# Patient Record
Sex: Female | Born: 1985 | Race: White | Hispanic: No | Marital: Married | Smoking: Never smoker
Health system: Southern US, Community
[De-identification: ages and names within clinical notes are randomized; demographics above are authoritative.]

## PROBLEM LIST (undated history)

## (undated) DIAGNOSIS — E785 Hyperlipidemia, unspecified: Secondary | ICD-10-CM

## (undated) DIAGNOSIS — I10 Essential (primary) hypertension: Secondary | ICD-10-CM

## (undated) DIAGNOSIS — F32A Depression, unspecified: Secondary | ICD-10-CM

## (undated) DIAGNOSIS — M549 Dorsalgia, unspecified: Secondary | ICD-10-CM

## (undated) DIAGNOSIS — Z8711 Personal history of peptic ulcer disease: Secondary | ICD-10-CM

## (undated) DIAGNOSIS — F909 Attention-deficit hyperactivity disorder, unspecified type: Secondary | ICD-10-CM

## (undated) DIAGNOSIS — R7303 Prediabetes: Secondary | ICD-10-CM

## (undated) DIAGNOSIS — F419 Anxiety disorder, unspecified: Secondary | ICD-10-CM

## (undated) DIAGNOSIS — R519 Headache, unspecified: Secondary | ICD-10-CM

## (undated) DIAGNOSIS — M2550 Pain in unspecified joint: Secondary | ICD-10-CM

## (undated) DIAGNOSIS — D689 Coagulation defect, unspecified: Secondary | ICD-10-CM

## (undated) DIAGNOSIS — D649 Anemia, unspecified: Secondary | ICD-10-CM

## (undated) DIAGNOSIS — K219 Gastro-esophageal reflux disease without esophagitis: Secondary | ICD-10-CM

## (undated) DIAGNOSIS — K5900 Constipation, unspecified: Secondary | ICD-10-CM

## (undated) HISTORY — DX: Anxiety disorder, unspecified: F41.9

## (undated) HISTORY — DX: Gastro-esophageal reflux disease without esophagitis: K21.9

## (undated) HISTORY — DX: Prediabetes: R73.03

## (undated) HISTORY — DX: Anemia, unspecified: D64.9

## (undated) HISTORY — DX: Pain in unspecified joint: M25.50

## (undated) HISTORY — DX: Personal history of peptic ulcer disease: Z87.11

## (undated) HISTORY — DX: Coagulation defect, unspecified: D68.9

## (undated) HISTORY — PX: OTHER SURGICAL HISTORY: SHX169

## (undated) HISTORY — DX: Constipation, unspecified: K59.00

## (undated) HISTORY — DX: Dorsalgia, unspecified: M54.9

## (undated) HISTORY — DX: Depression, unspecified: F32.A

## (undated) HISTORY — DX: Hyperlipidemia, unspecified: E78.5

---

## 2020-07-16 ENCOUNTER — Encounter (HOSPITAL_COMMUNITY): Payer: Self-pay | Admitting: *Deleted

## 2020-07-16 ENCOUNTER — Other Ambulatory Visit: Payer: Self-pay

## 2020-07-16 ENCOUNTER — Emergency Department (HOSPITAL_COMMUNITY)
Admission: EM | Admit: 2020-07-16 | Discharge: 2020-07-17 | Disposition: A | Discharge status: Home / Self Care | Payer: BC Managed Care – PPO | Attending: Emergency Medicine | Admitting: Emergency Medicine

## 2020-07-16 DIAGNOSIS — I10 Essential (primary) hypertension: Secondary | ICD-10-CM | POA: Diagnosis not present

## 2020-07-16 DIAGNOSIS — R55 Syncope and collapse: Secondary | ICD-10-CM | POA: Diagnosis not present

## 2020-07-16 DIAGNOSIS — F17200 Nicotine dependence, unspecified, uncomplicated: Secondary | ICD-10-CM | POA: Insufficient documentation

## 2020-07-16 DIAGNOSIS — R4182 Altered mental status, unspecified: Secondary | ICD-10-CM | POA: Diagnosis present

## 2020-07-16 HISTORY — DX: Essential (primary) hypertension: I10

## 2020-07-16 NOTE — ED Triage Notes (Signed)
The pts husband has arrived his story is different from the paramedic that gave me report  The husbnad is reporting that there was never any pot smoking  He reports that the y were havng drinks outside when the pt just stopped talking  And is not talking to him   She just looks at him and blinks her eys at his request  She has never had these symptoms previously   She still does not talk or move her arms and legs  lmp now

## 2020-07-16 NOTE — ED Triage Notes (Signed)
Pt arrived by gems from a parking lot  The pt and her husband had dinner and drinks  Then went to their car  And smoked pot  Ems was called because the pt started acting strange   The paramedic  Reports  That the pt is non-verbal and she has indicated that the pt is having  Lt sided chest pain cbg 127 iv per ems  The pt would not open her lips fot an oral temp  Ems  Reports that   The pt cannot move her legs and arms  No questions answered

## 2020-07-16 NOTE — ED Triage Notes (Signed)
Husband supposed to be coming

## 2020-07-16 NOTE — ED Triage Notes (Signed)
Has had the covid vaccine

## 2020-07-17 ENCOUNTER — Emergency Department (HOSPITAL_COMMUNITY): Payer: BC Managed Care – PPO

## 2020-07-17 ENCOUNTER — Emergency Department (HOSPITAL_BASED_OUTPATIENT_CLINIC_OR_DEPARTMENT_OTHER): Payer: BC Managed Care – PPO

## 2020-07-17 DIAGNOSIS — R202 Paresthesia of skin: Secondary | ICD-10-CM

## 2020-07-17 DIAGNOSIS — F17200 Nicotine dependence, unspecified, uncomplicated: Secondary | ICD-10-CM | POA: Diagnosis not present

## 2020-07-17 DIAGNOSIS — R4182 Altered mental status, unspecified: Secondary | ICD-10-CM | POA: Diagnosis present

## 2020-07-17 DIAGNOSIS — R55 Syncope and collapse: Secondary | ICD-10-CM | POA: Diagnosis not present

## 2020-07-17 DIAGNOSIS — I10 Essential (primary) hypertension: Secondary | ICD-10-CM | POA: Diagnosis not present

## 2020-07-17 LAB — CBC
HCT: 44.4 % (ref 36.0–46.0)
Hemoglobin: 14.1 g/dL (ref 12.0–15.0)
MCH: 28.4 pg (ref 26.0–34.0)
MCHC: 31.8 g/dL (ref 30.0–36.0)
MCV: 89.3 fL (ref 80.0–100.0)
Platelets: 367 10*3/uL (ref 150–400)
RBC: 4.97 MIL/uL (ref 3.87–5.11)
RDW: 15.6 % — ABNORMAL HIGH (ref 11.5–15.5)
WBC: 10.7 10*3/uL — ABNORMAL HIGH (ref 4.0–10.5)
nRBC: 0 % (ref 0.0–0.2)

## 2020-07-17 LAB — TROPONIN I (HIGH SENSITIVITY)
Troponin I (High Sensitivity): <2 ng/L (ref <18)
Troponin I (High Sensitivity): 5 ng/L (ref <18)

## 2020-07-17 LAB — BASIC METABOLIC PANEL
Anion gap: 15 (ref 5–15)
BUN: 8 mg/dL (ref 6–20)
CO2: 20 mmol/L — ABNORMAL LOW (ref 22–32)
Calcium: 9.7 mg/dL (ref 8.9–10.3)
Chloride: 103 mmol/L (ref 98–111)
Creatinine, Ser: 1.26 mg/dL — ABNORMAL HIGH (ref 0.44–1.00)
GFR, Estimated: 57 mL/min — ABNORMAL LOW (ref >60)
Glucose, Bld: 86 mg/dL (ref 70–99)
Potassium: 3.3 mmol/L — ABNORMAL LOW (ref 3.5–5.1)
Sodium: 138 mmol/L (ref 135–145)

## 2020-07-17 LAB — URINALYSIS, ROUTINE W REFLEX MICROSCOPIC: RBC / HPF: >50 RBC/hpf — ABNORMAL HIGH (ref 0–5)

## 2020-07-17 LAB — RAPID URINE DRUG SCREEN, HOSP PERFORMED
Amphetamines: NOT DETECTED
Barbiturates: NOT DETECTED
Benzodiazepines: NOT DETECTED
Cocaine: NOT DETECTED
Opiates: NOT DETECTED
Tetrahydrocannabinol: POSITIVE — AB

## 2020-07-17 LAB — MAGNESIUM: Magnesium: 1.9 mg/dL (ref 1.7–2.4)

## 2020-07-17 LAB — I-STAT BETA HCG BLOOD, ED (MC, WL, AP ONLY): I-stat hCG, quantitative: <5 m[IU]/mL (ref <5)

## 2020-07-17 LAB — ACETAMINOPHEN LEVEL: Acetaminophen (Tylenol), Serum: <10 ug/mL — ABNORMAL LOW (ref 10–30)

## 2020-07-17 LAB — HEPATIC FUNCTION PANEL
ALT: 23 U/L (ref 0–44)
AST: 24 U/L (ref 15–41)
Albumin: 3.8 g/dL (ref 3.5–5.0)
Alkaline Phosphatase: 84 U/L (ref 38–126)
Bilirubin, Direct: 0.3 mg/dL — ABNORMAL HIGH (ref 0.0–0.2)
Indirect Bilirubin: 0.3 mg/dL (ref 0.3–0.9)
Total Bilirubin: 0.6 mg/dL (ref 0.3–1.2)
Total Protein: 7.1 g/dL (ref 6.5–8.1)

## 2020-07-17 LAB — D-DIMER, QUANTITATIVE: D-Dimer, Quant: 0.59 ug/mL-FEU — ABNORMAL HIGH (ref 0.00–0.50)

## 2020-07-17 LAB — ETHANOL: Alcohol, Ethyl (B): <10 mg/dL (ref <10)

## 2020-07-17 LAB — SALICYLATE LEVEL: Salicylate Lvl: <7 mg/dL — ABNORMAL LOW (ref 7.0–30.0)

## 2020-07-17 MED ORDER — SODIUM CHLORIDE 0.9 % IV BOLUS
1000.0000 mL | Freq: Once | INTRAVENOUS | Status: AC
  Administered 2020-07-17: 1000 mL via INTRAVENOUS

## 2020-07-17 MED ORDER — IOHEXOL 350 MG/ML SOLN
75.0000 mL | Freq: Once | INTRAVENOUS | Status: AC | PRN
  Administered 2020-07-17: 75 mL via INTRAVENOUS

## 2020-07-17 NOTE — Progress Notes (Signed)
VASCULAR LAB    Left lower extremity venous duplex has been performed.  See CV proc for preliminary results.  Gave verbal report to [employer], PA-C  Shelanda Duvall, RVT 07/17/2020, 12:33 PM

## 2020-07-17 NOTE — ED Provider Notes (Signed)
[facility] EMERGENCY DEPARTMENT Provider Note   CSN: [id number] Arrival date & time: 07/16/20  2244     History Chief Complaint  Patient presents with  . Altered Mental Status    Helen Wells is a 35 y.o. female who presents after syncopal episode last night.  Primary concern is patient is nonverbal at this time, points to her head and typed on her phone that she knows what she wants to say, but cannot make her mouth form the words. She is able to communicate clearly through the phone. Collateral history obtained by her husband at the bedside.  He reports that they went out last night to celebrate New Year's Eve, she had 2 drinks, and when they were walking to the car to go home she collapsed suddenly.  Patient was caught by a friend who was standing by, was lowered to the pavement.  Per husband she was unconscious for about a minute prior to EMS arrival.  Since that time she has been unable to speak.  She types on her phone that she has "fuzzy vision", feels that her vision is blurry.  EMS report that patient was smoking marijuana, however this was denied by patient's husband.  Patient does endorse left-sided chest pain when she woke up following her syncopal episode.  Initially patient was unable to move all 4 extremities, was "acting strange".  Patient will follow commands, however is unable to speak.  Patient denies abdominal pain but does endorse some nausea.  Denies emesis, diarrhea.  Patient is currently on her menstrual cycle.  She has an IUD in place.  I personally reviewed the patient's medical records.  She is history of migraines, L5 surgery for herniated disc, history of spinal stenosis.  Patient has history of anti-cardiolipin antibody, following with rheumatology.  She endorses history of syncope secondary to dehydration in the past.  Patient denies recent prolonged travel, immobilization, she does have history of surgery in November 2021,  approximately 1 month ago.  Patient with hormonal IUD.  No history of blood clot, no history of malignancy.  HPI     Past Medical History:  Diagnosis Date  . Hypertension     There are no problems to display for this patient.      OB History   No obstetric history on file.     No family history on file.  Social History   Tobacco Use  . Smoking status: Current Every Day Smoker  . Smokeless tobacco: Never Used  Substance Use Topics  . Alcohol use: Yes    Home Medications Prior to Admission medications   Not on File    Allergies    Patient has no known allergies.  Review of Systems   Review of Systems  Constitutional: Negative.   HENT: Negative.   Eyes: Positive for visual disturbance. Negative for photophobia and pain.  Respiratory: Positive for chest tightness. Negative for cough, shortness of breath and wheezing.   Cardiovascular: Positive for chest pain. Negative for palpitations and leg swelling.  Gastrointestinal: Negative for abdominal pain, constipation, diarrhea, nausea and vomiting.  Genitourinary: Positive for hematuria and vaginal bleeding. Negative for dysuria.       Currently on her period  Neurological: Positive for syncope, weakness and headaches. Negative for light-headedness.       Unable to speak at this time    Physical Exam Updated Vital Signs BP 133/85   Pulse 79   Temp 99 F (37.2 C) (Oral)   Resp  15   Ht 5\' 3"  (1.6 m)   Wt 99.8 kg   LMP 07/16/2020   SpO2 98%   BMI 38.97 kg/m   Physical Exam Vitals and nursing note reviewed.  HENT:     Head: Normocephalic and atraumatic.     Nose: Nose normal.     Mouth/Throat:     Mouth: Mucous membranes are moist.     Palate: No mass.     Pharynx: Oropharynx is clear. Uvula midline. No oropharyngeal exudate or posterior oropharyngeal erythema.     Tonsils: No tonsillar exudate.  Eyes:     General: Vision grossly intact.        Right eye: No discharge.        Left eye: No discharge.      Extraocular Movements: Extraocular movements intact.     Conjunctiva/sclera: Conjunctivae normal.     Pupils: Pupils are equal, round, and reactive to light.     Visual Fields: Right eye visual fields normal and left eye visual fields normal.  Neck:     Trachea: Trachea normal.  Cardiovascular:     Rate and Rhythm: Normal rate and regular rhythm.     Pulses: Normal pulses.          Radial pulses are 2+ on the right side and 2+ on the left side.       Dorsalis pedis pulses are 2+ on the right side and 2+ on the left side.     Heart sounds: Normal heart sounds. No murmur heard.   Pulmonary:     Effort: Pulmonary effort is normal. No prolonged expiration or respiratory distress.     Breath sounds: Examination of the right-lower field reveals rales. Rales present. No wheezing or rhonchi.  Chest:     Chest wall: No lacerations, deformity, swelling, tenderness, crepitus or edema.  Abdominal:     General: Bowel sounds are normal. There is no distension.     Palpations: Abdomen is soft.     Tenderness: There is no abdominal tenderness. There is no guarding or rebound.  Musculoskeletal:        General: No deformity.     Cervical back: Neck supple. No rigidity or crepitus. No pain with movement, spinous process tenderness or muscular tenderness.     Right lower leg: No edema.     Left lower leg: No edema.     Comments: 4/5 grip strength bilaterally.  4/5 plantar - and dorsiflexion bilaterally Patient moving all 4 extremities independently and without difficulty.  Lymphadenopathy:     Cervical: No cervical adenopathy.  Skin:    General: Skin is warm and dry.     Capillary Refill: Capillary refill takes less than 2 seconds.  Neurological:     General: No focal deficit present.     Mental Status: She is alert and oriented to person, place, and time.     Cranial Nerves: Cranial nerves are intact.     Sensory: Sensation is intact.     Motor: Motor function is intact.     Coordination:  Coordination is intact.     Gait: Gait is intact.     Deep Tendon Reflexes:     Reflex Scores:      Bicep reflexes are 1+ on the right side and 1+ on the left side.      Patellar reflexes are 1+ on the right side and 1+ on the left side.    Comments: Patient remains nonverbal at this time, however is  able to communicate via typing phrases out on her phone.   Psychiatric:        Mood and Affect: Mood normal. Affect is flat.     ED Results / Procedures / Treatments   Labs (all labs ordered are listed, but only abnormal results are displayed) Labs Reviewed  BASIC METABOLIC PANEL - Abnormal; Notable for the following components:      Result Value   Potassium 3.3 (*)    CO2 20 (*)    Creatinine, Ser 1.26 (*)    GFR, Estimated 57 (*)    All other components within normal limits  CBC - Abnormal; Notable for the following components:   WBC 10.7 (*)    RDW 15.6 (*)    All other components within normal limits  URINALYSIS, ROUTINE W REFLEX MICROSCOPIC - Abnormal; Notable for the following components:   Color, Urine RED (*)    APPearance TURBID (*)    Glucose, UA   (*)    Value: TEST NOT REPORTED DUE TO COLOR INTERFERENCE OF URINE PIGMENT   Hgb urine dipstick   (*)    Value: TEST NOT REPORTED DUE TO COLOR INTERFERENCE OF URINE PIGMENT   Bilirubin Urine   (*)    Value: TEST NOT REPORTED DUE TO COLOR INTERFERENCE OF URINE PIGMENT   Ketones, ur   (*)    Value: TEST NOT REPORTED DUE TO COLOR INTERFERENCE OF URINE PIGMENT   Protein, ur   (*)    Value: TEST NOT REPORTED DUE TO COLOR INTERFERENCE OF URINE PIGMENT   Nitrite   (*)    Value: TEST NOT REPORTED DUE TO COLOR INTERFERENCE OF URINE PIGMENT   Leukocytes,Ua   (*)    Value: TEST NOT REPORTED DUE TO COLOR INTERFERENCE OF URINE PIGMENT   RBC / HPF >50 (*)    Bacteria, UA FEW (*)    All other components within normal limits  RAPID URINE DRUG SCREEN, HOSP PERFORMED - Abnormal; Notable for the following components:    Tetrahydrocannabinol POSITIVE (*)    All other components within normal limits  ACETAMINOPHEN LEVEL - Abnormal; Notable for the following components:   Acetaminophen (Tylenol), Serum <10 (*)    All other components within normal limits  SALICYLATE LEVEL - Abnormal; Notable for the following components:   Salicylate Lvl <7.0 (*)    All other components within normal limits  HEPATIC FUNCTION PANEL - Abnormal; Notable for the following components:   Bilirubin, Direct 0.3 (*)    All other components within normal limits  D-DIMER, QUANTITATIVE (NOT AT [facility]) - Abnormal; Notable for the following components:   D-Dimer, Quant 0.59 (*)    All other components within normal limits  MAGNESIUM  ETHANOL  I-STAT BETA HCG BLOOD, ED ([facility], [facility], [facility] ONLY)  TROPONIN I (HIGH SENSITIVITY)  TROPONIN I (HIGH SENSITIVITY)    EKG EKG Interpretation  Date/Time:  Friday July 16 2020 22:45:35 EST Ventricular Rate:  112 PR Interval:  188 QRS Duration: 104 QT Interval:  328 QTC Calculation: 447 R Axis:   63 Text Interpretation: Sinus tachycardia with occasional Premature ventricular complexes T wave abnormality, consider inferior ischemia Abnormal ECG Confirmed by Tilden Fossa [phone]) on 07/17/2020 8:17:44 AM   Radiology CT Head Wo Contrast  Result Date: 07/17/2020 CLINICAL DATA:  35 year old female with altered mental status, apparent bilateral weakness after alcohol and marijuana use. EXAM: CT HEAD WITHOUT CONTRAST TECHNIQUE: Contiguous axial images were obtained from the base of the skull through the vertex  without intravenous contrast. COMPARISON:  None. FINDINGS: Brain: Partially empty sella. Normal cerebral volume. No midline shift, ventriculomegaly, mass effect, evidence of mass lesion, intracranial hemorrhage or evidence of cortically based acute infarction. Gray-white matter differentiation is within normal limits throughout the brain. Vascular: No hyperdense vessel or unexpected calcification.  Skull: Negative. Sinuses/Orbits: Visualized paranasal sinuses and mastoids are clear. Other: Visualized orbits and scalp soft tissues are within normal limits. IMPRESSION: Negative noncontrast CT appearance of the brain. Electronically Signed   By: Odessa Fleming M.D.   On: 07/17/2020 09:42   CT Angio Chest PE W/Cm &/Or Wo Cm  Result Date: 07/17/2020 CLINICAL DATA:  35 year old female with altered mental status, loss of speech, bilateral weakness after alcohol use. EXAM: CT ANGIOGRAPHY CHEST WITH CONTRAST TECHNIQUE: Multidetector CT imaging of the chest was performed using the standard protocol during bolus administration of intravenous contrast. Multiplanar CT image reconstructions and MIPs were obtained to evaluate the vascular anatomy. CONTRAST:  84mL OMNIPAQUE IOHEXOL 350 MG/ML SOLN COMPARISON:  Cervical spine CT, portable chest x-ray today. FINDINGS: Cardiovascular: Good contrast bolus timing in the pulmonary arterial tree. No focal filling defect identified in the pulmonary arteries to suggest acute pulmonary embolism. Cardiac size within normal limits. No pericardial effusion. Negative visible aorta. No calcified coronary artery atherosclerosis is evident. Proximal great vessels, proximal vertebral arteries appear to be patent. Mediastinum/Nodes: Negative. Lungs/Pleura: Major airways are patent. Low normal lung volumes with minor lung base atelectasis. Mild elevation of the right hemidiaphragm appears to be normal variation. No pneumothorax or pleural effusion. Upper Abdomen: Absent gallbladder. Negative visible liver, spleen, pancreas, left adrenal gland, kidneys and bowel in the upper abdomen. Small and relatively low-density adrenal nodule is about 10 mm and too small to characterize but benign adrenal adenoma is favored. Musculoskeletal: Negative. Review of the MIP images confirms the above findings. IMPRESSION: 1. Negative for acute pulmonary embolus. Unremarkable CTA appearance of the chest. 2. Small benign  right adrenal adenoma suspected. Electronically Signed   By: Odessa Fleming M.D.   On: 07/17/2020 10:45   CT Cervical Spine Wo Contrast  Result Date: 07/17/2020 CLINICAL DATA:  35 year old female with altered mental status, apparent bilateral weakness after alcohol and marijuana use. EXAM: CT CERVICAL SPINE WITHOUT CONTRAST TECHNIQUE: Multidetector CT imaging of the cervical spine was performed without intravenous contrast. Multiplanar CT image reconstructions were also generated. COMPARISON:  Head CT today. FINDINGS: Alignment: Straightening of cervical lordosis. Cervicothoracic junction alignment is within normal limits. Bilateral posterior element alignment is within normal limits. Skull base and vertebrae: Visualized skull base is intact. No atlanto-occipital dissociation. Bone mineralization is within normal limits. No osseous abnormality identified. Soft tissues and spinal canal: No prevertebral fluid or swelling. No visible canal hematoma. Negative visible noncontrast neck soft tissues. Disc levels:  No degenerative changes. Upper chest: Negative; mild respiratory motion in the upper lungs. Other: Negative visible posterior fossa. IMPRESSION: Negative noncontrast CT appearance of the cervical spine. Electronically Signed   By: Odessa Fleming M.D.   On: 07/17/2020 09:44   DG Chest Portable 1 View  Result Date: 07/17/2020 CLINICAL DATA:  Rales EXAM: PORTABLE CHEST 1 VIEW COMPARISON:  None. FINDINGS: Normal heart size. Normal mediastinal contour. No pneumothorax. No pleural effusion. Lungs appear clear, with no acute consolidative airspace disease and no pulmonary edema. IMPRESSION: No active disease. Electronically Signed   By: Delbert Phenix M.D.   On: 07/17/2020 09:14   VAS [place] LOWER EXTREMITY VENOUS (DVT) ([facility] and [facility] 7a-7p)  Result Date: 07/17/2020  Lower Venous DVT Study Indications: Left leg numbness.  Comparison Study: No prior study Performing Technologist: Sherren Kerns RVS  Examination Guidelines: A complete  evaluation includes B-mode imaging, spectral Doppler, color Doppler, and power Doppler as needed of all accessible portions of each vessel. Bilateral testing is considered an integral part of a complete examination. Limited examinations for reoccurring indications may be performed as noted. The reflux portion of the exam is performed with the patient in reverse Trendelenburg.  +-----+---------------+---------+-----------+----------+--------------+ RIGHTCompressibilityPhasicitySpontaneityPropertiesThrombus Aging +-----+---------------+---------+-----------+----------+--------------+ CFV  Full           Yes      Yes                                 +-----+---------------+---------+-----------+----------+--------------+   +---------+---------------+---------+-----------+----------+--------------+ LEFT     CompressibilityPhasicitySpontaneityPropertiesThrombus Aging +---------+---------------+---------+-----------+----------+--------------+ CFV      Full           Yes      Yes                                 +---------+---------------+---------+-----------+----------+--------------+ SFJ      Full                                                        +---------+---------------+---------+-----------+----------+--------------+ FV Prox  Full                                                        +---------+---------------+---------+-----------+----------+--------------+ FV Mid   Full                                                        +---------+---------------+---------+-----------+----------+--------------+ FV DistalFull                                                        +---------+---------------+---------+-----------+----------+--------------+ PFV      Full                                                        +---------+---------------+---------+-----------+----------+--------------+ POP      Full           Yes      Yes                                  +---------+---------------+---------+-----------+----------+--------------+ PTV      Full                                                        +---------+---------------+---------+-----------+----------+--------------+  PERO     Full                                                        +---------+---------------+---------+-----------+----------+--------------+     Summary: RIGHT: - No evidence of common femoral vein obstruction.  LEFT: - There is no evidence of deep vein thrombosis in the lower extremity.  *See table(s) above for measurements and observations.    Preliminary     Procedures Procedures (including critical care time)  Medications Ordered in ED Medications  sodium chloride 0.9 % bolus 1,000 mL (0 mLs Intravenous Stopped 07/17/20 1200)  iohexol (OMNIPAQUE) 350 MG/ML injection 75 mL (75 mLs Intravenous Contrast Given 07/17/20 1018)    ED Course  I have reviewed the triage vital signs and the nursing notes.  Pertinent labs & imaging results that were available during my care of the patient were reviewed by me and considered in my medical decision making (see chart for details).    MDM Rules/Calculators/A&P                         35 year old female who presents following syncopal episode last night, now nonverbal, unable to speak since her syncopal episode.  History of of anticardiolipin antibody syndrome, spinal stenosis, recent spinal surgery for herniated disc in 05/2020.  The differential for syncope is extensive and includes, but is not limited to: arrythmia (Vtach, SVT, SSS, sinus arrest, AV block, bradycardia), aortic stenosis, AMI, hypertrophic obstructive cardiomyopathy (HOCM), PE, atrial myxoma, pulmonary hypertension, orthostatic hypotension, hypovolemia, drug effect, GB syndrome, micturition, cough, carotid sinus sensitivity, seizure, TIA/CVA, hypoglycemia, vertigo.   Patient tachycardic on intake, remains tachycardic at this time, hypertensive a  138/109.    Physical exam is very reassuring.  Neurologic exam is nonfocal, with the exception of the patient is unable to speak.  She is able to communicate clearly by typing information out on her phone.  She is able to follow commands.  Cardiopulmonary exam is significant only for mild rales in the right lung base.  Patient is not tachycardic at the time of my exam.  Basic labs were obtained in triage, CBC with mild leukocytosis of 10.7, BMP with mild hypokalemia 3.3.  Mild AKI with creatinine of 1.26, no prior value in the system.  Patient is not pregnant.  Initial troponin negative, <2, Delta trop negative, 5.  Given history of syncopal episode, in context of anticardiolipin antibody syndrome, will proceed with D-dimer in addition to substance use labs, magnesium, UA.  We will proceed with chest x-ray, CT head CT cervical spine.  Pending D-dimer may proceed with CT a of the chest as well.  Anxiolytic offered, patient declined at this time.  EKG with sinus tachycardia with PVCs, T wave abnormality, consider inferior ischemia , no STEMI.  CT head, cervical spine negative for acute abnormality.  D-dimer elevated to 0.59, will proceed with CT PE study as well as venous Doppler of the lower extremities.  UDS positive for tetrahydrocannabinoids.  CT PE study negative for acute PE.  Venous Doppler negative for DVT negative bilaterally.  At time of my reevaluation of the patient she is able to speak in full sentences once again, is no longer feeling anxious.  She has ambulated independently to and from the restroom without difficulty.  She  is feeling ready to go home.  Case discussed at length with attending physician, who agrees with plan for discharge home with close PCP follow-up.  Transient mutism likely secondary to stress response, possible conversion disorder.  Will provide contact information for mental health providers as well.  Given reassuring physical exam, laboratory studies, CT  imaging, EKG, and vital signs, no further work-up is warranted in the emergency department at this time.  Recommend very close PCP follow-up.  Helen Wells voiced understanding of her medical evaluation and treatment plan.  Each of her questions were answered to her expressed satisfaction.  Strict return precautions were given.  Patient is well-appearing, stable, and appropriate discharge at this time.  This chart was dictated using voice recognition software, Dragon. Despite the best efforts of this provider to proofread and correct errors, errors may still occur which can change documentation meaning.  Final Clinical Impression(s) / ED Diagnoses Final diagnoses:  Syncope and collapse    Rx / DC Orders ED Discharge Orders    None       Sherrilee GillesSponseller, Jonathen Rathman R, PA-C 07/17/20 1635    Tilden Fossaees, Elizabeth, MD 07/17/20 [phone]

## 2020-07-17 NOTE — Discharge Instructions (Addendum)
You were evaluated in the emergency department today for syncope.  Your physical exam, vital signs, blood work, CT scans, and EKG were all very reassuring.  There are no signs of heart attack or blood clot in your lungs.  No signs of large infections either.  I suspect that your inability to speak for several hours this morning was what is called a "stress response".  This is a psychological response to a physical stressor.  Your neurologic exam, the examination of your brain, is normal, as are your CT scans of your head and your neck.  Recommend you follow-up with your primary care doctor within the next week for reevaluation and monitoring.  Additionally below is contact information for several different counseling providers.  Their specialty will be the most helpful in preventing any recurrence of such a stress response.  Return the emergency department if you develop any chest pain, difficulty breathing, nausea or vomiting that does not stop, or if you pass out again, or develop any other new severe symptoms.     Outpatient Psychiatry and Counseling  [facility]: Mobile Crisis Management 24 hours:  [phone]  [facility] of the [employer] sliding scale fee and walk in schedule: M-F 8am-12pm/1pm-3pm [address]  [place], [place] [address] [phone]  [facility] [address] [place], [place] [address] [phone]  [facility] (Former[facility] [employer])- new patient walk-in appointments available Monday - Friday 8am -3pm.          [address] [place], [place] [address] [phone] or crisis line- [phone]  [facility] Outpatient Services/ Intensive Outpatient Therapy Program [address] [place], [place] [address] [phone]  [facility]                  Crisis Services      [phone] [address]     [place], [place] [address]                 [place]  [facility]   [facility] [phone]. [address] [place], [place] [address]   [employer] of Care          [address]  [place], [place] [address]       [phone]  [facility] [address] [place], [place] [address] [phone]  [facility]    [address]    [place], [place] [address]     [phone]       Andee Poles, MD     [address]     [place] [address]     [phone]       [facility] [address] [place] [address]  [facility]     [address] [place], [place]      [phone]       [facility] Eulogio Ditch, MD [address] [place], [place] [address] [phone]  [facility]     [address]     [place], [place] [address]     [phone]       [facility] [address] [place], [place] [address] [phone] Resources for Temporary Residential Assistance/Crisis Centers  [facility]) M-F 8am-3pm   [address] [place], [place] [address]  [phone] Services include: laundry, barbering, support groups, case management, phone  & computer access, showers, AA/NA mtgs, mental health/substance abuse nurse, job skills class, disability information, VA assistance, spiritual classes, etc.   HOMELESS SHELTERS  [facility], [place]     [facility]   [address] [place]     [phone]              [employer] (women and children)       [address] [place], [place] [address] [phone] [email or web address] for application and process Application Required  [facility] [employer] [facility]   [address]    [place] [address]     [phone]                    [facility] [address] [place], [place]  [address] [phone] application appt.) Application Required  [facility] (women only)    [address]     [place], [place] [address]     [phone]      Intake starts 6pm daily Need valid ID, SSC, & Police report [employer] [address] [place], [place] [phone] Application Required  [employer] (men only)     [address]      [place], [place]     [phone]       [facility] [place] (Pregnant women only) [address] [place], [place] [phone]  [facility]      [address]      [place], [place] [address]     [phone]             [facility] [address] [place], [place] [phone] 90 day commitment/SA/Application process  [facility](men only)     [address]     [place], [place]     [phone]       Check-in at [facility] [address] [place], [place] [address] [phone] Men/Women/Women and Children must be there by 7 pm  [facility] [place], [place] [phone]

## 2020-07-17 NOTE — ED Notes (Signed)
Pt transported to US

## 2020-07-17 NOTE — ED Notes (Addendum)
Pt is able to stand, bear weight with 2x assist, and pivot to bed. Pt is able to make gestures for locations of pain, pain level, height and weight.   Pt gestures that she has vision issues; "fuzzy" per husband( communicated through typing messages on their phone). PEARLA. Peripheral vision intact.   Husband reports recent L5 surgery for herniated disc. Husband also reports hx of migraines.   Pt types "IgM blood clotting disorder" for other pertinent medical hx

## 2020-07-17 NOTE — ED Notes (Signed)
Pt d/c home per MD order. Discharge summary reviewed with pt, pt verbalizes understanding. No s/s of acute distress noted at discharge. Ambulatory off unit. Discharged home with visitor.  

## 2021-08-31 ENCOUNTER — Ambulatory Visit
Admission: EM | Admit: 2021-08-31 | Discharge: 2021-08-31 | Disposition: A | Discharge status: Home / Self Care | Payer: Self-pay

## 2021-08-31 ENCOUNTER — Other Ambulatory Visit: Payer: Self-pay

## 2021-08-31 DIAGNOSIS — M5442 Lumbago with sciatica, left side: Secondary | ICD-10-CM

## 2021-08-31 HISTORY — DX: Essential (primary) hypertension: I10

## 2021-08-31 MED ORDER — CYCLOBENZAPRINE HCL 5 MG PO TABS: 5.0000 mg | ORAL_TABLET | Freq: Two times a day (BID) | ORAL | 0 refills | Status: DC | PRN

## 2021-08-31 MED ORDER — KETOROLAC TROMETHAMINE 30 MG/ML IJ SOLN
30.0000 mg | Freq: Once | INTRAMUSCULAR | Status: AC
  Administered 2021-08-31: 30 mg via INTRAMUSCULAR

## 2021-08-31 MED ORDER — PREDNISONE 10 MG (21) PO TBPK: ORAL_TABLET | Freq: Every day | ORAL | 0 refills | Status: DC

## 2021-08-31 NOTE — ED Triage Notes (Signed)
Pt c/o back surgery that resulted in scar tissue sending her to the pain clinic where she receives steroid injections. States she is unable to be seen at pain clinic recently and is currently having "shaking pain" right now.   States last and first pain clinic was in December.

## 2021-08-31 NOTE — Discharge Instructions (Signed)
You have been prescribed prednisone steroid and a muscle relaxer to help alleviate your pain.  Please follow-up with pain clinic for further evaluation and management.  You may also alternate ice and heat application to affected area.  Please be advised that muscle relaxer can cause drowsiness.

## 2021-08-31 NOTE — ED Provider Notes (Signed)
[facility]    CSN: [id number] Arrival date & time: 08/31/21  1700      History   Chief Complaint Chief Complaint  Patient presents with   Back Pain    HPI Helen Wells is a 36 y.o. female.   Patient presents with left lower back pain that radiates down left posterior leg that has been present for a couple of days.  Patient reports that she has a history of chronic back pain and is followed by pain clinic.  She had a laminectomy and discectomy 1 to 2 years ago.  She reports that she "ripped her scar tissue" in October 2022 by bending over.  She was referred to pain clinic after this occurred and has been getting periodic epidural steroid injections.  She last received epidural steroid injection in December 2022 but has had a flareup over the past few days.  She is not able to see pain clinic until April 2023.  She has been taking over-the-counter pain relievers and previously prescribed Norco with no improvement in pain.  Pain is exacerbated with movement.  Denies urinary frequency, urinary burning, urinary or bowel incontinence, saddle anesthesia.  Denies any apparent injury.   Back Pain  Past Medical History:  Diagnosis Date   Hypertension     There are no problems to display for this patient.   History reviewed. No pertinent surgical history.  OB History   No obstetric history on file.      Home Medications    Prior to Admission medications   Medication Sig Start Date End Date Taking? Authorizing Provider  cyclobenzaprine (FLEXERIL) 5 MG tablet Take 1 tablet (5 mg total) by mouth 2 (two) times daily as needed for muscle spasms. 08/31/21  Yes Keeton Kassebaum, Rolly Salter E, FNP  predniSONE (STERAPRED UNI-PAK 21 TAB) 10 MG (21) TBPK tablet Take by mouth daily. Take 6 tabs by mouth daily  for 2 days, then 5 tabs for 2 days, then 4 tabs for 2 days, then 3 tabs for 2 days, 2 tabs for 2 days, then 1 tab by mouth daily for 2 days 08/31/21  Yes Jamesmichael Shadd, Cody E, FNP   amLODipine (NORVASC) 5 MG tablet Take 7.5 mg by mouth daily. 06/13/21   [provider]  lisinopril (ZESTRIL) 30 MG tablet Take 30 mg by mouth daily. 06/15/21   [provider]    Family History History reviewed. No pertinent family history.  Social History Social History   Tobacco Use   Smoking status: Never   Smokeless tobacco: Never     Allergies   Erythromycin, Mobic [meloxicam], and Tetracyclines & related   Review of Systems Review of Systems Per HPI  Physical Exam Triage Vital Signs ED Triage Vitals  Enc Vitals Group     BP 08/31/21 1728 125/85     Pulse Rate 08/31/21 1728 72     Resp 08/31/21 1728 20     Temp 08/31/21 1728 97.9 F (36.6 C)     Temp Source 08/31/21 1728 Oral     SpO2 08/31/21 1728 99 %     Weight --      Height --      Head Circumference --      Peak Flow --      Pain Score 08/31/21 1730 10     Pain Loc --      Pain Edu? --      Excl. in GC? --    No data found.  Updated Vital Signs  BP 125/85 (BP Location: Left Arm)    Pulse 72    Temp 97.9 F (36.6 C) (Oral)    Resp 20    SpO2 99%   Visual Acuity Right Eye Distance:   Left Eye Distance:   Bilateral Distance:    Right Eye Near:   Left Eye Near:    Bilateral Near:     Physical Exam Constitutional:      General: She is not in acute distress.    Appearance: Normal appearance. She is not toxic-appearing or diaphoretic.  HENT:     Head: Normocephalic and atraumatic.  Eyes:     Extraocular Movements: Extraocular movements intact.     Conjunctiva/sclera: Conjunctivae normal.  Pulmonary:     Effort: Pulmonary effort is normal.  Musculoskeletal:     Cervical back: Normal.     Thoracic back: Normal.     Lumbar back: Tenderness present. No swelling, edema or bony tenderness. Positive left straight leg raise test. Negative right straight leg raise test.     Comments: No tenderness to palpation.  No direct spinal tenderness, crepitus, step-off.  Pain occurs with  range of motion per patient.  Neurological:     General: No focal deficit present.     Mental Status: She is alert and oriented to person, place, and time. Mental status is at baseline.     Deep Tendon Reflexes: Reflexes are normal and symmetric.  Psychiatric:        Mood and Affect: Mood normal.        Behavior: Behavior normal.        Thought Content: Thought content normal.        Judgment: Judgment normal.     UC Treatments / Results  Labs (all labs ordered are listed, but only abnormal results are displayed) Labs Reviewed - No data to display  EKG   Radiology No results found.  Procedures Procedures (including critical care time)  Medications Ordered in UC Medications  ketorolac (TORADOL) 30 MG/ML injection 30 mg (30 mg Intramuscular Given 08/31/21 1746)    Initial Impression / Assessment and Plan / UC Course  I have reviewed the triage vital signs and the nursing notes.  Pertinent labs & imaging results that were available during my care of the patient were reviewed by me and considered in my medical decision making (see chart for details).     Will prescribe prednisone steroid and Flexeril.  Patient was given ketorolac injection in urgent care.  She reports that she has seen improvement in the past with these medications.  Patient to alternate ice and heat application as well.  Patient to follow-up with pain clinic for further evaluation and management.  Do not think that back imaging is necessary at this time given no obvious injury, this being a chronic pain issue, and no direct spinal tenderness.  Discussed return precautions.  Patient verbalized understanding and was agreeable with plan. Final Clinical Impressions(s) / UC Diagnoses   Final diagnoses:  Acute left-sided low back pain with left-sided sciatica     Discharge Instructions      You have been prescribed prednisone steroid and a muscle relaxer to help alleviate your pain.  Please follow-up with pain  clinic for further evaluation and management.  You may also alternate ice and heat application to affected area.  Please be advised that muscle relaxer can cause drowsiness.    ED Prescriptions     Medication Sig Dispense Auth. Provider   predniSONE (STERAPRED UNI-PAK  21 TAB) 10 MG (21) TBPK tablet Take by mouth daily. Take 6 tabs by mouth daily  for 2 days, then 5 tabs for 2 days, then 4 tabs for 2 days, then 3 tabs for 2 days, 2 tabs for 2 days, then 1 tab by mouth daily for 2 days 42 tablet [place], [place], [place]   cyclobenzaprine (FLEXERIL) 5 MG tablet Take 1 tablet (5 mg total) by mouth 2 (two) times daily as needed for muscle spasms. 20 tablet [place], [place] E, [place]      I have reviewed the PDMP during this encounter.   Gustavus Bryant, [place] 08/31/21 1750

## 2021-10-04 ENCOUNTER — Ambulatory Visit: Payer: 59 | Admitting: Family Medicine

## 2021-10-04 ENCOUNTER — Other Ambulatory Visit (HOSPITAL_BASED_OUTPATIENT_CLINIC_OR_DEPARTMENT_OTHER): Payer: Self-pay

## 2021-10-04 ENCOUNTER — Encounter: Payer: Self-pay | Admitting: Family Medicine

## 2021-10-04 VITALS — BP 118/74 | HR 101 | Temp 97.9°F | Resp 16 | Ht 63.0 in | Wt 208.0 lb

## 2021-10-04 DIAGNOSIS — M5116 Intervertebral disc disorders with radiculopathy, lumbar region: Secondary | ICD-10-CM | POA: Diagnosis not present

## 2021-10-04 DIAGNOSIS — D509 Iron deficiency anemia, unspecified: Secondary | ICD-10-CM | POA: Diagnosis not present

## 2021-10-04 DIAGNOSIS — K921 Melena: Secondary | ICD-10-CM

## 2021-10-04 DIAGNOSIS — I10 Essential (primary) hypertension: Secondary | ICD-10-CM | POA: Insufficient documentation

## 2021-10-04 MED ORDER — AMLODIPINE BESYLATE 5 MG PO TABS
5.0000 mg | ORAL_TABLET | Freq: Every day | ORAL | 2 refills | Status: DC
  Filled 2021-10-04 – 2021-12-06 (×4): qty 90, 90d supply, fill #0
  Filled 2022-03-06: qty 90, 90d supply, fill #1
  Filled 2022-06-20: qty 90, 90d supply, fill #2

## 2021-10-04 MED ORDER — LISINOPRIL 30 MG PO TABS
30.0000 mg | ORAL_TABLET | Freq: Every day | ORAL | 2 refills | Status: DC
  Filled 2021-10-04 – 2021-12-06 (×2): qty 90, 90d supply, fill #0
  Filled 2022-03-06: qty 90, 90d supply, fill #1

## 2021-10-04 NOTE — Patient Instructions (Addendum)
Give [place] 2-3 business days to get the results of your labs back.  ? ?Take an over the counter iron supplement (Ferrous sulfate) 1-2 times daily as your stomach will allow ? ?Avoid NSAIDs.  ? ?Call Center for [facility] at [phone] for an appointment. ? ?They are located at [address], [place], [place], [address] (right across the hall from our office). ? ?If you do not hear anything about your referral in the next 1-2 weeks, call our office and ask for an update. ? ?Let us know if you need anything. ?

## 2021-10-04 NOTE — Progress Notes (Signed)
Chief Complaint  ?Patient presents with  ? Establish Care  ? ? ?   New Patient Visit ?SUBJECTIVE: ?HPI: Helen Wells is an 36 y.o.female who is being seen for establishing care. ? ?The patient was previously seen at [facility]. ? ?Hypertension ?Patient presents for hypertension follow up. ?She does monitor home blood pressures. ?Blood pressures ranging on average from 110's/70-80's. ?She is compliant with medications- Norvasc 5 mg/d, lisinopril 30 mg/d. ?Patient has these side effects of medication: none ?She is adhering to a healthy diet overall. ?Exercise: none ?No CP or SOB.  ? 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  She had to stop taking meloxicam in the past.  She was placed on Relafen by her neurosurgery team's office.  She took it for 2 weeks and started to have reflux symptoms and dark tarry stools.  She stopped and her stools are starting to normalize but she has been having dizziness and fatigue.  This seems reminiscent of previous iron deficiency anemia in the past for her.  She was told she was very close to needing some sort of infusion/transfusion but does not remember which. ? ?Patient has a history of a herniated disc.  She is receiving injections from her neurosurgery team which was helping prolong surgery.  After getting new insurance, she needs a team in this area to help.  She does not have any weakness but is having chronic left foot numbness. ? ?Past Medical History:  ?Diagnosis Date  ? Anemia   ? Anemia   ? Anxiety   ? Clotting disorder (HCC)   ? Hyperlipemia   ? Hypertension   ? Hypertension   ? ?Past Surgical History:  ?Procedure Laterality Date  ? Gallbladder Removal 2017    ? lancinectomy and Discectomy 2021    ? Dr Orlie Pollen - Neurosurgeon  ? ? ?Family History  ?Problem Relation Age of Onset  ? Hypertension Mother   ? Arthritis Mother   ? High blood pressure Mother   ? Hypertension Father   ? Birth defects Father   ? High Cholesterol Father   ?  Learning disabilities Father   ? Alzheimer's disease Son   ? ?Allergies  ?Allergen Reactions  ? Bupivacaine-Meloxicam Er   ? Erythromycin   ?  Blind  ? Mobic [Meloxicam]   ?  Stomach Ulcers  ? Tetracyclines & Related   ?  Blind  ? ? ?Current Outpatient Medications:  ?  amLODipine (NORVASC) 5 MG tablet, Take 1 tablet (5 mg total) by mouth daily., Disp: 90 tablet, Rfl: 2 ?  lisinopril (ZESTRIL) 30 MG tablet, Take 1 tablet (30 mg total) by mouth daily., Disp: 90 tablet, Rfl: 2 ? ?OBJECTIVE: ?BP 118/74 (BP Location: Right Arm, Patient Position: Sitting, Cuff Size: Normal)   Pulse (!) 101   Temp 97.9 ?F (36.6 ?C) (Oral)   Resp 16   Ht 5\' 3"  (1.6 m)   Wt 208 lb (94.3 kg)   SpO2 98%   BMI 36.85 kg/m?  ?General:  well developed, well nourished, in no apparent distress ?Skin:  no significant moles, warts, or growths ?Nose:  nares patent, septum midline, mucosa normal ?Throat/Pharynx:  lips and gingiva without lesion; tongue and uvula midline; non-inflamed pharynx; no exudates or postnasal drainage ?Lungs:  clear to auscultation, breath sounds equal bilaterally, no respiratory distress ?Cardio:  regular rate and rhythm, no LE edema or bruits ?Psych: well oriented with normal range of affect and appropriate judgment/insight ? ?  ASSESSMENT/PLAN: ?Essential hypertension ? ?Iron deficiency anemia, unspecified iron deficiency anemia type ? ?Melena - Plan: CBC, IBC + Ferritin ? ?Lumbar disc herniation with radiculopathy - Plan: Ambulatory referral to Physical Medicine Rehab ? ?Chronic, stable.  Continue lisinopril 30 mg daily, amlodipine 5 mg daily.  Counseled on diet and exercise. ?Recurrent, check labs today.  Over-the-counter iron and iron rich foods recommended.  Depending on the levels, may have to get her set up with the hematology team for iron infusions. ?Check labs.  This is starting to resolve but if it returns in the absence of NSAID usage, we will refer her to the gastroenterology team. ?Chronic, stable.  Refer to  [facility] for possible back injections that have been helpful for her. ?Patient should return in around 6 months pending the above ?The patient voiced understanding and agreement to the plan. ? ? ?[name] [place], DO ?10/04/21  ?2:19 PM ? ?

## 2021-10-05 ENCOUNTER — Other Ambulatory Visit (HOSPITAL_BASED_OUTPATIENT_CLINIC_OR_DEPARTMENT_OTHER): Payer: Self-pay

## 2021-10-05 LAB — CBC
HCT: 41.6 % (ref 36.0–46.0)
Hemoglobin: 13.9 g/dL (ref 12.0–15.0)
MCHC: 33.4 g/dL (ref 30.0–36.0)
MCV: 93.5 fl (ref 78.0–100.0)
Platelets: 334 10*3/uL (ref 150.0–400.0)
RBC: 4.45 Mil/uL (ref 3.87–5.11)
RDW: 14.2 % (ref 11.5–15.5)
WBC: 10.6 10*3/uL — ABNORMAL HIGH (ref 4.0–10.5)

## 2021-10-05 LAB — IBC + FERRITIN
Ferritin: 12.6 ng/mL (ref 10.0–291.0)
Iron: 90 ug/dL (ref 42–145)
Saturation Ratios: 18.8 % — ABNORMAL LOW (ref 20.0–50.0)
TIBC: 478.8 ug/dL — ABNORMAL HIGH (ref 250.0–450.0)
Transferrin: 342 mg/dL (ref 212.0–360.0)

## 2021-10-06 ENCOUNTER — Other Ambulatory Visit (HOSPITAL_BASED_OUTPATIENT_CLINIC_OR_DEPARTMENT_OTHER): Payer: Self-pay

## 2021-10-10 IMAGING — CT CT HEAD W/O CM
4 series · 16 of 47 positions shown, 18 images · non-contrast
Comparison: None.

CLINICAL DATA: 34-year-old female with altered mental status,
apparent bilateral weakness after alcohol and marijuana use.

EXAM:
CT HEAD WITHOUT CONTRAST
TECHNIQUE: Contiguous axial images were obtained from the base of the skull
through the vertex without intravenous contrast.

[Series 3: head without · axial · non-contrast · 0.47mm/px · z∈[-80,+30]mm · 6 of 32 slices shown, 8 images]
[im 5/32  brain]
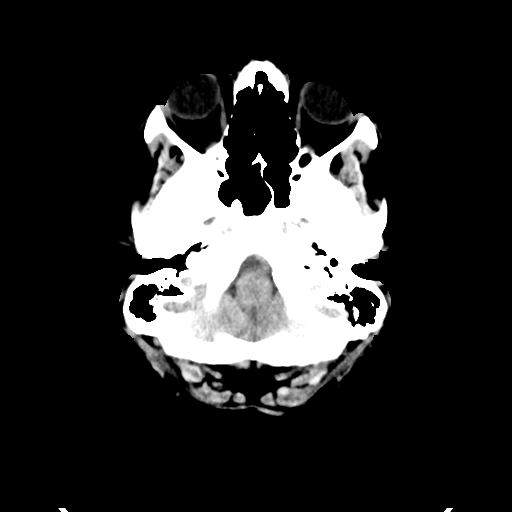
[im 5/32  bone]
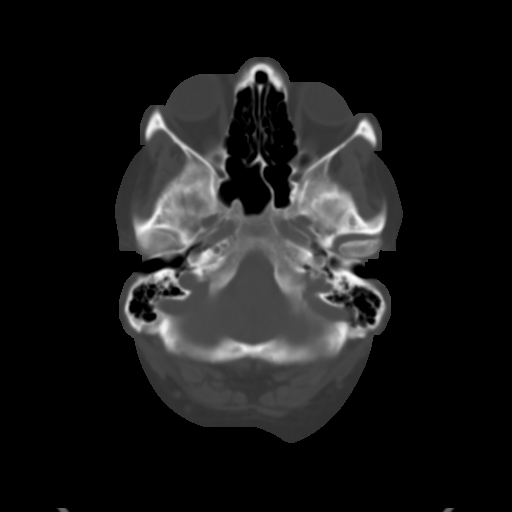
[im 9/32  brain]
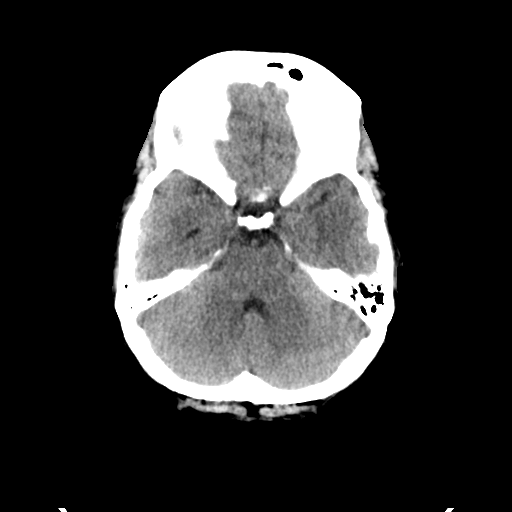
[im 14/32  brain]
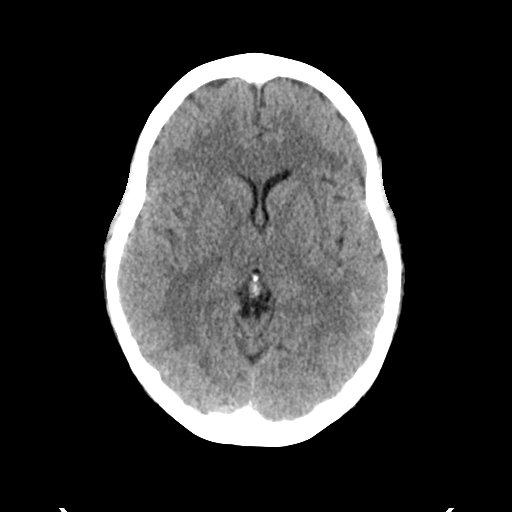
[im 18/32  brain]
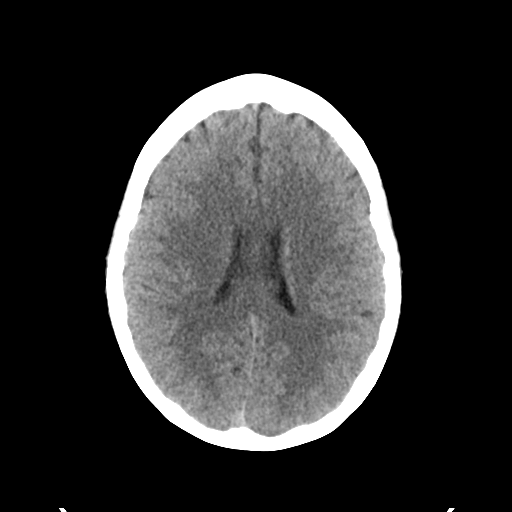
[im 23/32  brain]
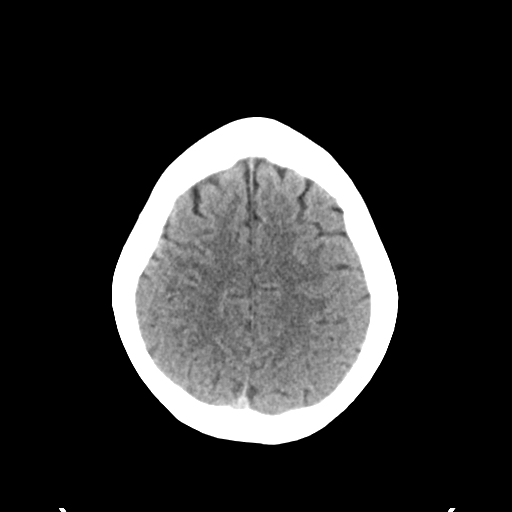
[im 23/32  bone]
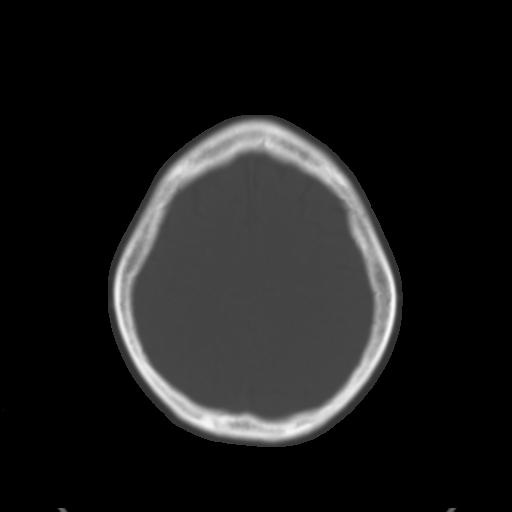
[im 27/32  brain]
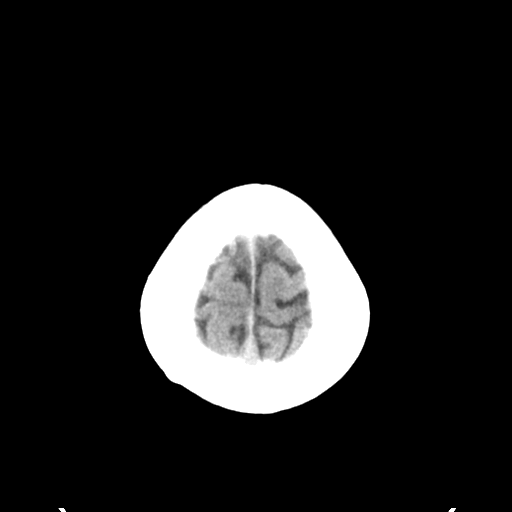

[Series 4: head bone · axial · 0.47mm/px · z∈[-86,-30]mm · 4 of 83 slices shown]
[im 8/83  bone]
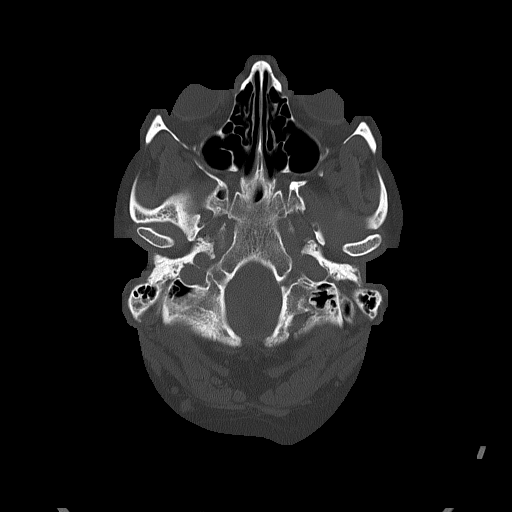
[im 16/83  bone]
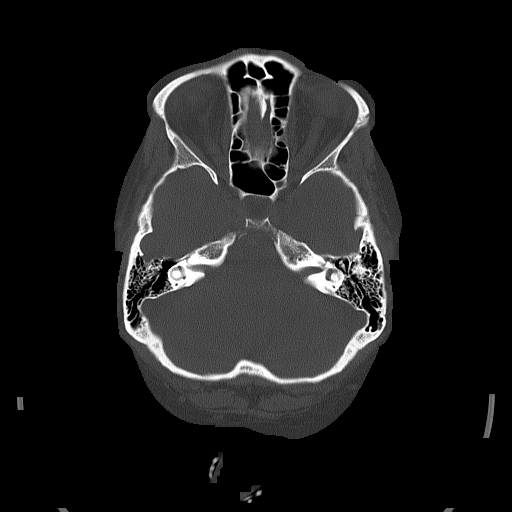
[im 28/83  bone]
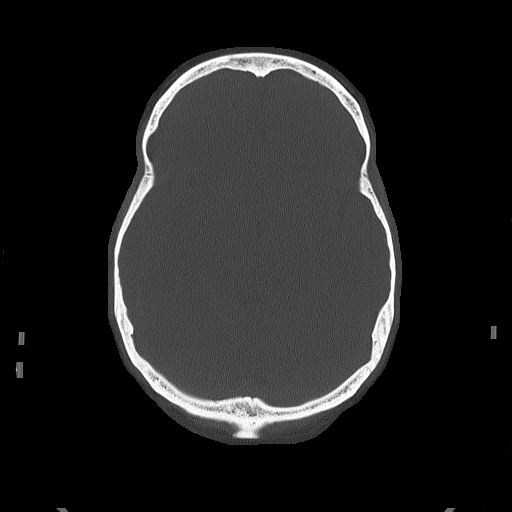
[im 36/83  bone]
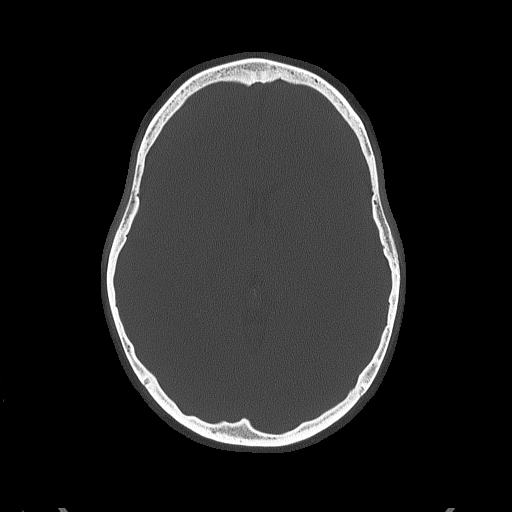

[Series 5: head without cor · coronal · non-contrast · 0.32mm/px · 3 of 73 slices shown]
[im 25/73  brain]
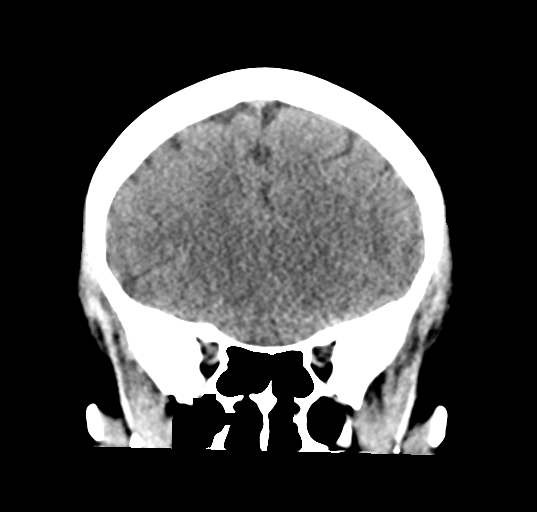
[im 33/73  brain]
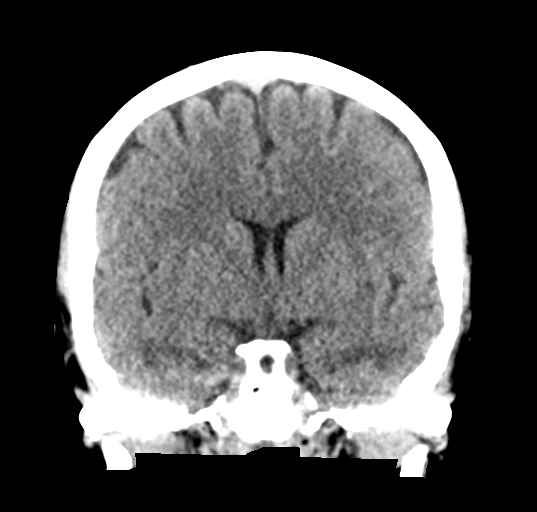
[im 41/73  brain]
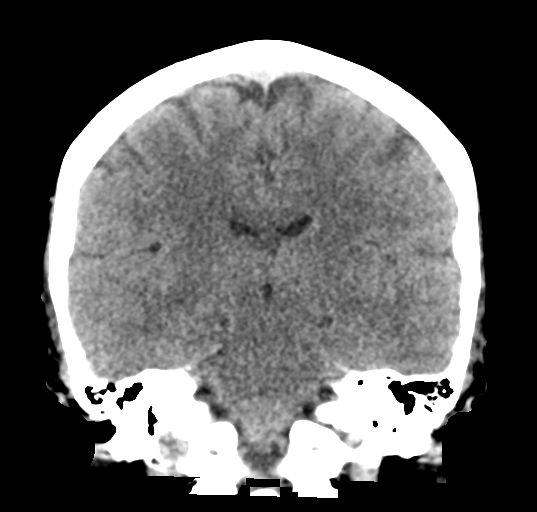

[Series 6: head without sag · sagittal · non-contrast · 0.32mm/px · 3 of 60 slices shown]
[im 20/60  brain]
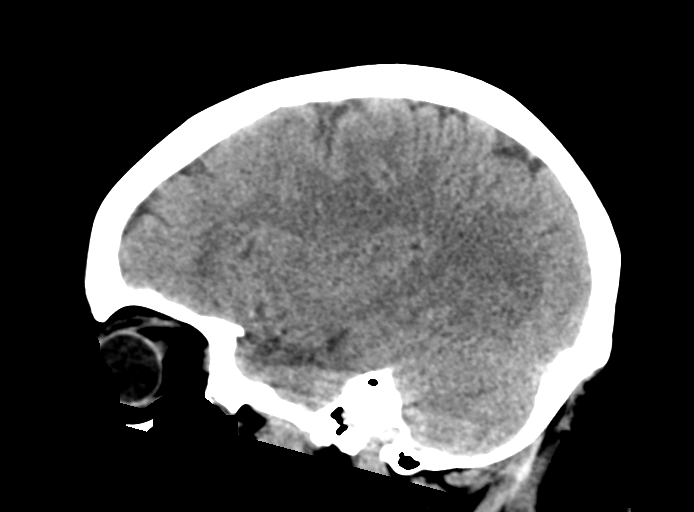
[im 30/60  brain]
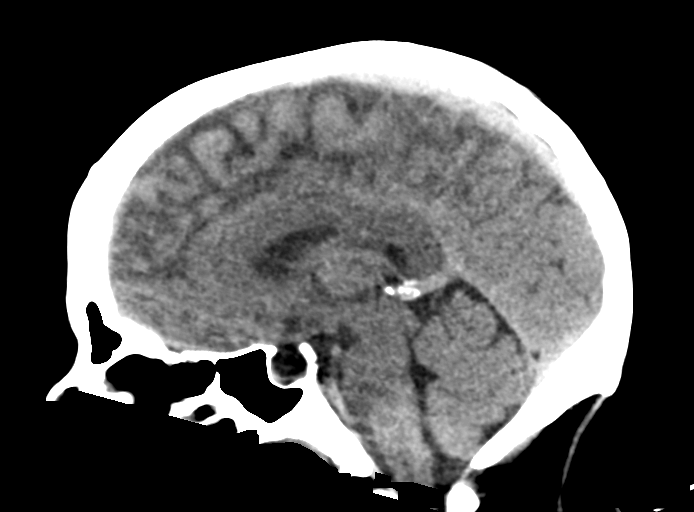
[im 40/60  brain]
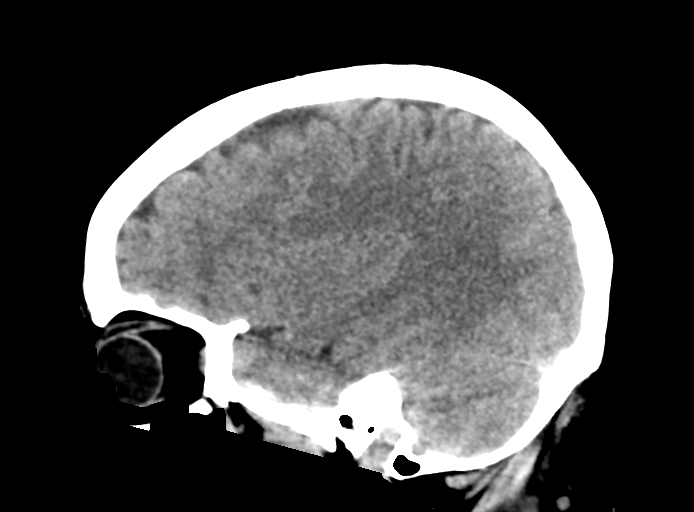

[16 of 47 positions shown; findings below may reference images not displayed]

FINDINGS: Brain: Partially empty sella. Normal cerebral volume. No midline
shift, ventriculomegaly, mass effect, evidence of mass lesion,
intracranial hemorrhage or evidence of cortically based acute
infarction. Gray-white matter differentiation is within normal
limits throughout the brain.

Vascular: No hyperdense vessel or unexpected calcification.

Skull: Negative.

Sinuses/Orbits: Visualized paranasal sinuses and mastoids are clear.

Other: Visualized orbits and scalp soft tissues are within normal
limits.
IMPRESSION: Negative noncontrast CT appearance of the brain.

## 2021-10-17 ENCOUNTER — Encounter: Payer: Self-pay | Admitting: Physical Medicine & Rehabilitation

## 2021-12-07 ENCOUNTER — Other Ambulatory Visit (HOSPITAL_BASED_OUTPATIENT_CLINIC_OR_DEPARTMENT_OTHER): Payer: Self-pay

## 2021-12-13 ENCOUNTER — Encounter: Payer: Self-pay | Admitting: Physical Medicine & Rehabilitation

## 2021-12-13 ENCOUNTER — Encounter: Payer: 59 | Attending: Physical Medicine & Rehabilitation | Admitting: Physical Medicine & Rehabilitation

## 2021-12-13 VITALS — BP 126/91 | HR 75 | Ht 63.0 in | Wt 211.4 lb

## 2021-12-13 DIAGNOSIS — M961 Postlaminectomy syndrome, not elsewhere classified: Secondary | ICD-10-CM | POA: Insufficient documentation

## 2021-12-13 NOTE — Progress Notes (Signed)
Subjective:    Patient ID: Helen Wells, female    DOB: 07-12-1986, 36 y.o.   MRN: [id number] Maribel Elam is a pleasant 36 y.o. female who is status post left L5-S1 laminotomy, medial fasciotomy, and foraminotomy, discectomy on [id number] by Dr. Ancil Linsey. She did very well until squatting 04/24/2021 after riding swings with her husband.  HPI  CC:  Left Lower ext sciatic  36 year old female with a long history of low back pain as well as left lower extremity sciatic pain.  She was treated at [facility] spine but now is transferring to [facility] for her medical care due to change in insurance.  Her back issues have gone on for greater than 2 years.  She had surgery approximately 18 months ago as listed above. 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  She needs to squat and pick up kids on a frequent basis. Tried PT in past which caused some flareup of symptoms.  She has done swimming for exercise in the past but has not done so since last fall with her most recent flareup as well as reherniation.   Has had some relief with medrol dosepack  The patient was considering spinal cord stimulation trial but since her pain has subsided after 3 epidural steroid injections performed earlier this year, she is now holding off on this.  The patient had an epidural steroid injection left L5-S1 transforaminal in November 2022.  She could not get back in on a timely basis and therefore went to [place] and received 2 more epidural injections.  She does not recall the exact level but thinks they were bilateral.  On the second set of injections she did have IV sedation due to discomfort related to procedure. The patient is independent with all self-care and mobility   Pain Inventory Average Pain  3 Pain Right Now 2 My pain is sharp, burning, stabbing, and tingling  In the last 24 hours, has pain interfered with the following? General activity 2 Relation with others 2 Enjoyment of life 2 What TIME of day is your pain at its worst? morning , daytime, and evening Sleep (in general) Fair  Pain is worse with: bending, sitting, and some activites Pain improves with: rest, medication, and injections Relief from Meds: 2  walk without assistance ability to climb steps?  yes do you drive?  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  no  Any changes since last visit?  no    Family History  Problem Relation Age of Onset   Hypertension Mother    Arthritis Mother    High blood pressure Mother    Hypertension Father    Birth defects Father    High Cholesterol Father    Learning disabilities Father    Alzheimer's disease Son    Social History   Socioeconomic History   Marital status: Married    Spouse name: Not on file   Number of children: Not on file   Years of education: Not on file   Highest education level: Not on file  Occupational History   Not on file  Tobacco Use   Smoking status: Never   Smokeless tobacco: Never  Substance and Sexual Activity  Alcohol use: Not Currently    Alcohol/week: 1.0 standard drink    Types: 1 Shots of liquor per week   Drug use: Never   Sexual activity: Not on file  Other Topics Concern   Not on file  Social History Narrative   ** Merged History Encounter **       Social Determinants of Health   Financial Resource Strain: Not on file  Food Insecurity: Not on file  Transportation Needs: Not on file  Physical Activity: Not on file  Stress: Not on file  Social Connections: Not on file   Past Surgical History:  Procedure Laterality Date   Gallbladder Removal 2017     lancinectomy and Discectomy 2021     Dr Orlie Pollen - Neurosurgeon    Past Medical History:  Diagnosis Date   Anemia    Anemia    Anxiety    Clotting disorder (HCC)    Hyperlipemia    Hypertension    Hypertension    BP (!) 126/91   Pulse 75   Ht 5\' 3"  (1.6 m)   Wt 211 lb 6.4 oz (95.9 kg)   SpO2 99%   BMI 37.45 kg/m   Opioid Risk Score:   Fall Risk Score:  `1  Depression screen [facility] 2/9     12/13/2021   10:58 AM 10/04/2021    1:08 PM  Depression screen PHQ 2/9  Decreased Interest 0 2  Down, Depressed, Hopeless 0 0  PHQ - 2 Score 0 2  Altered sleeping 2 3  Tired, decreased energy 2 3  Change in appetite 0 0  Feeling bad or failure about yourself  0 0  Trouble concentrating 0 1  Moving slowly or fidgety/restless 0 0  Suicidal thoughts 0 0  PHQ-9 Score 4 9     Review of Systems  Constitutional: Negative.   HENT: Negative.    Eyes: Negative.   Respiratory: Negative.    Cardiovascular: Negative.   Gastrointestinal: Negative.   Endocrine: Negative.   Genitourinary: Negative.   Musculoskeletal:  Positive for back pain.  Skin: Negative.   Allergic/Immunologic: Negative.   Neurological:        Tingling  Hematological: Negative.   Psychiatric/Behavioral:  The patient is nervous/anxious.   All other systems reviewed and are negative.     Objective:   Physical Exam  Cervical spine has normal range of motion no pain with range of motion Upper extremity range of motion is normal at the shoulders elbows wrist and hands. Lower extremity range of motion normal at the hips knees and ankles with the exception of the left hip during Faber's she experiences sciatic pain down the left lower extremity. Motor strength is 5/5 bilateral deltoid bicep tricep grip hip flexor knee extensor ankle dorsiflexor plantar flexor Negative straight leg raising bilaterally MSK Tenderness palpation bilateral lumbar paraspinal areas around L5-S1. There is no tenderness palpation in the upper lumbar or lower thoracic area. There is some tenderness at the  PSIS area on the left side and she states that pain does radiate downward from there into her leg when this area is pushed. Negative thigh thrust test bilaterally Negative distraction test bilaterally Faber's on the left reproduces some of her sciatic pain but no groin pain, some increased and lower extremity pain.  Good tendon reflexes normal bilateral patellar and right Achilles, absent left Achilles Sensation reduced to pinprick and light touch in the left S1 dermatomal distribution.     Assessment & Plan:  1.  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  She states that while she has required IV sedation on 1 occasion she generally has not.  We discussed that this clinic does not offer IV sedation for epidural procedures. We discussed that if she had a flareup we would probably start with a Medrol Dosepak and then schedule for epidural injection.  Most likely will try S1 transforaminal versus L5-S1 transforaminal. We will request records from the clinic in [place] where she had epidural steroid injections x2. Discussed the importance of physical therapy we will make referral to aquatic therapy given her history of exercising in the water specifically swimming. Discussed with patient agrees with plan

## 2021-12-13 NOTE — Patient Instructions (Signed)

## 2021-12-13 NOTE — Progress Notes (Signed)
Subjective:    Patient ID: Helen Wells, female    DOB: 04/14/1986, 36 y.o.   MRN: [id number]  HPI  Pain Inventory Average Pain 3 Pain Right Now 2 My pain is sharp, burning, stabbing, and tingling  In the last 24 hours, has pain interfered with the following? General activity 2 Relation with others 2 Enjoyment of life 2 What TIME of day is your pain at its worst? morning , daytime, and evening Sleep (in general) Fair  Pain is worse with: bending, sitting, and some activites Pain improves with: rest, medication, and injections Relief from Meds: 2  walk without assistance ability to climb steps?  yes do you drive?  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  no  Any changes since last visit?  no    Family History  Problem Relation Age of Onset   Hypertension Mother    Arthritis Mother    High blood pressure Mother    Hypertension Father    Birth defects Father    High Cholesterol Father    Learning disabilities Father    Alzheimer's disease Son    Social History   Socioeconomic History   Marital status: Married    Spouse name: Not on file   Number of children: Not on file   Years of education: Not on file   Highest education level: Not on file  Occupational History   Not on file  Tobacco Use   Smoking status: Never   Smokeless tobacco: Never  Substance and Sexual Activity   Alcohol use: Not Currently    Alcohol/week: 1.0 standard drink    Types: 1 Shots of liquor per week   Drug use: Never   Sexual activity: Not on file  Other Topics Concern   Not on file  Social History Narrative   ** Merged History Encounter **       Social Determinants of Health   Financial Resource Strain: Not on file  Food Insecurity: Not on file  Transportation Needs: Not on file  Physical Activity: Not on file  Stress: Not on file  Social Connections: Not on file   Past  Surgical History:  Procedure Laterality Date   Gallbladder Removal 2017     lancinectomy and Discectomy 2021     Dr Orlie PollenHnilica - Neurosurgeon   Past Medical History:  Diagnosis Date   Anemia    Anemia    Anxiety    Clotting disorder (HCC)    Hyperlipemia    Hypertension    Hypertension    BP (!) 126/91   Pulse 75   Ht 5\' 3"  (1.6 m)   Wt 211 lb 6.4 oz (95.9 kg)   SpO2 99%   BMI 37.45 kg/m   Opioid Risk Score:   Fall Risk Score:  `1  [facility]     12/13/2021   10:58 AM 10/04/2021    1:08 PM  Depression screen PHQ 2/9  Decreased Interest 0 2  Down, Depressed, Hopeless 0 0  PHQ - 2 Score 0 2  Altered sleeping 2 3  Tired, decreased energy 2 3  Change in appetite 0 0  Feeling bad or failure about yourself  0 0  Trouble concentrating 0 1  Moving slowly or fidgety/restless 0 0  Suicidal thoughts 0 0  PHQ-9 Score 4 9     Review of Systems  Constitutional: Negative.  HENT: Negative.    Eyes: Negative.   Respiratory: Negative.    Cardiovascular: Negative.   Gastrointestinal: Negative.   Endocrine: Negative.   Genitourinary: Negative.   Musculoskeletal:  Positive for back pain.  Skin: Negative.   Allergic/Immunologic: Negative.   Neurological:        Tingling  Hematological: Negative.   Psychiatric/Behavioral:  The patient is nervous/anxious.   All other systems reviewed and are negative.     Objective:   Physical Exam        Assessment & Plan:

## 2022-01-18 ENCOUNTER — Encounter: Payer: Self-pay | Admitting: General Practice

## 2022-01-31 ENCOUNTER — Encounter: Payer: 59 | Attending: Physical Medicine & Rehabilitation | Admitting: Physical Medicine & Rehabilitation

## 2022-01-31 ENCOUNTER — Other Ambulatory Visit: Payer: Self-pay

## 2022-01-31 ENCOUNTER — Ambulatory Visit: Payer: 59 | Attending: Physical Medicine & Rehabilitation | Admitting: Physical Therapy

## 2022-01-31 DIAGNOSIS — M549 Dorsalgia, unspecified: Secondary | ICD-10-CM | POA: Diagnosis not present

## 2022-01-31 DIAGNOSIS — M5417 Radiculopathy, lumbosacral region: Secondary | ICD-10-CM | POA: Diagnosis not present

## 2022-01-31 DIAGNOSIS — M961 Postlaminectomy syndrome, not elsewhere classified: Secondary | ICD-10-CM | POA: Diagnosis not present

## 2022-01-31 DIAGNOSIS — M6281 Muscle weakness (generalized): Secondary | ICD-10-CM | POA: Insufficient documentation

## 2022-01-31 DIAGNOSIS — M5459 Other low back pain: Secondary | ICD-10-CM

## 2022-01-31 NOTE — Therapy (Signed)
OUTPATIENT PHYSICAL THERAPY THORACOLUMBAR EVALUATION   Patient Name: Helen Wells MRN: [id number] DOB:07-27-1985, 36 y.o., female Today's Date: 01/31/2022   PT End of Session - 01/31/22 2103     Visit Number 1    Date for PT Re-Evaluation 04/25/22    Authorization Type [facility]    PT Start Time 1618    PT Stop Time 1658    PT Time Calculation (min) 40 min    Activity Tolerance Patient tolerated treatment well             Past Medical History:  Diagnosis Date   Anemia    Anemia    Anxiety    Clotting disorder (HCC)    Hyperlipemia    Hypertension    Hypertension    Past Surgical History:  Procedure Laterality Date   Gallbladder Removal 2017     lancinectomy and Discectomy 2021     Dr Orlie Pollen - Neurosurgeon   Patient Active Problem List   Diagnosis Date Noted   Essential hypertension 10/04/2021   Iron deficiency anemia 10/04/2021    PCP: Sharlene Dory DO  REFERRING PROVIDER: Dr. Claudette Laws  REFERRING DIAG: M96.1 lumbar post-laminectomy syndrome  Rationale for Evaluation and Treatment Rehabilitation  THERAPY DIAG:  Back pain; weakness ONSET DATE: Jan 2023  SUBJECTIVE:                                                                                                                                                                                           SUBJECTIVE STATEMENT: 3 years of chronic pain L5-S1 surgery lami Nov 2021.; 1 year later tore scar tissue squatting to get to the dryer; Jan '23 HNP same level trying to avoid another surgery; 4 sets of ESI, the last one I had helped a lot; lost 35#;  If had another surgery they would do fusion; recommendation for spinal cord stimulator; previously unable to do PT;  numb left lower lateral lower leg to foot since surgery After back surgery swam but then had to stop  "I'm scheduled for another ESI end of next week b/c I'm scared this will flare me up" PERTINENT HISTORY:  3 years of  chronic pain L5-S1 surgery lami Nov 2021 Had covid 3x  PAIN:  Are you having pain? Yes NPRS scale: 0/10 Pain location: buttock left to feet  Aggravating factors: lifting (doesn't do more than 10#); pull; raking Relieving factors: lying prone; ice    PRECAUTIONS: None  WEIGHT BEARING RESTRICTIONS No  FALLS:  Has patient fallen in last 6 months? 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  PLOF: Independent with basic ADLs  PATIENT GOALS build muscle; avoid surgery; go back to hiking (outdoor stuff); canoe   OBJECTIVE:   DIAGNOSTIC FINDINGS:  MRI HNP L5-S1  PATIENT SURVEYS:  FOTO 48%  SCREENING FOR RED FLAGS: Bowel or bladder incontinence: No  SENSATION: Light touch: Impaired left lower leg to foot   POSTURE: No Significant postural limitations   LUMBAR ROM:  Extensive movement testing not performed secondary to fearfulness of physical activity Flexion to 50 degrees, extension 10 degrees, sidebending left and right 30 degrees Able to lie prone (pain relieving) TRUNK STRENGTH: Grossly 4/5 with decreased lower abdominal, lumbar multifidi, left glute med strength  LOWER EXTREMITY ROM:   WFLs   LOWER EXTREMITY MMT:  No motor loss in left LE  FUNCTIONAL TESTS:  Able to single leg stand on left with min compensations  GAIT:  Comments: WFLs    TODAY'S TREATMENT  Treatment plan   PATIENT EDUCATION:  Education details: treatment plan Person educated: Patient Education method: Explanation Education comprehension: verbalized understanding   HOME EXERCISE PROGRAM: Aquatic program to be initiated  ASSESSMENT:  CLINICAL IMPRESSION: Patient is a 36 y.o. female who was seen today for physical therapy evaluation and treatment for lumbar HNP of L5-S1.  She had  previous lami surgery in Nov 2021 with a re-rupture of the same disc in Jan 2023.  She has had multiple ESI with the last one providing excellent pain relief.  She is very fearful of physical activity but feels she needs to build strength in order to avoid having another surgery and possibly return to outdoor recreational activities.  High symptom irritability therefore aquatic PT recommended.     OBJECTIVE IMPAIRMENTS decreased activity tolerance, decreased strength, impaired perceived functional ability, and pain.   ACTIVITY LIMITATIONS carrying, lifting, bending, and squatting  PARTICIPATION LIMITATIONS: community activity, occupation, and yard work  PERSONAL FACTORS Past/current experiences, Time since onset of injury/illness/exacerbation, and 1 comorbidity: previous back surgery  are also affecting patient's functional outcome  Patient lives and works in [place] so we discussed 1x/week for 6-8 sessions   REHAB POTENTIAL: Good  CLINICAL DECISION MAKING: Stable/uncomplicated  EVALUATION COMPLEXITY: Low   GOALS: Goals reviewed with patient? Yes  SHORT TERM GOALS=LTGs    LONG TERM GOALS: Target date: 04/25/2022  The patient will be independent in a safe self progression of aquatic exercise program to promote further recovery of function   Baseline:  Goal status: INITIAL  2.  The patient will report  pain levels 4/10 with home and work functional activities Goal status:  INITIAL  3.  Trunk strength grossly 4+/5 needed for better tolerance of home and work ADLs Baseline:  Goal status: INITIAL  4.  The patient will have improved FOTO score to    64%   indicating improved function with less pain  Baseline:  Goal status: INITIAL    PLAN: PT FREQUENCY: 1x/week  PT DURATION: 12 weeks  6-8 sessions for independence in aquatic ex program (pt commuting from [place])  PLANNED INTERVENTIONS: Therapeutic exercises, Therapeutic activity, Neuromuscular re-education, Balance  training, Gait training, Patient/Family education, Self Care, Joint mobilization, Aquatic Therapy, Dry Needling, Electrical stimulation, Spinal mobilization, Cryotherapy, Moist heat, Ultrasound, Ionotophoresis 4mg /ml Dexamethasone, and Manual therapy.  PLAN FOR NEXT SESSION: low intensity aquatic ex secondary to fearfulness of exacerbation of HNP symptoms (extension biased ex's)  , PT 01/31/22  9:30 PM Phone: [phone] Fax: [phone]

## 2022-02-06 ENCOUNTER — Telehealth: Payer: Self-pay

## 2022-02-06 ENCOUNTER — Telehealth: Payer: Self-pay | Admitting: Physical Medicine & Rehabilitation

## 2022-02-06 NOTE — Telephone Encounter (Signed)
Patient called to see if she can get a steroid dose pack. She stated sometimes if she takes the dose pack, she doesn't need the injection. Her injection is scheduled for 03/23/22. Please advise

## 2022-02-06 NOTE — Telephone Encounter (Signed)
Patient missed her follow up 7/18 states she would like back injection please advise what injection we may schedule her to have.

## 2022-02-08 ENCOUNTER — Other Ambulatory Visit (HOSPITAL_BASED_OUTPATIENT_CLINIC_OR_DEPARTMENT_OTHER): Payer: Self-pay

## 2022-02-08 ENCOUNTER — Ambulatory Visit: Payer: 59 | Admitting: Medical

## 2022-02-08 ENCOUNTER — Encounter: Payer: Self-pay | Admitting: Medical

## 2022-02-08 VITALS — BP 126/89 | HR 74 | Resp 20 | Ht 63.0 in | Wt 207.0 lb

## 2022-02-08 DIAGNOSIS — R21 Rash and other nonspecific skin eruption: Secondary | ICD-10-CM | POA: Diagnosis not present

## 2022-02-08 MED ORDER — PERMETHRIN 5 % EX CREA
TOPICAL_CREAM | CUTANEOUS | 0 refills | Status: DC
  Filled 2022-02-08: qty 60, 7d supply, fill #0

## 2022-02-08 NOTE — Patient Instructions (Signed)
Recent small papular eruptions in right thigh, elbow and left flank.  Extent of distribution does not match classic scabies but do understand your concerns.  Go ahead and prescribe Elimite cream.  We will will see how you do and ask for update in 7 days.  Update sooner if signs/symptoms change.  If not much improvement by 7 days consider short course of Medrol taper and topical triamcinolone to cover allergic reaction.  Follow-up 7 days or sooner if needed.

## 2022-02-08 NOTE — Progress Notes (Signed)
Subjective:    Patient ID: Helen Wells, female    DOB: Jun 29, 1986, 36 y.o.   MRN: [id number]  HPI  Pt has 5 bumps on her rt thigh, one on her rt elbow and one  left flank.   Pt has concern for scabies. 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  One of children she works with is homeless with small bumps.   Area do itch.     Review of Systems  Constitutional:  Negative for chills, fatigue and fever.  Respiratory:  Negative for cough, chest tightness, shortness of breath and wheezing.   Cardiovascular:  Negative for chest pain and palpitations.  Skin:  Positive for rash.  Hematological:  Negative for adenopathy. Does not bruise/bleed easily.    Past Medical History:  Diagnosis Date   Anemia    Anemia    Anxiety    Clotting disorder (HCC)    Hyperlipemia    Hypertension    Hypertension      Social History   Socioeconomic History   Marital status: Married    Spouse name: Not on file   Number of children: Not on file   Years of education: Not on file   Highest education level: Not on file  Occupational History   Not on file  Tobacco Use   Smoking status: Never   Smokeless tobacco: Never  Substance and Sexual Activity   Alcohol use: Not Currently    Alcohol/week: 1.0 standard drink of alcohol    Types: 1 Shots of liquor per week   Drug use: Never   Sexual activity: Not on file  Other Topics Concern   Not on file  Social History Narrative   ** Merged History Encounter **       Social Determinants of Health   Financial Resource Strain: Not on file  Food Insecurity: Not on file  Transportation Needs: Not on file  Physical Activity: Not on file  Stress: Not on file  Social Connections: Not on file  Intimate Partner Violence: Not on file    Past Surgical History:  Procedure Laterality Date   Gallbladder Removal 2017     lancinectomy and Discectomy 2021     Dr Orlie Pollen - Neurosurgeon    Family History  Problem Relation Age of Onset    Hypertension Mother    Arthritis Mother    High blood pressure Mother    Hypertension Father    Birth defects Father    High Cholesterol Father    Learning disabilities Father    Alzheimer's disease Son     Allergies  Allergen Reactions   Bupivacaine-Meloxicam Er     Other reaction(s): Unknown   Erythromycin     Blind Other reaction(s): Other 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 Other reaction(s): Other Informed by her MD not to take any Tetracyclines. Informed by her MD not to take any Tetracyclines. Blind    Erythromycin Base     Other reaction(s): Other (See Comments) Other    Macrolides And Ketolides     Other reaction(s): Other (See Comments), Other (See Comments) Blindness, Pseudotumor Cerebri  Blindness, Pseudotumor Cerebri      Current Outpatient Medications on File Prior to  Visit  Medication Sig Dispense Refill   amLODipine (NORVASC) 5 MG tablet Take 1 tablet (5 mg total) by mouth daily. 90 tablet 2   lisinopril (ZESTRIL) 30 MG tablet Take 1 tablet (30 mg total) by mouth daily. 90 tablet 2   No current facility-administered medications on file prior to visit.    BP 126/89 (BP Location: Left Arm, Patient Position: Sitting, Cuff Size: Normal)   Pulse 74   Resp 20   Ht 5\' 3"  (1.6 m)   Wt 207 lb (93.9 kg)   SpO2 100%   BMI 36.67 kg/m        Objective:   Physical Exam  General- No acute distress. Pleasant patient. Neck- Full range of motion, no jvd Lungs- Clear, even and unlabored. Heart- regular rate and rhythm. Neurologic- CNII- XII grossly intact.  Skin-right thigh group of small scattered papules numbering 5.  Right elbow 1 papule and left flank 1 papule.  No vesicles seen.       Assessment & Plan:    Patient Instructions  Recent small papular eruptions in right thigh, elbow and left flank.  Extent of distribution does not match classic scabies but do understand your concerns.  Go ahead and prescribe Elimite cream.  We will will see how you do and ask for update in 7 days.  Update sooner if signs/symptoms change.  If not much improvement by 7 days consider short course of Medrol taper and topical triamcinolone to cover allergic reaction.  Follow-up 7 days or sooner if needed.   , PA-C

## 2022-02-09 ENCOUNTER — Other Ambulatory Visit (HOSPITAL_BASED_OUTPATIENT_CLINIC_OR_DEPARTMENT_OTHER): Payer: Self-pay

## 2022-02-09 MED ORDER — METHYLPREDNISOLONE 4 MG PO TBPK
ORAL_TABLET | ORAL | 0 refills | Status: DC
  Filled 2022-02-09: qty 21, 6d supply, fill #0

## 2022-02-09 NOTE — Addendum Note (Signed)
Addended by: Sharlet Salina on: 02/09/2022 01:31 PM   Modules accepted: Orders

## 2022-02-09 NOTE — Telephone Encounter (Signed)
Medrol dose pack sent to pharmacy. Patient notified

## 2022-03-07 ENCOUNTER — Other Ambulatory Visit (HOSPITAL_BASED_OUTPATIENT_CLINIC_OR_DEPARTMENT_OTHER): Payer: Self-pay

## 2022-03-08 ENCOUNTER — Other Ambulatory Visit (HOSPITAL_BASED_OUTPATIENT_CLINIC_OR_DEPARTMENT_OTHER): Payer: Self-pay

## 2022-03-10 ENCOUNTER — Encounter (HOSPITAL_BASED_OUTPATIENT_CLINIC_OR_DEPARTMENT_OTHER): Payer: Self-pay | Admitting: Physical Therapy

## 2022-03-10 ENCOUNTER — Ambulatory Visit (HOSPITAL_BASED_OUTPATIENT_CLINIC_OR_DEPARTMENT_OTHER): Payer: 59 | Attending: Physical Medicine & Rehabilitation | Admitting: Physical Therapy

## 2022-03-10 DIAGNOSIS — M6281 Muscle weakness (generalized): Secondary | ICD-10-CM | POA: Insufficient documentation

## 2022-03-10 DIAGNOSIS — M5417 Radiculopathy, lumbosacral region: Secondary | ICD-10-CM | POA: Insufficient documentation

## 2022-03-10 DIAGNOSIS — M5459 Other low back pain: Secondary | ICD-10-CM | POA: Insufficient documentation

## 2022-03-10 NOTE — Therapy (Signed)
OUTPATIENT PHYSICAL THERAPY THORACOLUMBAR TREATMENT NOTE   Patient Name: Helen Wells MRN: [id number] DOB:04-01-1986, 36 y.o., female Today's Date: 03/10/2022   PT End of Session - 03/10/22 1521     Visit Number 2    Date for PT Re-Evaluation 04/25/22    Authorization Type [facility]    PT Start Time 1520    PT Stop Time 1600    PT Time Calculation (min) 40 min    Activity Tolerance Patient tolerated treatment well    Behavior During Therapy [facility] for tasks assessed/performed             Past Medical History:  Diagnosis Date   Anemia    Anemia    Anxiety    Clotting disorder (HCC)    Hyperlipemia    Hypertension    Hypertension    Past Surgical History:  Procedure Laterality Date   Gallbladder Removal 2017     lancinectomy and Discectomy 2021     Dr Orlie Pollen - Neurosurgeon   Patient Active Problem List   Diagnosis Date Noted   Essential hypertension 10/04/2021   Iron deficiency anemia 10/04/2021    PCP: Sharlene Dory DO  REFERRING PROVIDER: Dr. Claudette Laws  REFERRING DIAG: M96.1 lumbar post-laminectomy syndrome  Rationale for Evaluation and Treatment Rehabilitation  THERAPY DIAG:  Back pain; weakness ONSET DATE: Jan 2023  SUBJECTIVE:                                                                                                                                                                                           SUBJECTIVE STATEMENT: Pt reports she is nervous about session.  "I'm scheduled for another ESI on 9/7" " I'm scared PT will flare me up"  PERTINENT HISTORY:  3 years of chronic pain L5-S1 surgery lami Nov 2021 Had covid 3x  PAIN:  Are you having pain? Yes NPRS scale: 3/10 Pain location: Lt low back  Aggravating factors: lifting (doesn't do more than 10#); pull; raking; prolonged sitting Relieving factors: lying prone; ice    PRECAUTIONS: None  WEIGHT BEARING RESTRICTIONS No  FALLS:  Has patient fallen in  last 6 months? No  LIVING ENVIRONMENT: Lives with: lives with their family Lives in: House/apartment  Has following equipment at home: None  OCCUPATION: own 2 businesses Systems analyst, summer camp; Location manager for mental health  PLOF: Independent with basic ADLs  PATIENT GOALS build muscle; avoid surgery; go back to hiking (outdoor stuff); canoe   OBJECTIVE:   DIAGNOSTIC FINDINGS:  MRI HNP L5-S1  PATIENT SURVEYS:  FOTO 48%  SCREENING FOR RED FLAGS: Bowel or bladder incontinence:  No  SENSATION: Light touch: Impaired left lower leg to foot   POSTURE: No Significant postural limitations   LUMBAR ROM:  Extensive movement testing not performed secondary to fearfulness of physical activity Flexion to 50 degrees, extension 10 degrees, sidebending left and right 30 degrees Able to lie prone (pain relieving) TRUNK STRENGTH: Grossly 4/5 with decreased lower abdominal, lumbar multifidi, left glute med strength  LOWER EXTREMITY ROM:   WFLs   LOWER EXTREMITY MMT:  No motor loss in left LE  FUNCTIONAL TESTS:  Able to single leg stand on left with min compensations  GAIT:  Comments: WFLs    TODAY'S TREATMENT  Pt seen for aquatic therapy today.  Treatment took place in water 3.5-4.75 ft in depth at the [facility] pool. Temp of water was 91.  Pt entered/exited the pool via stairs independently with bilat rail.  * Pt shown spinal pressure with different positions chart prior to entering water * without support:  forward/ backward gait, side stepping with straight knees and increased step height * forward / backward marching with/without noodle * straddling noodle with LE suspended in deeper water:  cycling, hip abdct/add; cc ski * noodle under arms at chest with gentle flutter; legs neutral and suspended for spine decompression * holding wall:  hip abdct/ add; hip ext (gentle range)  * back against wall, knee to chest with blue noodle assist;   noodle stomp (hip flexion to/from neutral and hip abdct with knee flexion to/from neutral)   Pt requires the buoyancy and hydrostatic pressure of water for support, and to offload joints by unweighting joint load by at least 50 % in navel deep water and by at least 75-80% in chest to neck deep water.  Viscosity of the water is needed for resistance of strengthening. Water current perturbations provides challenge to standing balance requiring increased core activation.  PATIENT EDUCATION:  Education details: aquatic exercise intro Person educated: Patient Education method: Explanation Education comprehension: verbalized understanding   HOME EXERCISE PROGRAM: Verbally assigned decompression (suspended from noodle) in deeper water; walking in chest deep water (forward, backward, side stepping); gentle noodle stomp with back against wall.    ASSESSMENT:  CLINICAL IMPRESSION: Holding noodle with gait increased pressure in back; prefers arms at side.  Too much hip abdct increases "pinching feeling" at L low back as well as prone float with noodle with legs are at surface.  May benefit from Lt psoas release and gentle hip flexor stretch.  Reps of exercises kept to less than 10 and within tolerance. Encouraged pt to trial similar exercises at her local pool and report back next visit, as well as ice low back today as preventative measure.   Will progress aquatics gently as tolerated.  Goals are ongoing.      OBJECTIVE IMPAIRMENTS decreased activity tolerance, decreased strength, impaired perceived functional ability, and pain.   ACTIVITY LIMITATIONS carrying, lifting, bending, and squatting  PARTICIPATION LIMITATIONS: community activity, occupation, and yard work  PERSONAL FACTORS Past/current experiences, Time since onset of injury/illness/exacerbation, and 1 comorbidity: previous back surgery  are also affecting patient's functional outcome  Patient lives and works in [place] so we discussed  1x/week for 6-8 sessions   REHAB POTENTIAL: Good  CLINICAL DECISION MAKING: Stable/uncomplicated  EVALUATION COMPLEXITY: Low   GOALS: Goals reviewed with patient? Yes  SHORT TERM GOALS=LTGs    LONG TERM GOALS: Target date: 04/25/2022  The patient will be independent in a safe self progression of aquatic exercise program to promote further recovery  of function   Baseline:  Goal status: INITIAL  2.  The patient will report  pain levels 4/10 with home and work functional activities Goal status:  INITIAL  3.  Trunk strength grossly 4+/5 needed for better tolerance of home and work ADLs Baseline:  Goal status: INITIAL  4.  The patient will have improved FOTO score to    64%   indicating improved function with less pain  Baseline:  Goal status: INITIAL    PLAN: PT FREQUENCY: 1x/week  PT DURATION: 12 weeks  6-8 sessions for independence in aquatic ex program (pt commuting from [place])  PLANNED INTERVENTIONS: Therapeutic exercises, Therapeutic activity, Neuromuscular re-education, Balance training, Gait training, Patient/Family education, Self Care, Joint mobilization, Aquatic Therapy, Dry Needling, Electrical stimulation, Spinal mobilization, Cryotherapy, Moist heat, Ultrasound, Ionotophoresis 4mg /ml Dexamethasone, and Manual therapy.  PLAN FOR NEXT SESSION: assess response to low intensity aquatic ex secondary to fearfulness of exacerbation of HNP symptoms (extension biased ex's)  , PTA 03/10/22 4:21 PM

## 2022-03-17 ENCOUNTER — Ambulatory Visit: Payer: 59 | Attending: Physical Medicine & Rehabilitation | Admitting: Physical Therapy

## 2022-03-17 ENCOUNTER — Encounter: Payer: Self-pay | Admitting: Physical Therapy

## 2022-03-17 DIAGNOSIS — M6281 Muscle weakness (generalized): Secondary | ICD-10-CM | POA: Insufficient documentation

## 2022-03-17 DIAGNOSIS — M5459 Other low back pain: Secondary | ICD-10-CM | POA: Diagnosis present

## 2022-03-17 DIAGNOSIS — M5417 Radiculopathy, lumbosacral region: Secondary | ICD-10-CM | POA: Diagnosis present

## 2022-03-17 NOTE — Therapy (Signed)
OUTPATIENT PHYSICAL THERAPY THORACOLUMBAR TREATMENT NOTE   Patient Name: Helen Wells MRN: [id number] DOB:[birth year], 36 y.o., female Today's Date: 03/17/2022   PT End of Session - 03/17/22 2121     Visit Number 3    Date for PT Re-Evaluation 04/25/22    Authorization Type [facility]    PT Start Time 1345    PT Stop Time 1430    PT Time Calculation (min) 45 min    Activity Tolerance Patient tolerated treatment well    Behavior During Therapy [facility] for tasks assessed/performed              Past Medical History:  Diagnosis Date   Anemia    Anemia    Anxiety    Clotting disorder (HCC)    Hyperlipemia    Hypertension    Hypertension    Past Surgical History:  Procedure Laterality Date   Gallbladder Removal 2017     lancinectomy and Discectomy 2021     Dr Orlie Pollen - Neurosurgeon   Patient Active Problem List   Diagnosis Date Noted   Essential hypertension 10/04/2021   Iron deficiency anemia 10/04/2021    PCP: Sharlene Dory DO  REFERRING PROVIDER: Dr. Claudette Laws  REFERRING DIAG: M96.1 lumbar post-laminectomy syndrome  Rationale for Evaluation and Treatment Rehabilitation  THERAPY DIAG:  Back pain; weakness ONSET DATE: Jan 2023  SUBJECTIVE:                                                                                                                                                                                           SUBJECTIVE STATEMENT: I am doing ok. I was really sore in my back after last session but it was better in a few days.   PERTINENT HISTORY:  3 years of chronic pain L5-S1 surgery lami Nov 2021 Had covid 3x  PAIN:  Are you having pain? Yes NPRS scale: 3/10 Pain location: Lt low back  Aggravating factors: lifting (doesn't do more than 10#); pull; raking; prolonged sitting Relieving factors: lying prone; ice    PRECAUTIONS: None  WEIGHT BEARING RESTRICTIONS No  FALLS:  Has patient fallen in last 6 months?  No  LIVING ENVIRONMENT: Lives with: lives with their family Lives in: House/apartment  Has following equipment at home: None  OCCUPATION: own 2 businesses Systems analyst, summer camp; Location manager for mental health  PLOF: Independent with basic ADLs  PATIENT GOALS build muscle; avoid surgery; go back to hiking (outdoor stuff); canoe   OBJECTIVE:   DIAGNOSTIC FINDINGS:  MRI HNP L5-S1  PATIENT SURVEYS:  FOTO 48%  SCREENING FOR RED FLAGS: Bowel or bladder  incontinence: No  SENSATION: Light touch: Impaired left lower leg to foot   POSTURE: No Significant postural limitations   LUMBAR ROM:  Extensive movement testing not performed secondary to fearfulness of physical activity Flexion to 50 degrees, extension 10 degrees, sidebending left and right 30 degrees Able to lie prone (pain relieving) TRUNK STRENGTH: Grossly 4/5 with decreased lower abdominal, lumbar multifidi, left glute med strength  LOWER EXTREMITY ROM:   WFLs   LOWER EXTREMITY MMT:  No motor loss in left LE  FUNCTIONAL TESTS:  Able to single leg stand on left with min compensations  GAIT:  Comments: WFLs    TODAY'S TREATMENT   03/17/22:Pt seen for aquatic therapy today.  Treatment took place in water 3.5-4.75 ft in depth at the [facility] pool. Temp of water was 91.  Pt entered/exited the pool via stairs independently with bilat rail. Seated water bench with 75% submersion Pt performed seated LE AROM exercises 20x in all planes, concurrent pain assessment. 75% depth water walking 6 lengths each with 30 sec rest standing against the wall. Natural UE movement. Standing single leg stance LT with RTLE movements in 3 directions. VC to contract Lt gluteals more, drops pelvis some. Standing against wall in small squat with half blue noodle lat presses/core compressions 2x5. Horseback bicycle on yellow noodle 1 min 30 sec rest 5x. Seated decompression with yelow nooodle 3 min.   Pt  seen for aquatic therapy today.  Treatment took place in water 3.5-4.75 ft in depth at the [facility] pool. Temp of water was 91.  Pt entered/exited the pool via stairs independently with bilat rail.  * Pt shown spinal pressure with different positions chart prior to entering water * without support:  forward/ backward gait, side stepping with straight knees and increased step height * forward / backward marching with/without noodle * straddling noodle with LE suspended in deeper water:  cycling, hip abdct/add; cc ski * noodle under arms at chest with gentle flutter; legs neutral and suspended for spine decompression * holding wall:  hip abdct/ add; hip ext (gentle range)  * back against wall, knee to chest with blue noodle assist;  noodle stomp (hip flexion to/from neutral and hip abdct with knee flexion to/from neutral)   Pt requires the buoyancy and hydrostatic pressure of water for support, and to offload joints by unweighting joint load by at least 50 % in navel deep water and by at least 75-80% in chest to neck deep water.  Viscosity of the water is needed for resistance of strengthening. Water current perturbations provides challenge to standing balance requiring increased core activation.  PATIENT EDUCATION:  Education details: aquatic exercise intro Person educated: Patient Education method: Explanation Education comprehension: verbalized understanding   HOME EXERCISE PROGRAM: Verbally assigned decompression (suspended from noodle) in deeper water; walking in chest deep water (forward, backward, side stepping); gentle noodle stomp with back against wall.    ASSESSMENT:  CLINICAL IMPRESSION: Pt arrives to aquatic PT with mild low back pain. Pt tolerating aquatic exercises well: some increased back pain temporarily but no radicular symptoms. Pt is afraid to move in certain ways but recognizes it and tries to slow work through it.       OBJECTIVE IMPAIRMENTS decreased  activity tolerance, decreased strength, impaired perceived functional ability, and pain.   ACTIVITY LIMITATIONS carrying, lifting, bending, and squatting  PARTICIPATION LIMITATIONS: community activity, occupation, and yard work  PERSONAL FACTORS Past/current experiences, Time since onset of injury/illness/exacerbation, and 1 comorbidity: previous back  surgery  are also affecting patient's functional outcome  Patient lives and works in [place] so we discussed [place] for 6-8 sessions   REHAB POTENTIAL: Good  CLINICAL DECISION MAKING: Stable/uncomplicated  EVALUATION COMPLEXITY: Low   GOALS: Goals reviewed with patient? Yes  SHORT TERM GOALS=LTGs    LONG TERM GOALS: Target date: 04/25/2022  The patient will be independent in a safe self progression of aquatic exercise program to promote further recovery of function   Baseline:  Goal status: INITIAL  2.  The patient will report  pain levels 4/10 with home and work functional activities Goal status:  INITIAL  3.  Trunk strength grossly 4+/5 needed for better tolerance of home and work ADLs Baseline:  Goal status: INITIAL  4.  The patient will have improved FOTO score to    64%   indicating improved function with less pain  Baseline:  Goal status: INITIAL    PLAN: PT FREQUENCY: 1x/week  PT DURATION: 12 weeks  6-8 sessions for independence in aquatic ex program (pt commuting from [place])  PLANNED INTERVENTIONS: Therapeutic exercises, Therapeutic activity, Neuromuscular re-education, Balance training, Gait training, Patient/Family education, Self Care, Joint mobilization, Aquatic Therapy, Dry Needling, Electrical stimulation, Spinal mobilization, Cryotherapy, Moist heat, Ultrasound, Ionotophoresis 4mg /ml Dexamethasone, and Manual therapy.  PLAN FOR NEXT SESSION: Aquatics   Myrene Galas, PTA 03/17/22 9:33 PM 03/17/22 9:22 PM

## 2022-03-23 ENCOUNTER — Encounter: Payer: 59 | Attending: Physical Medicine & Rehabilitation | Admitting: Physical Medicine & Rehabilitation

## 2022-03-23 ENCOUNTER — Encounter: Payer: Self-pay | Admitting: Physical Medicine & Rehabilitation

## 2022-03-23 VITALS — BP 122/87 | HR 76 | Temp 98.4°F | Ht 63.0 in | Wt 214.6 lb

## 2022-03-23 DIAGNOSIS — M5416 Radiculopathy, lumbar region: Secondary | ICD-10-CM | POA: Insufficient documentation

## 2022-03-23 MED ORDER — DEXAMETHASONE SODIUM PHOSPHATE 10 MG/ML IJ SOLN
10.0000 mg | Freq: Once | INTRAMUSCULAR | Status: AC
  Administered 2022-03-23: 10 mg

## 2022-03-23 MED ORDER — LIDOCAINE HCL (PF) 1 % IJ SOLN
2.0000 mL | Freq: Once | INTRAMUSCULAR | Status: AC
  Administered 2022-03-23: 2 mL

## 2022-03-23 MED ORDER — LIDOCAINE HCL 1 % IJ SOLN
5.0000 mL | Freq: Once | INTRAMUSCULAR | Status: AC
  Administered 2022-03-23: 5 mL

## 2022-03-23 NOTE — Progress Notes (Signed)
Lumbar Left L5-S1 transforaminal epidural steroid injection under fluoroscopic guidance with contrast enhancement  Indication: Lumbosacral radiculitis is not relieved by medication management or other conservative care and interfering with self-care and mobility.  Previous excellent relief with ESI performed in [place].  Pt has husband picking her up  Discussed Bupivicaine "allergy"  was stomach upset with meloxicam /bupivacaine combination   Informed consent was obtained after describing risk and benefits of the procedure with the patient, this includes bleeding, bruising, infection, paralysis and medication side effects.  The patient wishes to proceed and has given written consent.  Patient was placed in prone position.  The lumbar area was marked and prepped with Betadine.  It was entered with a 25-gauge 1-1/2 inch needle and one mL of 1% lidocaine was injected into the skin and subcutaneous tissue.  Then a 22-gauge 5 in spinal needle was inserted into the Left L5-S1  intervertebral foramen under AP, lateral, and oblique view.  Once needle tip was within the foramen on lateral views an dnor exceeding 6 o clock position on the pedicle on AP viewed Omnipaque 180 was inected x 44ml Then a solution containing one mL of 10 mg per mL dexamethasone and 2 mL of 1% lidocaine was injected.  The patient tolerated procedure well.  Post procedure instructions were given.  Please see post procedure form.

## 2022-03-23 NOTE — Progress Notes (Signed)
  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 Other reaction(s): Other Informed by her MD not to take any Tetracyclines. Informed by her MD not to take any Tetracyclines. Blind    Erythromycin Base     Other reaction(s): Other (See Comments) Other    Macrolides And Ketolides     Other reaction(s): Other (See Comments), Other (See Comments) Blindness, Pseudotumor Cerebri  Blindness, Pseudotumor Cerebri      Consent Signed: Yes.    Is patient diabetic? No.  CBG today? .  Pregnant: No. LMP: No LMP recorded. (Menstrual status: Other). (age 52-55)  Anticoagulants: no Anti-inflammatory: no Antibiotics: no  Procedure: Left L5-S1 Transframinal Epidural Steroid Injection  Position: Prone Start Time: 3:35PM End Time: 3:40PM  Fluoro Time: 27  RN/CMA [name]    Time 3:24 3:40PM    BP 122/87 129/93    Pulse 76 78    Respirations 16 16    O2 Sat 98 99    S/S 6 6    Pain Level 5/10 0/10     D/C home with Husband patient A & O X 3, D/C instructions reviewed, and sits independently.

## 2022-03-23 NOTE — Patient Instructions (Signed)

## 2022-03-24 ENCOUNTER — Ambulatory Visit: Payer: 59 | Admitting: Physical Therapy

## 2022-03-24 ENCOUNTER — Encounter: Payer: Self-pay | Admitting: Physical Therapy

## 2022-03-24 DIAGNOSIS — M6281 Muscle weakness (generalized): Secondary | ICD-10-CM

## 2022-03-24 DIAGNOSIS — M5459 Other low back pain: Secondary | ICD-10-CM

## 2022-03-24 DIAGNOSIS — M5417 Radiculopathy, lumbosacral region: Secondary | ICD-10-CM

## 2022-03-24 NOTE — Therapy (Unsigned)
OUTPATIENT PHYSICAL THERAPY THORACOLUMBAR TREATMENT NOTE   Patient Name: Helen Wells MRN: [id number] DOB:[birth year], 36 y.o., female Today's Date: 03/25/2022   PT End of Session - 03/24/22 1553     Visit Number 4    Date for PT Re-Evaluation 04/25/22    Authorization Type [facility]    PT Start Time 1515    PT Stop Time 1553    PT Time Calculation (min) 38 min    Activity Tolerance Patient tolerated treatment well    Behavior During Therapy [facility] for tasks assessed/performed              Past Medical History:  Diagnosis Date   Anemia    Anemia    Anxiety    Clotting disorder (HCC)    Hyperlipemia    Hypertension    Hypertension    Past Surgical History:  Procedure Laterality Date   Gallbladder Removal 2017     lancinectomy and Discectomy 2021     Dr Orlie Pollen - Neurosurgeon   Patient Active Problem List   Diagnosis Date Noted   Lumbar radiculitis 03/23/2022   Essential hypertension 10/04/2021   Iron deficiency anemia 10/04/2021    PCP: Sharlene Dory DO  REFERRING PROVIDER: Dr. Claudette Laws  REFERRING DIAG: M96.1 lumbar post-laminectomy syndrome  Rationale for Evaluation and Treatment Rehabilitation  THERAPY DIAG:  Back pain; weakness ONSET DATE: Jan 2023  SUBJECTIVE:                                                                                                                                                                                           SUBJECTIVE STATEMENT: I did ok after last pool. Less sore than after the first session. I did 2-3 sessions at pool in [place] and I do get sore after but not terrible. Had injection the other day at S1. Ok t to exercise today per MD. I do Have a pain in my glute and my foot is pretty numb still.  PERTINENT HISTORY:  3 years of chronic pain L5-S1 surgery lami Nov 2021 Had covid 3x  PAIN:  Are you having pain? Yes NPRS scale: 5/10 Pain location: Lt glute  Aggravating factors:  lifting (doesn't do more than 10#); pull; raking; prolonged sitting Relieving factors: lying prone; ice    PRECAUTIONS: None  WEIGHT BEARING RESTRICTIONS No  FALLS:  Has patient fallen in last 6 months? No  LIVING ENVIRONMENT: Lives with: lives with their family Lives in: House/apartment  Has following equipment at home: None  OCCUPATION: own 2 businesses Systems analyst, summer camp; Location manager for mental health  PLOF: Independent with basic  ADLs  PATIENT GOALS build muscle; avoid surgery; go back to hiking (outdoor stuff); canoe   OBJECTIVE:   DIAGNOSTIC FINDINGS:  MRI HNP L5-S1  PATIENT SURVEYS:  FOTO 48%  SCREENING FOR RED FLAGS: Bowel or bladder incontinence: No  SENSATION: Light touch: Impaired left lower leg to foot   POSTURE: No Significant postural limitations   LUMBAR ROM:  Extensive movement testing not performed secondary to fearfulness of physical activity Flexion to 50 degrees, extension 10 degrees, sidebending left and right 30 degrees Able to lie prone (pain relieving) TRUNK STRENGTH: Grossly 4/5 with decreased lower abdominal, lumbar multifidi, left glute med strength  LOWER EXTREMITY ROM:   WFLs   LOWER EXTREMITY MMT:  No motor loss in left LE  FUNCTIONAL TESTS:  Able to single leg stand on left with min compensations  GAIT:  Comments: WFLs    TODAY'S TREATMENT   03/24/22:Pt seen for aquatic therapy today.  Treatment took place in water 3.5-4.75 ft in depth at the [facility] pool. Temp of water was 91.  Pt entered/exited the pool via stairs independently with bilat rail.Pt requires buoyancy of water for support and to offload joints with strengthening exercises.   Seated water bench with 75% submersion Pt performed seated LE AROM exercises 20x in all planes with concurrent discussion of pain/status. 75% submersion water walking 6x each direction: pt slow and cautious. Intermittent decompression hang  throughout the following: single leg stance LT: RTLE flex/ext 10x, abd/add 10x. LT single limb stance with UE forward and backward sways 10x. Push single buoy UE weights underwater and hold VC to contract glutes and core holding 10 sec 3x, stand against the wall in post pelvic tilt with blue noodle lat press 2x10. Pt requested to stop at this point as she was getting more sore in her LT glute than she was comfortable with.  03/17/22:Pt seen for aquatic therapy today.  Treatment took place in water 3.5-4.75 ft in depth at the [facility] pool. Temp of water was 91.  Pt entered/exited the pool via stairs independently with bilat rail. Seated water bench with 75% submersion Pt performed seated LE AROM exercises 20x in all planes, concurrent pain assessment. 75% depth water walking 6 lengths each with 30 sec rest standing against the wall. Natural UE movement. Standing single leg stance LT with RTLE movements in 3 directions. VC to contract Lt gluteals more, drops pelvis some. Standing against wall in small squat with half blue noodle lat presses/core compressions 2x5. Horseback bicycle on yellow noodle 1 min 30 sec rest 5x. Seated decompression with yelow nooodle 3 min.   Pt seen for aquatic therapy today.  Treatment took place in water 3.5-4.75 ft in depth at the [facility] pool. Temp of water was 91.  Pt entered/exited the pool via stairs independently with bilat rail.  * Pt shown spinal pressure with different positions chart prior to entering water * without support:  forward/ backward gait, side stepping with straight knees and increased step height * forward / backward marching with/without noodle * straddling noodle with LE suspended in deeper water:  cycling, hip abdct/add; cc ski * noodle under arms at chest with gentle flutter; legs neutral and suspended for spine decompression * holding wall:  hip abdct/ add; hip ext (gentle range)  * back against wall, knee to chest with  blue noodle assist;  noodle stomp (hip flexion to/from neutral and hip abdct with knee flexion to/from neutral)   Pt requires the buoyancy and hydrostatic pressure  of water for support, and to offload joints by unweighting joint load by at least 50 % in navel deep water and by at least 75-80% in chest to neck deep water.  Viscosity of the water is needed for resistance of strengthening. Water current perturbations provides challenge to standing balance requiring increased core activation.  PATIENT EDUCATION:  Education details: aquatic exercise intro Person educated: Patient Education method: Explanation Education comprehension: verbalized understanding   HOME EXERCISE PROGRAM: Verbally assigned decompression (suspended from noodle) in deeper water; walking in chest deep water (forward, backward, side stepping); gentle noodle stomp with back against wall.    ASSESSMENT:  CLINICAL IMPRESSION: Pt arrives post S1 injection with clearance from MD to participate today in aquatic therapy. Pt does report higher soreness in her Lt glute and numbness across her foot/toes. Pt with descent tolerance to movement just beyond 30 minutes then requested to end sessions as she felt any more would make her unacceptably sore tomorrow.        OBJECTIVE IMPAIRMENTS decreased activity tolerance, decreased strength, impaired perceived functional ability, and pain.   ACTIVITY LIMITATIONS carrying, lifting, bending, and squatting  PARTICIPATION LIMITATIONS: community activity, occupation, and yard work  PERSONAL FACTORS Past/current experiences, Time since onset of injury/illness/exacerbation, and 1 comorbidity: previous back surgery  are also affecting patient's functional outcome  Patient lives and works in [place] so we discussed 1x/week for 6-8 sessions   REHAB POTENTIAL: Good  CLINICAL DECISION MAKING: Stable/uncomplicated  EVALUATION COMPLEXITY: Low   GOALS: Goals reviewed with patient?  Yes  SHORT TERM GOALS=LTGs    LONG TERM GOALS: Target date: 04/25/2022  The patient will be independent in a safe self progression of aquatic exercise program to promote further recovery of function   Baseline:  Goal status: INITIAL  2.  The patient will report  pain levels 4/10 with home and work functional activities Goal status:  INITIAL  3.  Trunk strength grossly 4+/5 needed for better tolerance of home and work ADLs Baseline:  Goal status: INITIAL  4.  The patient will have improved FOTO score to    64%   indicating improved function with less pain  Baseline:  Goal status: INITIAL    PLAN: PT FREQUENCY: 1x/week  PT DURATION: 12 weeks  6-8 sessions for independence in aquatic ex program (pt commuting from [place])  PLANNED INTERVENTIONS: Therapeutic exercises, Therapeutic activity, Neuromuscular re-education, Balance training, Gait training, Patient/Family education, Self Care, Joint mobilization, Aquatic Therapy, Dry Needling, Electrical stimulation, Spinal mobilization, Cryotherapy, Moist heat, Ultrasound, Ionotophoresis 4mg /ml Dexamethasone, and Manual therapy.  PLAN FOR NEXT SESSION: Aquatics   , PTA 03/25/22 2:56 PM 03/25/22 2:56 PM

## 2022-03-30 ENCOUNTER — Ambulatory Visit (INDEPENDENT_AMBULATORY_CARE_PROVIDER_SITE_OTHER): Payer: 59 | Admitting: Obstetrics & Gynecology

## 2022-03-30 ENCOUNTER — Encounter: Payer: Self-pay | Admitting: Obstetrics & Gynecology

## 2022-03-30 ENCOUNTER — Other Ambulatory Visit (HOSPITAL_COMMUNITY)
Admission: RE | Admit: 2022-03-30 | Discharge: 2022-03-30 | Disposition: A | Discharge status: Home / Self Care | Payer: 59 | Source: Ambulatory Visit | Attending: Obstetrics & Gynecology | Admitting: Obstetrics & Gynecology

## 2022-03-30 VITALS — BP 118/73 | HR 85 | Ht 63.0 in | Wt 213.0 lb

## 2022-03-30 DIAGNOSIS — Z975 Presence of (intrauterine) contraceptive device: Secondary | ICD-10-CM | POA: Insufficient documentation

## 2022-03-30 DIAGNOSIS — Z01419 Encounter for gynecological examination (general) (routine) without abnormal findings: Secondary | ICD-10-CM

## 2022-03-30 DIAGNOSIS — N921 Excessive and frequent menstruation with irregular cycle: Secondary | ICD-10-CM

## 2022-03-30 NOTE — Patient Instructions (Signed)
Please research progestin IUD vs endometrial ablation for treatment of prolonged menstrual cycle

## 2022-03-30 NOTE — Progress Notes (Signed)
GYNECOLOGY ANNUAL PREVENTATIVE CARE ENCOUNTER NOTE  History:     Helen Wells is a 36 y.o. G2P2  female here for to establish care and for a routine annual gynecologic exam.  Current complaints: prolonged 14 day menstrual cycles for several years.  She has a Paragard in place, placed in 2021.  Always had prolonged periods, even prior to her Paragard. While in [place], she reports trying OCPs and Mirena and felt these made her BP worse. She feels she cannot use use any hormonal treatment. Her previous GYN told her to consider endometrial ablation, she wants to talk about this.   Denies  current abnormal vaginal bleeding, discharge, pelvic pain, problems with intercourse or other gynecologic concerns.    Gynecologic History Patient's last menstrual period was 03/08/2022. Contraception: IUD Last Pap: 2018. Result was normal with negative HPV  Obstetric History OB History  No obstetric history on file.    Past Medical History:  Diagnosis Date   Anemia    Anemia    Anxiety    Clotting disorder (HCC)    Hyperlipemia    Hypertension    Hypertension     Past Surgical History:  Procedure Laterality Date   Gallbladder Removal 2017     lancinectomy and Discectomy 2021     Dr Orlie Pollen - Neurosurgeon    Current Outpatient Medications on File Prior to Visit  Medication Sig Dispense Refill   amLODipine (NORVASC) 5 MG tablet Take 1 tablet (5 mg total) by mouth daily. 90 tablet 2   lisinopril (ZESTRIL) 30 MG tablet Take 1 tablet (30 mg total) by mouth daily. 90 tablet 2   No current facility-administered medications on file prior to visit.    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 Other reaction(s): Other Informed by her MD not to take any Tetracyclines. Informed by her MD not to take any Tetracyclines. Blind    Erythromycin Base     Other reaction(s): Other (See Comments) Other    Macrolides And Ketolides     Other reaction(s): Other (See Comments), Other (See Comments) Blindness, Pseudotumor Cerebri  Blindness, Pseudotumor Cerebri      Social History:  reports that she has never smoked. She has never used smokeless tobacco. She reports that she does not currently use alcohol after a past usage of about 1.0 standard drink of alcohol per week. She reports that she does not use drugs.  Family History  Problem Relation Age of Onset   Hypertension Mother    Arthritis Mother    High blood pressure Mother    Hypertension Father    Birth defects Father    High Cholesterol Father    Learning disabilities Father    Alzheimer's disease Son     The following portions of the patient's history were reviewed and updated as appropriate: allergies, current medications, past family history, past medical history, past social history, past surgical history and problem list.  Review of Systems Pertinent items noted in HPI and remainder of comprehensive ROS otherwise negative.  Physical Exam:  BP 118/73  Pulse 85   Ht 5\' 3"  (1.6 m)   Wt 213 lb (96.6 kg)   LMP 03/08/2022 Comment: long periods- 14 days in length  BMI 37.73 kg/m  CONSTITUTIONAL: Well-developed, well-nourished female in no acute distress.  HENT:  Normocephalic, atraumatic, External right and left ear normal.  EYES: Conjunctivae and EOM are normal. Pupils are equal, round, and reactive to light. No scleral icterus.  NECK: Normal range of motion, supple, no masses.  Normal thyroid.  SKIN: Skin is warm and dry. No rash noted. Not diaphoretic. No erythema. No pallor. MUSCULOSKELETAL: Normal range of motion.  No tenderness.  No cyanosis, clubbing, or edema. NEUROLOGIC: Alert and oriented to person, place, and time. Normal reflexes, muscle tone coordination.  PSYCHIATRIC: Normal mood and affect. Normal behavior. Normal judgment and thought content. CARDIOVASCULAR: Normal heart rate noted, regular rhythm RESPIRATORY: Clear to auscultation bilaterally. Effort and breath sounds normal, no problems with respiration noted. BREASTS: Symmetric in size. No masses, tenderness, skin changes, nipple drainage, or lymphadenopathy bilaterally. Performed in the presence of a chaperone. ABDOMEN: Soft, no distention noted.  No tenderness, rebound or guarding.  PELVIC: Normal appearing external genitalia and urethral meatus; normal appearing vaginal mucosa and cervix. Paragard IUD strings seen, about 2 cm in length.  No abnormal vaginal discharge noted.  Pap smear obtained.  Normal uterine size, no other palpable masses, no uterine or adnexal tenderness.  Performed in the presence of a chaperone.   Assessment and Plan:     1. Prolonged menstrual cycles 2. Paragard IUD (intrauterine device) in place since 2021 Discussed management options. She is allergic to NSAIDs and does not want hormones despite counseling about progestin IUDs not really implicated in raising BP. Having Paragard is also not helping. Will get ultrasound, and then discuss endometrial ablation or other management further.  Hesitant about ablation given her age and possibility of recurrence of symptoms in 4-5 years afterwards.  Of note, patient is done with childbearing, fertility is not a concern.  - 2022 PELVIC COMPLETE WITH TRANSVAGINAL; Future  3. Well woman exam with routine gynecological exam - Cytology - PAP Will follow up results of pap smear and manage accordingly. Routine preventative health maintenance measures emphasized. Please refer to After Visit Summary for other counseling recommendations.      [place], MD, FACOG Obstetrician &  Gynecologist, [facility] for [facility]

## 2022-03-31 ENCOUNTER — Ambulatory Visit: Payer: 59 | Admitting: Physical Therapy

## 2022-04-06 LAB — CYTOLOGY - PAP
Comment: NEGATIVE
Diagnosis: NEGATIVE
Diagnosis: REACTIVE
High risk HPV: NEGATIVE

## 2022-04-07 ENCOUNTER — Ambulatory Visit: Payer: 59 | Admitting: Physical Therapy

## 2022-04-07 ENCOUNTER — Other Ambulatory Visit (HOSPITAL_BASED_OUTPATIENT_CLINIC_OR_DEPARTMENT_OTHER): Payer: Self-pay

## 2022-04-07 ENCOUNTER — Encounter: Payer: Self-pay | Admitting: Family Medicine

## 2022-04-07 ENCOUNTER — Ambulatory Visit: Payer: 59 | Admitting: Family Medicine

## 2022-04-07 VITALS — BP 126/78 | HR 100 | Temp 98.1°F | Resp 18 | Ht 63.0 in | Wt 212.4 lb

## 2022-04-07 DIAGNOSIS — L659 Nonscarring hair loss, unspecified: Secondary | ICD-10-CM

## 2022-04-07 DIAGNOSIS — I10 Essential (primary) hypertension: Secondary | ICD-10-CM

## 2022-04-07 DIAGNOSIS — M7989 Other specified soft tissue disorders: Secondary | ICD-10-CM

## 2022-04-07 MED ORDER — FUROSEMIDE 40 MG PO TABS
40.0000 mg | ORAL_TABLET | Freq: Every day | ORAL | 3 refills | Status: DC | PRN
  Filled 2022-04-07: qty 30, 30d supply, fill #0

## 2022-04-07 NOTE — Progress Notes (Signed)
Chief Complaint  Patient presents with   Hypertension   Follow-up    Subjective Helen Wells is a 36 y.o. female who presents for hypertension follow up. She does not routinely monitor home blood pressures. She is compliant with medications- Norvasc 5 mg/d, lisinopril 30 mg/d. Patient has these side effects of medication: none She is adhering to a healthy diet overall. Current exercise: swimming, in PT for back No CP or SOB.   Hair thinning Over the past year, the patient has had diffuse thinning over the scalp.  She denies any new hair products or new hair styling including pulling her hair tightly.  She does have high levels of stress but does not believe this is causative.  She does have a history of low iron and intermittently abnormal thyroid levels.  She is unsure how many calories she consumes a day but suspects that around 1500.  She is unsure how many grams of protein she consumes.  Hx of LE edema Patient has a history of lower extremity edema in both of her legs.  She does not like to use compression stockings but usually improves with elevation and physical activity.  She was prescribed Lasix in the past to take daily as needed if her legs got really bad.  This would usually help.   Past Medical History:  Diagnosis Date   Anemia    Anemia    Anxiety    Clotting disorder (HCC)    Hyperlipemia    Hypertension    Hypertension     Exam BP 126/78 (BP Location: Right Arm, Patient Position: Sitting, Cuff Size: Normal)   Pulse 100   Temp 98.1 F (36.7 C) (Oral)   Resp 18   Ht 5\' 3"  (1.6 m)   Wt 212 lb 6.4 oz (96.3 kg)   LMP 03/08/2022 Comment: long periods- 14 days in length  SpO2 100%   BMI 37.62 kg/m  General:  well developed, well nourished, in no apparent distress Heart: RRR, no bruits, trace medial ankle LE edema bilaterally Lungs: clear to auscultation, no accessory muscle use Skin: Scalp is without erythema or scaling, diffuse and mild amounts of  thinning on the top of her head/scalp Psych: well oriented with normal range of affect and appropriate judgment/insight  Primary hypertension  Hair thinning - Plan: TSH, T4, free, CBC, Iron, TIBC and Ferritin Panel  Localized swelling of both lower extremities - Plan: furosemide (LASIX) 40 MG tablet  Chronic, stable.  Continue Norvasc 5 mg daily, lisinopril 30 mg daily.  Of note, she had swelling prior to starting the amlodipine.  Counseled on diet and exercise. Chronic, uncontrolled.  Check above labs.  If labs are normal, would consider referral to therapy.  Increase protein intake to 100-120 g of protein daily. Chronic, not controlled.  Will restart Lasix 40 mg daily as needed.  She was not on potassium in the past. F/u in 6 months or as needed. The patient voiced understanding and agreement to the plan.  [place], DO 04/07/22  3:35 PM

## 2022-04-07 NOTE — Addendum Note (Signed)
Addended by: Manuela Schwartz on: 04/07/2022 03:38 PM   Modules accepted: Orders

## 2022-04-07 NOTE — Patient Instructions (Addendum)
Give us 2-3 business days to get the results of your labs back.   Keep the diet clean and stay active.  Let us know if you need anything. 

## 2022-04-08 LAB — CBC
HCT: 40 % (ref 35.0–45.0)
Hemoglobin: 13.3 g/dL (ref 11.7–15.5)
MCH: 28.9 pg (ref 27.0–33.0)
MCHC: 33.3 g/dL (ref 32.0–36.0)
MCV: 86.8 fL (ref 80.0–100.0)
MPV: 10.7 fL (ref 7.5–12.5)
Platelets: 341 10*3/uL (ref 140–400)
RBC: 4.61 10*6/uL (ref 3.80–5.10)
RDW: 14.6 % (ref 11.0–15.0)
WBC: 11 10*3/uL — ABNORMAL HIGH (ref 3.8–10.8)

## 2022-04-08 LAB — IRON,TIBC AND FERRITIN PANEL
%SAT: 16 % (calc) (ref 16–45)
Ferritin: 10 ng/mL — ABNORMAL LOW (ref 16–154)
Iron: 75 ug/dL (ref 40–190)
TIBC: 476 mcg/dL (calc) — ABNORMAL HIGH (ref 250–450)

## 2022-04-08 LAB — T4, FREE: Free T4: 1.2 ng/dL (ref 0.8–1.8)

## 2022-04-08 LAB — TSH: TSH: 2.68 mIU/L

## 2022-04-10 ENCOUNTER — Other Ambulatory Visit (HOSPITAL_BASED_OUTPATIENT_CLINIC_OR_DEPARTMENT_OTHER): Payer: Self-pay

## 2022-04-13 ENCOUNTER — Encounter: Payer: Self-pay | Admitting: Family Medicine

## 2022-04-13 ENCOUNTER — Other Ambulatory Visit (HOSPITAL_BASED_OUTPATIENT_CLINIC_OR_DEPARTMENT_OTHER): Payer: Self-pay

## 2022-04-13 ENCOUNTER — Ambulatory Visit: Payer: 59 | Admitting: Family Medicine

## 2022-04-13 VITALS — BP 114/66 | HR 73 | Temp 98.6°F | Resp 16 | Wt 215.0 lb

## 2022-04-13 DIAGNOSIS — F321 Major depressive disorder, single episode, moderate: Secondary | ICD-10-CM

## 2022-04-13 DIAGNOSIS — F411 Generalized anxiety disorder: Secondary | ICD-10-CM | POA: Diagnosis not present

## 2022-04-13 MED ORDER — SERTRALINE HCL 50 MG PO TABS
50.0000 mg | ORAL_TABLET | Freq: Every day | ORAL | 3 refills | Status: DC
  Filled 2022-04-13: qty 30, 30d supply, fill #0

## 2022-04-13 NOTE — Progress Notes (Signed)
Chief Complaint  Patient presents with   Anxiety    Patient here for increased anxiety symptoms    Subjective Helen Wells is an 36 y.o. female who presents with anxiety/depression Symptoms began 15 yrs ago, worse 5 years ago after hurting back. Anxiety symptoms:  Depressive symptoms depressed mood,. Family history significant for none known.  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  She is not following with a psychologist.  Past Medical History:  Diagnosis Date   Anemia    Anemia    Anxiety    Clotting disorder ([place])    Hyperlipemia    Hypertension    Hypertension      Family History Family History  Problem Relation Age of Onset   Hypertension Mother    Arthritis Mother    High blood pressure Mother    Hypertension Father    Birth defects Father    High Cholesterol Father    Learning disabilities Father    Alzheimer's disease Son     Exam BP 114/66 (BP Location: Right Arm, Patient Position: Sitting, Cuff Size: Large)   Pulse 73   Temp 98.6 F (37 C) (Oral)   Resp 16   Wt 215 lb (97.5 kg)   LMP 03/08/2022 Comment: long periods- 14 days in length  SpO2 100%   BMI 38.09 kg/m  General:  well developed, well nourished, in no apparent distress Lungs:  normal respiratory effort without accessory muscle use Psych: well oriented with normal range of affect and age-appropriate judgement/insight; did become tearful during exam.  Assessment and Plan  GAD (generalized anxiety disorder) - Plan: sertraline (ZOLOFT) 50 MG tablet  Depression, major, single episode, moderate ([place]) - Plan: sertraline (ZOLOFT) 50 MG tablet  Chronic, uncontrolled. Start Zoloft 25 mg/d for 2 weeks and then increase to 50 mg/d. Counseled on adjunctive treatment with exercise/physical  activity. Counseling resources provided in AVS. F/u in 1 mo. Patient voiced understanding and agreement to the plan.  [place], DO 04/13/22 9:15 AM

## 2022-04-13 NOTE — Patient Instructions (Signed)
Aim to do some physical exertion for 150 minutes per week. This is typically divided into 5 days per week, 30 minutes per day. The activity should be enough to get your heart rate up. Anything is better than nothing if you have time constraints. ° °Please consider counseling. Contact [phone] to schedule an appointment or inquire about cost/insurance coverage. ° °[facility] located at [address], [place], [place].  Phone number = [phone].  Dr. Onoriode Edeh - Adult Psychiatry.  °  °[facility] located at [address], [place], [place]. Phone number = [phone]. °  °[facility] located at [address], [place], [place].  Phone number = [phone]. °  °[facility] located at [address], [place], [place].  Phone number = [phone]. ° °Coping skills °Choose 5 that work for you: °Take a deep breath °Count to 20 °Read a book °Do a puzzle °Meditate °Bake °Sing °Knit °Garden °Pray °Go outside °Call a friend °Listen to music °Take a walk °Color °Send a note °Take a bath °Watch a movie °Be alone in a quiet place °Pet an animal °Visit a friend °Journal °Exercise °Stretch  ° °Let us know if you need anything. °

## 2022-04-14 ENCOUNTER — Encounter: Payer: Self-pay | Admitting: Physical Therapy

## 2022-04-14 ENCOUNTER — Ambulatory Visit: Payer: 59 | Admitting: Physical Therapy

## 2022-04-14 DIAGNOSIS — M5417 Radiculopathy, lumbosacral region: Secondary | ICD-10-CM

## 2022-04-14 DIAGNOSIS — M5459 Other low back pain: Secondary | ICD-10-CM

## 2022-04-14 DIAGNOSIS — M6281 Muscle weakness (generalized): Secondary | ICD-10-CM

## 2022-04-14 NOTE — Therapy (Signed)
OUTPATIENT PHYSICAL THERAPY THORACOLUMBAR TREATMENT NOTE   Patient Name: Helen Wells MRN: [id number] DOB:05/20/1986, 36 y.o., female Today's Date: 04/14/2022   PT End of Session - 04/14/22 1958     Visit Number 5    Date for PT Re-Evaluation 04/25/22    Authorization Type [facility]    PT Start Time 1510    PT Stop Time 1550    PT Time Calculation (min) 40 min    Activity Tolerance Patient tolerated treatment well    Behavior During Therapy [facility] for tasks assessed/performed               Past Medical History:  Diagnosis Date   Anemia    Anemia    Anxiety    Clotting disorder (HCC)    Hyperlipemia    Hypertension    Hypertension    Past Surgical History:  Procedure Laterality Date   Gallbladder Removal 2017     lancinectomy and Discectomy 2021     Dr Orlie Pollen - Neurosurgeon   Patient Active Problem List   Diagnosis Date Noted   GAD (generalized anxiety disorder) 04/13/2022   Localized swelling of both lower extremities 04/07/2022   Hair thinning 04/07/2022   Paragard IUD (intrauterine device) in place since 2021 03/30/2022   Prolonged menstrual cycles 03/30/2022   Lumbar radiculitis 03/23/2022   Primary hypertension 10/04/2021   Iron deficiency anemia 10/04/2021    PCP: Sharlene Dory DO  REFERRING PROVIDER: Dr. Claudette Laws  REFERRING DIAG: M96.1 lumbar post-laminectomy syndrome  Rationale for Evaluation and Treatment Rehabilitation  THERAPY DIAG:  Back pain; weakness ONSET DATE: Jan 2023  SUBJECTIVE:                                                                                                                                                                                           SUBJECTIVE STATEMENT: I should have not exercised after my injection, I got really flared up and had to cancel last week. Now I am much better and I haven't had any leg pain when I lay down in bed and I have feeling in my pinkie toe.   PERTINENT  HISTORY:  3 years of chronic pain L5-S1 surgery lami Nov 2021 Had covid 3x  PAIN:  Are you having pain? Yes NPRS scale: 3/10 Pain location: Lt glute  Aggravating factors: lifting (doesn't do more than 10#); pull; raking; prolonged sitting Relieving factors: lying prone; ice    PRECAUTIONS: None  WEIGHT BEARING RESTRICTIONS No  FALLS:  Has patient fallen in last 6 months? 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  PLOF: Independent with basic ADLs  PATIENT GOALS build muscle; avoid surgery; go back to hiking (outdoor stuff); canoe   OBJECTIVE:   DIAGNOSTIC FINDINGS:  MRI HNP L5-S1  PATIENT SURVEYS:  FOTO 48%  SCREENING FOR RED FLAGS: Bowel or bladder incontinence: No  SENSATION: Light touch: Impaired left lower leg to foot   POSTURE: No Significant postural limitations   LUMBAR ROM:  Extensive movement testing not performed secondary to fearfulness of physical activity Flexion to 50 degrees, extension 10 degrees, sidebending left and right 30 degrees Able to lie prone (pain relieving) TRUNK STRENGTH: Grossly 4/5 with decreased lower abdominal, lumbar multifidi, left glute med strength  LOWER EXTREMITY ROM:   WFLs   LOWER EXTREMITY MMT:  No motor loss in left LE  FUNCTIONAL TESTS:  Able to single leg stand on left with min compensations  GAIT:  Comments: WFLs    TODAY'S TREATMENT   04/14/22::Pt seen for aquatic therapy today.  Treatment took place in water 3.5-4.75 ft in depth at the [facility] pool. Temp of water was 91.  Pt entered/exited the pool via stairs independently with bilat rail. Seated water bench with 75% submersion Pt performed seated LE AROM exercises 20x in all planes, concurrent pain assessment. 75% water walking 10 in each  direction natural arm movements. Hold weights under water and hold 5 sec 10x, Vc for core contraction, supine iron cross with yellow weights 20 sec, squats 2x10, trial of plank on bench but stopped due to pain and tightness in back even when PTA attempted to support pelvis. Lunges 10x Bil, Horseback bicycle 2 min with intermittent decompression.     03/24/22:Pt seen for aquatic therapy today.  Treatment took place in water 3.5-4.75 ft in depth at the [facility] pool. Temp of water was 91.  Pt entered/exited the pool via stairs independently with bilat rail.Pt requires buoyancy of water for support and to offload joints with strengthening exercises.   Seated water bench with 75% submersion Pt performed seated LE AROM exercises 20x in all planes with concurrent discussion of pain/status. 75% submersion water walking 6x each direction: pt slow and cautious. Intermittent decompression hang throughout the following: single leg stance LT: RTLE flex/ext 10x, abd/add 10x. LT single limb stance with UE forward and backward sways 10x. Push single buoy UE weights underwater and hold VC to contract glutes and core holding 10 sec 3x, stand against the wall in post pelvic tilt with blue noodle lat press 2x10. Pt requested to stop at this point as she was getting more sore in her LT glute than she was comfortable with.  03/17/22:Pt seen for aquatic therapy today.  Treatment took place in water 3.5-4.75 ft in depth at the [facility] pool. Temp of water was 91.  Pt entered/exited the pool via stairs independently with bilat rail. Seated water bench with 75% submersion Pt performed seated LE AROM exercises 20x in all planes, concurrent pain assessment. 75% depth water walking 6 lengths each with 30 sec rest standing against the wall. Natural UE movement. Standing single leg stance LT with RTLE movements in 3 directions. VC to contract Lt gluteals more, drops pelvis some. Standing against wall in small  squat with half blue noodle lat presses/core compressions 2x5. Horseback bicycle on yellow noodle 1 min 30 sec rest 5x. Seated decompression with yelow nooodle 3 min.   Pt seen  for aquatic therapy today.  Treatment took place in water 3.5-4.75 ft in depth at the [facility] pool. Temp of water was 91.  Pt entered/exited the pool via stairs independently with bilat rail.  * Pt shown spinal pressure with different positions chart prior to entering water * without support:  forward/ backward gait, side stepping with straight knees and increased step height * forward / backward marching with/without noodle * straddling noodle with LE suspended in deeper water:  cycling, hip abdct/add; cc ski * noodle under arms at chest with gentle flutter; legs neutral and suspended for spine decompression * holding wall:  hip abdct/ add; hip ext (gentle range)  * back against wall, knee to chest with blue noodle assist;  noodle stomp (hip flexion to/from neutral and hip abdct with knee flexion to/from neutral)   Pt requires the buoyancy and hydrostatic pressure of water for support, and to offload joints by unweighting joint load by at least 50 % in navel deep water and by at least 75-80% in chest to neck deep water.  Viscosity of the water is needed for resistance of strengthening. Water current perturbations provides challenge to standing balance requiring increased core activation.  PATIENT EDUCATION:  Education details: aquatic exercise intro Person educated: Patient Education method: Explanation Education comprehension: verbalized understanding   HOME EXERCISE PROGRAM: Verbally assigned decompression (suspended from noodle) in deeper water; walking in chest deep water (forward, backward, side stepping); gentle noodle stomp with back against wall.    ASSESSMENT:  CLINICAL IMPRESSION: Pt arrives with some mild pain. Pt reports she can now lay in her bed without leg pain and her 5th toe has  feeling again. Pt had to take 1 1/2 weeks off her pool exercises to help her back and buttock pain calm down post exercising too soon after her injection. Pt has most difficulty in the prone position (plank, swimming) due to inability to stay out of excessive lumbar lordosis due to decreased core strength.     OBJECTIVE IMPAIRMENTS decreased activity tolerance, decreased strength, impaired perceived functional ability, and pain.   ACTIVITY LIMITATIONS carrying, lifting, bending, and squatting  PARTICIPATION LIMITATIONS: community activity, occupation, and yard work  PERSONAL FACTORS Past/current experiences, Time since onset of injury/illness/exacerbation, and 1 comorbidity: previous back surgery  are also affecting patient's functional outcome  Patient lives and works in [place] so we discussed 1x/week for 6-8 sessions   REHAB POTENTIAL: Good  CLINICAL DECISION MAKING: Stable/uncomplicated  EVALUATION COMPLEXITY: Low   GOALS: Goals reviewed with patient? Yes  SHORT TERM GOALS=LTGs    LONG TERM GOALS: Target date: 04/25/2022  The patient will be independent in a safe self progression of aquatic exercise program to promote further recovery of function   Baseline:  Goal status: INITIAL  2.  The patient will report  pain levels 4/10 with home and work functional activities Goal status:  INITIAL  3.  Trunk strength grossly 4+/5 needed for better tolerance of home and work ADLs Baseline:  Goal status: INITIAL  4.  The patient will have improved FOTO score to    64%   indicating improved function with less pain  Baseline:  Goal status: INITIAL    PLAN: PT FREQUENCY: 1x/week  PT DURATION: 12 weeks  6-8 sessions for independence in aquatic ex program (pt commuting from [place])  PLANNED INTERVENTIONS: Therapeutic exercises, Therapeutic activity, Neuromuscular re-education, Balance training, Gait training, Patient/Family education, Self Care, Joint mobilization, Aquatic  Therapy, Dry Needling, Electrical stimulation, Spinal mobilization, Cryotherapy, Moist  heat, Ultrasound, Ionotophoresis 4mg /ml Dexamethasone, and Manual therapy.  PLAN FOR NEXT SESSION: Aquatics   , PTA 04/14/22 8:44 PM 04/14/22 8:44 PM

## 2022-04-19 ENCOUNTER — Ambulatory Visit (HOSPITAL_BASED_OUTPATIENT_CLINIC_OR_DEPARTMENT_OTHER)
Admission: RE | Admit: 2022-04-19 | Discharge: 2022-04-19 | Disposition: A | Discharge status: Home / Self Care | Payer: 59 | Source: Ambulatory Visit | Attending: Obstetrics & Gynecology | Admitting: Obstetrics & Gynecology

## 2022-04-19 DIAGNOSIS — Z975 Presence of (intrauterine) contraceptive device: Secondary | ICD-10-CM | POA: Diagnosis present

## 2022-04-19 DIAGNOSIS — N921 Excessive and frequent menstruation with irregular cycle: Secondary | ICD-10-CM | POA: Insufficient documentation

## 2022-04-20 ENCOUNTER — Encounter: Payer: Self-pay | Admitting: Family Medicine

## 2022-04-20 NOTE — Therapy (Signed)
OUTPATIENT PHYSICAL THERAPY THORACOLUMBAR TREATMENT NOTE   Patient Name: Helen Wells MRN: [id number] DOB:28-Dec-1985, 36 y.o., female Today's Date: 04/21/2022   PT End of Session - 04/21/22 1346     Visit Number 6    Date for PT Re-Evaluation 04/25/22    Authorization Type [facility]    PT Start Time 1345    PT Stop Time 1425    PT Time Calculation (min) 40 min    Activity Tolerance Patient tolerated treatment well    Behavior During Therapy [facility] for tasks assessed/performed                Past Medical History:  Diagnosis Date   Anemia    Anemia    Anxiety    Clotting disorder (HCC)    Hyperlipemia    Hypertension    Hypertension    Past Surgical History:  Procedure Laterality Date   Gallbladder Removal 2017     lancinectomy and Discectomy 2021     Dr Orlie Pollen - Neurosurgeon   Patient Active Problem List   Diagnosis Date Noted   GAD (generalized anxiety disorder) 04/13/2022   Localized swelling of both lower extremities 04/07/2022   Hair thinning 04/07/2022   Paragard IUD (intrauterine device) in place since 2021 03/30/2022   Prolonged menstrual cycles 03/30/2022   Lumbar radiculitis 03/23/2022   Primary hypertension 10/04/2021   Iron deficiency anemia 10/04/2021    PCP: Sharlene Dory DO  REFERRING PROVIDER: Dr. Claudette Laws  REFERRING DIAG: M96.1 lumbar post-laminectomy syndrome  Rationale for Evaluation and Treatment Rehabilitation  THERAPY DIAG:  Back pain; weakness ONSET DATE: Jan 2023  SUBJECTIVE:                                                                                                                                                                                           SUBJECTIVE STATEMENT: I did hurt for a day after last pool. I think trying the plank was too much. Took some medicine and was better.  PERTINENT HISTORY:  3 years of chronic pain L5-S1 surgery lami Nov 2021 Had covid 3x  PAIN:  Are you  having pain? No NPRS scale: 0/10 Pain location:   Aggravating factors: lifting (doesn't do more than 10#); pull; raking; prolonged sitting Relieving factors: lying prone; ice    PRECAUTIONS: None  WEIGHT BEARING RESTRICTIONS No  FALLS:  Has patient fallen in last 6 months? No  LIVING ENVIRONMENT: Lives with: lives with their family Lives in: House/apartment  Has following equipment at home: None  OCCUPATION: own 2 businesses Systems analyst, summer camp; Location manager for mental health  PLOF:  Independent with basic ADLs  PATIENT GOALS build muscle; avoid surgery; go back to hiking (outdoor stuff); canoe   OBJECTIVE:   DIAGNOSTIC FINDINGS:  MRI HNP L5-S1  PATIENT SURVEYS:  FOTO 48%  SCREENING FOR RED FLAGS: Bowel or bladder incontinence: No  SENSATION: Light touch: Impaired left lower leg to foot   POSTURE: No Significant postural limitations   LUMBAR ROM:  Extensive movement testing not performed secondary to fearfulness of physical activity Flexion to 50 degrees, extension 10 degrees, sidebending left and right 30 degrees Able to lie prone (pain relieving) TRUNK STRENGTH: Grossly 4/5 with decreased lower abdominal, lumbar multifidi, left glute med strength  LOWER EXTREMITY ROM:   WFLs   LOWER EXTREMITY MMT:  No motor loss in left LE  FUNCTIONAL TESTS:  Able to single leg stand on left with min compensations  GAIT:  Comments: WFLs    TODAY'S TREATMENT   04/21/22: Pt seen for aquatic therapy today.  Treatment took place in water 3.5-4.75 ft in depth at the [facility] pool. Temp of water was 91.  Pt entered/exited the pool via stairs independently with bilat rail. Seated water bench with 75% submersion Pt performed seated LE AROM exercises 20x in all planes, concurrent pain assessment. 75% water walking 10x in each direction natural arm movements.Single bouy shoulder horizontal add/abd 2x10 with core contracted.  Hold  weights under water and hold 5 sec 10x, squats 2x10, half noodle lat press 3x10, Lunges 10x Bil, Horseback bicycle 2 min x2  with intermittent decompression.     04/14/22::Pt seen for aquatic therapy today.  Treatment took place in water 3.5-4.75 ft in depth at the [facility] pool. Temp of water was 91.  Pt entered/exited the pool via stairs independently with bilat rail. Seated water bench with 75% submersion Pt performed seated LE AROM exercises 20x in all planes, concurrent pain assessment. 75% water walking 10 in each direction natural arm movements. Hold weights under water and hold 5 sec 10x, Vc for core contraction, supine iron cross with yellow weights 20 sec, squats 2x10, trial of plank on bench but stopped due to pain and tightness in back even when PTA attempted to support pelvis. Lunges 10x Bil, Horseback bicycle 2 min with intermittent decompression.     03/24/22:Pt seen for aquatic therapy today.  Treatment took place in water 3.5-4.75 ft in depth at the [facility] pool. Temp of water was 91.  Pt entered/exited the pool via stairs independently with bilat rail.Pt requires buoyancy of water for support and to offload joints with strengthening exercises.   Seated water bench with 75% submersion Pt performed seated LE AROM exercises 20x in all planes with concurrent discussion of pain/status. 75% submersion water walking 6x each direction: pt slow and cautious. Intermittent decompression hang throughout the following: single leg stance LT: RTLE flex/ext 10x, abd/add 10x. LT single limb stance with UE forward and backward sways 10x. Push single buoy UE weights underwater and hold VC to contract glutes and core holding 10 sec 3x, stand against the wall in post pelvic tilt with blue noodle lat press 2x10. Pt requested to stop at this point as she was getting more sore in her LT glute than she was comfortable with.   PATIENT EDUCATION:  Education details: aquatic exercise  intro Person educated: Patient Education method: Explanation Education comprehension: verbalized understanding   HOME EXERCISE PROGRAM: Verbally assigned decompression (suspended from noodle) in deeper water; walking in chest deep water (forward, backward, side stepping); gentle noodle stomp  with back against wall.    ASSESSMENT:  CLINICAL IMPRESSION: Pt arrives pain free today for aquatics. Pt was only able to get to the pool 1x this week. She did have small increase in back pain after last week in PT. Medicine helps relieve. Pt comfortable with current level of HEP.   OBJECTIVE IMPAIRMENTS decreased activity tolerance, decreased strength, impaired perceived functional ability, and pain.   ACTIVITY LIMITATIONS carrying, lifting, bending, and squatting  PARTICIPATION LIMITATIONS: community activity, occupation, and yard work  PERSONAL FACTORS Past/current experiences, Time since onset of injury/illness/exacerbation, and 1 comorbidity: previous back surgery  are also affecting patient's functional outcome  Patient lives and works in [place] so we discussed 1x/week for 6-8 sessions   REHAB POTENTIAL: Good  CLINICAL DECISION MAKING: Stable/uncomplicated  EVALUATION COMPLEXITY: Low   GOALS: Goals reviewed with patient? Yes  SHORT TERM GOALS=LTGs    LONG TERM GOALS: Target date: 04/25/2022  The patient will be independent in a safe self progression of aquatic exercise program to promote further recovery of function   Baseline:  Goal status: INITIAL  2.  The patient will report  pain levels 4/10 with home and work functional activities Goal status:  INITIAL  3.  Trunk strength grossly 4+/5 needed for better tolerance of home and work ADLs Baseline:  Goal status: INITIAL  4.  The patient will have improved FOTO score to    64%   indicating improved function with less pain  Baseline:  Goal status: INITIAL    PLAN: PT FREQUENCY: 1x/week  PT DURATION: 12 weeks  6-8  sessions for independence in aquatic ex program (pt commuting from [place])  PLANNED INTERVENTIONS: Therapeutic exercises, Therapeutic activity, Neuromuscular re-education, Balance training, Gait training, Patient/Family education, Self Care, Joint mobilization, Aquatic Therapy, Dry Needling, Electrical stimulation, Spinal mobilization, Cryotherapy, Moist heat, Ultrasound, Ionotophoresis 4mg /ml Dexamethasone, and Manual therapy.  PLAN FOR NEXT SESSION:ERO next   , PTA 04/21/22 10:44 PM 04/21/22 10:44 PM

## 2022-04-21 ENCOUNTER — Encounter: Payer: Self-pay | Admitting: Physical Therapy

## 2022-04-21 ENCOUNTER — Ambulatory Visit: Payer: 59 | Attending: Physical Medicine & Rehabilitation | Admitting: Physical Therapy

## 2022-04-21 DIAGNOSIS — M5417 Radiculopathy, lumbosacral region: Secondary | ICD-10-CM | POA: Insufficient documentation

## 2022-04-21 DIAGNOSIS — M5459 Other low back pain: Secondary | ICD-10-CM | POA: Insufficient documentation

## 2022-04-21 DIAGNOSIS — M6281 Muscle weakness (generalized): Secondary | ICD-10-CM | POA: Insufficient documentation

## 2022-04-24 ENCOUNTER — Ambulatory Visit: Payer: 59 | Admitting: Obstetrics & Gynecology

## 2022-04-25 ENCOUNTER — Ambulatory Visit: Payer: 59 | Admitting: Physical Therapy

## 2022-04-25 DIAGNOSIS — M5459 Other low back pain: Secondary | ICD-10-CM | POA: Diagnosis not present

## 2022-04-25 DIAGNOSIS — M6281 Muscle weakness (generalized): Secondary | ICD-10-CM

## 2022-04-25 DIAGNOSIS — M5417 Radiculopathy, lumbosacral region: Secondary | ICD-10-CM

## 2022-04-25 NOTE — Therapy (Signed)
OUTPATIENT PHYSICAL THERAPY THORACOLUMBAR TREATMENT NOTE/DISCHARGE SUMMARY   Patient Name: Helen Wells MRN: [id number] DOB:Nov 17, 1985, 36 y.o., female Today's Date: 04/25/2022   PT End of Session - 04/25/22 1228     Visit Number 7    Date for PT Re-Evaluation 06/20/22    Authorization Type [facility]    PT Start Time 1230    PT Stop Time 7672   PT Time Calculation (min) 23 min    Activity Tolerance Patient tolerated treatment well                Past Medical History:  Diagnosis Date   Anemia    Anemia    Anxiety    Clotting disorder ([place])    Hyperlipemia    Hypertension    Hypertension    Past Surgical History:  Procedure Laterality Date   Gallbladder Removal 2017     lancinectomy and Discectomy 0947     Dr Cathren Laine - Neurosurgeon   Patient Active Problem List   Diagnosis Date Noted   GAD (generalized anxiety disorder) 04/13/2022   Localized swelling of both lower extremities 04/07/2022   Hair thinning 04/07/2022   Paragard IUD (intrauterine device) in place since 2021 03/30/2022   Prolonged menstrual cycles 03/30/2022   Lumbar radiculitis 03/23/2022   Primary hypertension 10/04/2021   Iron deficiency anemia 10/04/2021    PCP: Shelda Pal DO  REFERRING PROVIDER: Dr. Alysia Penna  REFERRING DIAG: M96.1 lumbar post-laminectomy syndrome  Rationale for Evaluation and Treatment Rehabilitation  THERAPY DIAG:  Back pain; weakness ONSET DATE: Jan 2023  SUBJECTIVE:                                                                                                                                                                                           SUBJECTIVE STATEMENT: I think the aquatic PT has really helped.   I have enough to work on.  I found muscles in my lower back that were not moving. I've seen a lot of improvement.  I have sensation in my pinky toe now,  now pain is intermittent rather than constant.  50% better overall.     PERTINENT HISTORY:  3 years of chronic pain L5-S1 surgery lami Nov 2021 Had covid 3x  PAIN:  Are you having pain? No NPRS scale: 0/10 Pain location:   Aggravating factors: lifting (doesn't do more than 10#); pull; raking; prolonged sitting Relieving factors: lying prone; ice    PRECAUTIONS: None  WEIGHT BEARING RESTRICTIONS No  FALLS:  Has patient fallen in last 6 months? 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  PLOF: Independent with basic ADLs  PATIENT GOALS build muscle; avoid surgery; go back to hiking (outdoor stuff); canoe   OBJECTIVE:   DIAGNOSTIC FINDINGS:  MRI HNP L5-S1  PATIENT SURVEYS:  FOTO 48%  10/10:  60%  SCREENING FOR RED FLAGS: Bowel or bladder incontinence: No  SENSATION: Light touch: Impaired left lower leg to foot   POSTURE: No Significant postural limitations   LUMBAR ROM:  Extensive movement testing not performed secondary to fearfulness of physical activity Flexion to 50 degrees, extension 10 degrees, sidebending left and right 30 degrees Able to lie prone (pain relieving)  10/10:  flexion 65, extension 15, sidebending 30 bil TRUNK STRENGTH: 10/10: Grossly 4/5 to 4+/5 with decreased lower abdominal, lumbar multifidi, left glute med strength  LOWER EXTREMITY ROM:   WFLs   LOWER EXTREMITY MMT:  No motor loss in left LE  FUNCTIONAL TESTS:  Able to single leg stand on left with min compensations  GAIT:  Comments: WFLs    TODAY'S TREATMENT  10/10: FOTO Strength assessment in standing ROM trunk Discussion of aquatic ex program Discussion of plan to discharge     04/21/22: Pt seen for aquatic therapy today.  Treatment took place in water 3.5-4.75 ft in depth at the [facility] pool. Temp of water was 91.  Pt  entered/exited the pool via stairs independently with bilat rail. Seated water bench with 75% submersion Pt performed seated LE AROM exercises 20x in all planes, concurrent pain assessment. 75% water walking 10x in each direction natural arm movements.Single bouy shoulder horizontal add/abd 2x10 with core contracted.  Hold weights under water and hold 5 sec 10x, squats 2x10, half noodle lat press 3x10, Lunges 10x Bil, Horseback bicycle 2 min x2  with intermittent decompression.      PATIENT EDUCATION:  Education details: aquatic exercise intro Person educated: Patient Education method: Explanation Education comprehension: verbalized understanding   HOME EXERCISE PROGRAM: Verbally assigned decompression (suspended from noodle) in deeper water; walking in chest deep water (forward, backward, side stepping); gentle noodle stomp with back against wall.    ASSESSMENT:  CLINICAL IMPRESSION: The patient reports a significant improvement in pain intensity and frequency since start of care and the return of some sensation in her left toe.  She is independent in an aquatic ex program and feels ready to [facility] [place].  The patient has met the majority of rehab goals, with noted improvements in pain reduction, outcome score, ROM, strength and functional mobility.  Recommend discharge from PT at this time.    Average pain 3/10.   Pt arrives pain free today for aquatics. Pt was only able to get to the pool 1x this week. She did have small increase in back pain after last week in PT. Medicine helps relieve. Pt comfortable with current level of HEP.   OBJECTIVE IMPAIRMENTS decreased activity tolerance, decreased strength, impaired perceived functional ability, and pain.   ACTIVITY LIMITATIONS carrying, lifting, bending, and squatting  PARTICIPATION LIMITATIONS: community activity, occupation, and yard work  PERSONAL FACTORS Past/current experiences, Time since  onset of injury/illness/exacerbation, and 1 comorbidity: previous back surgery  are also affecting patient's functional outcome  Patient lives and works in [place] so we discussed [place] for 6-8 sessions   REHAB POTENTIAL: Good  CLINICAL DECISION MAKING: Stable/uncomplicated  EVALUATION COMPLEXITY: Low   GOALS: Goals reviewed with  patient? Yes  SHORT TERM GOALS=LTGs    LONG TERM GOALS: Target date: 04/25/2022  The patient will be independent in a safe self progression of aquatic exercise program to promote further recovery of function   Baseline:  Goal status: goal met   2.  The patient will report  pain levels 4/10 with home and work functional activities Goal status:  goal met    3.  Trunk strength grossly 4+/5 needed for better tolerance of home and work ADLs Baseline:  Goal status: partially met  4.  The patient will have improved FOTO score to    64%   indicating improved function with less pain  Baseline:  Goal status: partially met    PHYSICAL THERAPY DISCHARGE SUMMARY  Visits from Start of Care: 7  Current functional level related to goals / functional outcomes: See clinical impressions above   Remaining deficits: As above   Education / Equipment: Aquatic ex program   Patient agrees to discharge. Patient goals were partially met. Patient is being discharged due to meeting the stated rehab goals.   Ruben Im, PT 04/25/22 1:06 PM Phone: [phone] Fax: [phone]

## 2022-04-27 ENCOUNTER — Ambulatory Visit: Payer: 59 | Admitting: Family Medicine

## 2022-05-11 ENCOUNTER — Encounter: Payer: 59 | Attending: Physical Medicine & Rehabilitation | Admitting: Physical Medicine & Rehabilitation

## 2022-05-11 DIAGNOSIS — M5416 Radiculopathy, lumbar region: Secondary | ICD-10-CM | POA: Insufficient documentation

## 2022-05-16 ENCOUNTER — Ambulatory Visit: Payer: 59 | Admitting: Family Medicine

## 2022-05-16 ENCOUNTER — Other Ambulatory Visit (HOSPITAL_BASED_OUTPATIENT_CLINIC_OR_DEPARTMENT_OTHER): Payer: Self-pay

## 2022-05-16 ENCOUNTER — Encounter: Payer: Self-pay | Admitting: Family Medicine

## 2022-05-16 VITALS — BP 138/86 | HR 87 | Temp 98.1°F | Ht 63.0 in | Wt 217.1 lb

## 2022-05-16 DIAGNOSIS — F411 Generalized anxiety disorder: Secondary | ICD-10-CM

## 2022-05-16 DIAGNOSIS — I10 Essential (primary) hypertension: Secondary | ICD-10-CM | POA: Diagnosis not present

## 2022-05-16 MED ORDER — LISINOPRIL 40 MG PO TABS
40.0000 mg | ORAL_TABLET | Freq: Every day | ORAL | 3 refills | Status: DC
  Filled 2022-05-16: qty 90, 90d supply, fill #0
  Filled 2022-08-20: qty 90, 90d supply, fill #1
  Filled 2022-11-13: qty 90, 90d supply, fill #2

## 2022-05-16 NOTE — Progress Notes (Signed)
Chief Complaint  Patient presents with   Follow-up    Zoloft did not work and made her sick   Edema    Subjective Helen Wells presents for f/u anxiety/depression.  Pt was being treated with Zoloft- it made her dizzy, increased BP, N/V.  Reports no improvement since treatment. No thoughts of harming self or others. No self-medication with alcohol, prescription drugs or illicit drugs. Pt is not following with a counselor/psychologist. She has an appt this week.  BP's running in 140/100s, compliant w lisinopril 30 mg/d, Norvasc 5 mg/d. Walking, eating healthy. No CP or SOB.   Past Medical History:  Diagnosis Date   Anemia    Anemia    Anxiety    Clotting disorder ([place])    Hyperlipemia    Hypertension    Hypertension    Allergies as of 05/16/2022       Reactions   Bupivacaine-meloxicam Er    Other reaction(s): Unknown   Erythromycin    Blind Other reaction(s): Other Pt states it causes her to go blind Blind   Meloxicam    Stomach Ulcers Other reaction(s): Other (See Comments) ulcer Stomach Ulcers ulcer   Tetracyclines & Related Rash   Blind Other reaction(s): Other (See Comments) Informed by her MD not to take any Tetracyclines. Other reaction(s): Other Informed by her MD not to take any Tetracyclines. Informed by her MD not to take any Tetracyclines. Blind   Erythromycin Base    Other reaction(s): Other (See Comments) Other    Macrolides And Ketolides    Other reaction(s): Other (See Comments), Other (See Comments) Blindness, Pseudotumor Cerebri  Blindness, Pseudotumor Cerebri         Medication List        Accurate as of May 16, 2022  9:31 AM. If you have any questions, ask your nurse or doctor.          STOP taking these medications    sertraline 50 MG tablet Commonly known as: ZOLOFT Stopped by: Shelda Pal, DO       TAKE these medications    amLODipine 5 MG tablet Commonly known as: NORVASC Take 1 tablet  (5 mg total) by mouth daily.   furosemide 40 MG tablet Commonly known as: LASIX Take 1 tablet (40 mg total) by mouth daily as needed for edema.   lisinopril 40 MG tablet Commonly known as: ZESTRIL Take 1 tablet (40 mg total) by mouth daily. What changed:  medication strength how much to take Changed by: Shelda Pal, DO        Exam BP 138/86 (BP Location: Left Arm, Cuff Size: Normal)   Pulse 87   Temp 98.1 F (36.7 C) (Oral)   Ht 5\' 3"  (1.6 m)   Wt 217 lb 2 oz (98.5 kg)   SpO2 99%   BMI 38.46 kg/m  General:  well developed, well nourished, in no apparent distress Heart: RRR, no LE edema Lungs:  CTAB. No respiratory distress Psych: well oriented with normal range of affect and age-appropriate judgement/insight, alert and oriented x4.  Assessment and Plan  GAD (generalized anxiety disorder)  Primary hypertension  Chronic, uncontrolled. Offered prn med or BuSpar. Would like to see how therapy does.  Chronic, uncontrolled. Cont Norvasc 5 mg/d, increase lisinopril to 40 mg/d. Monitor BP at home.  F/u in 1 mo for CPE. The patient voiced understanding and agreement to the plan.  [place], DO 05/16/22 9:31 AM

## 2022-05-16 NOTE — Patient Instructions (Addendum)
Please see your counselor and let me know if you want a medication addition:  We could add an as needed medication like hydroxyzine or a daily medication like BuSpar.  Check your blood pressures 2-3 times per week, alternating the time of day you check it. If it is high, considering waiting 1-2 minutes and rechecking. If it gets higher, your anxiety is likely creeping up and we should avoid rechecking.   Keep the diet clean and stay active.  Let us know if you need anything.

## 2022-05-19 ENCOUNTER — Ambulatory Visit: Payer: 59 | Admitting: Obstetrics and Gynecology

## 2022-06-07 ENCOUNTER — Encounter: Payer: Self-pay | Admitting: Family Medicine

## 2022-06-07 ENCOUNTER — Other Ambulatory Visit (HOSPITAL_BASED_OUTPATIENT_CLINIC_OR_DEPARTMENT_OTHER): Payer: Self-pay

## 2022-06-07 ENCOUNTER — Ambulatory Visit: Payer: 59 | Admitting: Family Medicine

## 2022-06-07 VITALS — BP 126/85 | HR 78 | Temp 98.5°F | Resp 16 | Wt 219.0 lb

## 2022-06-07 DIAGNOSIS — G8929 Other chronic pain: Secondary | ICD-10-CM | POA: Diagnosis not present

## 2022-06-07 DIAGNOSIS — H4710 Unspecified papilledema: Secondary | ICD-10-CM

## 2022-06-07 DIAGNOSIS — R519 Headache, unspecified: Secondary | ICD-10-CM

## 2022-06-07 MED ORDER — ACETAZOLAMIDE 250 MG PO TABS
250.0000 mg | ORAL_TABLET | Freq: Two times a day (BID) | ORAL | 1 refills | Status: DC
  Filled 2022-06-07: qty 60, 30d supply, fill #0

## 2022-06-07 NOTE — Patient Instructions (Signed)
If you do not hear anything about your referral in the next few days, call our office and ask for an update.  MRI should be done soon.   If things worsen, please go to the ER.   Let us know if you need anything.

## 2022-06-07 NOTE — Progress Notes (Signed)
Chief Complaint  Patient presents with   Blurred Vision    Complains of blurred vision for about 8 days    Subjective: Patient is a 36 y.o. female here for f/u blurry vision.  The patient went to her optometrist for now 8 days of blurry vision.  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  She had a lumbar puncture and did not require any medication.  She had a headache for around 3 months.  She used to see neurology but is not currently.  Of note, she was never on any oral medication.  Past Medical History:  Diagnosis Date   Anemia    Anemia    Anxiety    Clotting disorder ([facility])    Hyperlipemia    Hypertension    Hypertension     Objective: BP 126/85 (BP Location: Left Arm, Patient Position: Sitting, Cuff Size: Large)   Pulse 78   Temp 98.5 F (36.9 C) (Oral)   Resp 16   Wt 219 lb (99.3 kg)   SpO2 100%   BMI 38.79 kg/m  General: Awake, appears stated age Heart: RRR Neuro: 5/5 strength throughout, DTRs equal and symmetric throughout, no clonus, no cerebellar signs, gait is normal Eyes, PERRLA, sclera white, external ocular muscles grossly intact, there appears to be an increased disc to cup ratio in both eyes. MSK: No TTP over the TMJ, temporalis, suboccipital triangle, or cervical paraspinal musculature bilaterally Lungs: CTAB, no rales, wheezes or rhonchi. No accessory muscle use Psych: Age appropriate judgment and insight, normal affect and mood  Assessment and Plan: Papilledema - Plan: acetaZOLAMIDE (DIAMOX) 250 MG tablet, Ambulatory referral to Neurology  Chronic nonintractable headache, unspecified headache type - Plan: MR Brain W Wo Contrast, Ambulatory referral to Neurology  Recurrent/chronic issue with IIH.  Will empirically start acetazolamide 250 mg twice daily, urgently refer to neurology, and placed an ASAP MRI of the brain.  If she has worsening symptoms with vision or headaches, she will seek immediate/emergent care. The patient  voiced understanding and agreement to the plan.  [name] [place], DO 06/07/22  12:34 PM

## 2022-06-14 ENCOUNTER — Ambulatory Visit (HOSPITAL_BASED_OUTPATIENT_CLINIC_OR_DEPARTMENT_OTHER)
Admission: RE | Admit: 2022-06-14 | Discharge: 2022-06-14 | Disposition: A | Discharge status: Home / Self Care | Payer: 59 | Source: Ambulatory Visit | Attending: Family Medicine | Admitting: Family Medicine

## 2022-06-14 DIAGNOSIS — R519 Headache, unspecified: Secondary | ICD-10-CM | POA: Insufficient documentation

## 2022-06-14 DIAGNOSIS — G8929 Other chronic pain: Secondary | ICD-10-CM | POA: Diagnosis present

## 2022-06-14 MED ORDER — GADOBUTROL 1 MMOL/ML IV SOLN
10.0000 mL | Freq: Once | INTRAVENOUS | Status: AC | PRN
  Administered 2022-06-14: 10 mL via INTRAVENOUS

## 2022-06-19 ENCOUNTER — Other Ambulatory Visit (HOSPITAL_BASED_OUTPATIENT_CLINIC_OR_DEPARTMENT_OTHER): Payer: Self-pay

## 2022-06-19 ENCOUNTER — Ambulatory Visit: Payer: 59 | Admitting: Neurology

## 2022-06-19 ENCOUNTER — Encounter: Payer: Self-pay | Admitting: Neurology

## 2022-06-19 VITALS — BP 116/85 | HR 75 | Ht 63.0 in | Wt 217.5 lb

## 2022-06-19 DIAGNOSIS — G43709 Chronic migraine without aura, not intractable, without status migrainosus: Secondary | ICD-10-CM | POA: Diagnosis not present

## 2022-06-19 DIAGNOSIS — G932 Benign intracranial hypertension: Secondary | ICD-10-CM | POA: Diagnosis not present

## 2022-06-19 MED ORDER — RIZATRIPTAN BENZOATE 5 MG PO TABS
5.0000 mg | ORAL_TABLET | ORAL | 11 refills | Status: DC | PRN
  Filled 2022-06-19: qty 9, 30d supply, fill #0

## 2022-06-19 NOTE — Progress Notes (Signed)
Chief Complaint  Patient presents with   New Patient (Initial Visit)    Rm 14. Alone. NP Urgent internal referral for papilledema and chronic headache.      ASSESSMENT AND PLAN  Helen Wells is a 36 y.o. 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, Recurrent daily headache, blurry vision,  Was seen by optometrist in November, noticed optic disc edema, blurry nasal edge on today's funduscopy examination  Referred to ophthalmologist for more comprehensive evaluation  Personally reviewed MRI of the brain without contrast June 15, 2022 no acute abnormality, partial empty sella  Lumbar puncture  Was started on Diamox 250 mg twice a day tolerating it well,  Previously could not tolerate Topamax, different preventive medication for migraine, does not want to try any new medicines at this point  Laboratory evaluations  Headache with migraine features  Maxalt as needed  Heavy menstruation, anemia, low ferritin only 10  Gynecology evaluation pending for heavy menstrual cycle     Return To Clinic With NP In 6 Months   DIAGNOSTIC DATA (LABS, IMAGING, TESTING) - I reviewed patient records, labs, notes, testing and imaging myself where available.  MRI of brain w/wo on Jun 15 2022: 1. No acute intracranial abnormality. 2. 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 Correlation with opening pressure suggested.  Laboratory evaluation September 2023, normal TSH, free T4, CBC showed hemoglobin of 13.3, ferritin 10  MEDICAL HISTORY:  Helen Wells, is a 36 year old female, seen in request by her primary care doctor Shelda Pal for evaluation of recurrent headache, blurry vision, initial evaluation was on June 19, 2022  I reviewed and summarized the referring note. PMHX HTN Pseudotumor Cerebri Clotting disorder.  She has a  history of pseudotumor cerebri at age 57 in 2002, presenting with frequent headache, blurry vision, eventually loss of vision, lasting for couple days, was treated by [facility] pediatric neurologist man, was diagnosed with pseudotumor cerebri, underwent lumbar puncture, treated with Diamox for many years, her vision did come back almost normal, her symptoms has been stable at a later multiple neurology follow-up, she eventually stopped the Diamox around 2007.  She later went to her mission trip to [place], lost follow-up for few years.  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  Personally reviewed MRI of the brain, no significant abnormality, other than partial empty sella  Labs showed low ferritin in the past, normal TSH normal hemoglobin  PHYSICAL EXAM:   Vitals:   06/19/22 0905  BP: 116/85  Pulse: 75  Weight: 217 lb 8 oz (98.7 kg)  Height: 5\' 3"  (1.6 m)     Body mass index is 38.53 kg/m.  PHYSICAL EXAMNIATION:  Gen: NAD, conversant, well nourised, well groomed                     Cardiovascular: Regular rate rhythm, no peripheral edema, warm, nontender. Eyes: Conjunctivae clear without exudates or hemorrhage Neck: Supple, no carotid bruits. Pulmonary: Clear to auscultation bilaterally   NEUROLOGICAL EXAM:  MENTAL STATUS: Speech/cognition: Awake, alert, oriented to history taking and casual conversation CRANIAL NERVES: CN II: Visual fields are full to confrontation. Pupils are round equal and briskly reactive to light.  Funduscopy examination showed blurry edge at nasal disc CN III, IV,  VI: extraocular movement are normal. No ptosis. CN V: Facial sensation is intact to light touch CN VII: Face is symmetric with normal eye closure  CN VIII: Hearing is normal to causal conversation. CN IX, X: Phonation is normal. CN XI: Head turning and shoulder shrug are intact  MOTOR: There is no pronator drift of out-stretched arms. Muscle bulk and tone are normal. Muscle strength is normal.  REFLEXES: Reflexes are 2+ and symmetric at the biceps, triceps, knees, and ankles. Plantar responses are flexor.  SENSORY: Intact to light touch, pinprick and vibratory sensation are intact in fingers and toes.  COORDINATION: There is no trunk or limb dysmetria noted.  GAIT/STANCE: Posture is normal. Gait is steady with normal steps, base, arm swing, and turning. Heel and toe walking are normal. Tandem gait is normal.  Romberg is absent.  REVIEW OF SYSTEMS:  Full 14 system review of systems performed and notable only for as above All other review of systems were negative.   ALLERGIES: Allergies  Allergen Reactions   Bupivacaine-Meloxicam Er     Other reaction(s): Unknown   Erythromycin     Blind Other reaction(s): Other 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 Other reaction(s): Other Informed by her MD not to take any Tetracyclines. Informed by her MD not to take any Tetracyclines. Blind    Erythromycin Base     Other reaction(s): Other (See Comments) Other    Macrolides And Ketolides     Other reaction(s): Other (See Comments), Other (See Comments) Blindness, Pseudotumor Cerebri  Blindness, Pseudotumor Cerebri      HOME MEDICATIONS: Current Outpatient Medications  Medication Sig Dispense Refill   acetaZOLAMIDE (DIAMOX) 250 MG  tablet Take 1 tablet (250 mg total) by mouth 2 (two) times daily. 60 tablet 1   amLODipine (NORVASC) 5 MG tablet Take 1 tablet (5 mg total) by mouth daily. 90 tablet 2   furosemide (LASIX) 40 MG tablet Take 1 tablet (40 mg total) by mouth daily as needed for edema. 30 tablet 3   lisinopril (ZESTRIL) 40 MG tablet Take 1 tablet (40 mg total) by mouth daily. 90 tablet 3   No current facility-administered medications for this visit.    PAST MEDICAL HISTORY: Past Medical History:  Diagnosis Date   Anemia    Anemia    Anxiety    Clotting disorder ([place])    Hyperlipemia    Hypertension    Hypertension     PAST SURGICAL HISTORY: Past Surgical History:  Procedure Laterality Date   Gallbladder Removal 2017     lancinectomy and Discectomy [id number]     Dr Cathren Laine - Neurosurgeon    FAMILY HISTORY: Family History  Problem Relation Age of Onset   Hypertension Mother    Arthritis Mother    High blood pressure Mother    Hypertension Father    Birth defects Father    High Cholesterol Father    Learning disabilities Father    Alzheimer's disease Son  SOCIAL HISTORY: Social History   Socioeconomic History   Marital status: Married    Spouse name: Not on file   Number of children: Not on file   Years of education: Not on file   Highest education level: Not on file  Occupational History   Not on file  Tobacco Use   Smoking status: Never   Smokeless tobacco: Never  Substance and Sexual Activity   Alcohol use: Not Currently    Alcohol/week: 1.0 standard drink of alcohol    Types: 1 Shots of liquor per week   Drug use: Never   Sexual activity: Not on file  Other Topics Concern   Not on file  Social History Narrative   ** Merged History Encounter **       Social Determinants of Health   Financial Resource Strain: Not on file  Food Insecurity: Not on file  Transportation Needs: Not on file  Physical Activity: Not on file  Stress: Not on file  Social Connections: Not on  file  Intimate Partner Violence: Not on file      Levert Feinstein, M.D. Ph.D.  [facility] [address], [address] [place], [place] [address] Ph: [phone] Fax: [phone]  CC:  Sharlene Dory, DO [address] [place],  [place] [address]  Sharlene Dory, DO

## 2022-06-20 ENCOUNTER — Other Ambulatory Visit (HOSPITAL_BASED_OUTPATIENT_CLINIC_OR_DEPARTMENT_OTHER): Payer: Self-pay

## 2022-06-20 ENCOUNTER — Telehealth: Payer: Self-pay | Admitting: Neurology

## 2022-06-20 NOTE — Telephone Encounter (Signed)
Referral sent to [facility], phone # [phone]. 

## 2022-06-21 ENCOUNTER — Ambulatory Visit (INDEPENDENT_AMBULATORY_CARE_PROVIDER_SITE_OTHER): Payer: 59 | Admitting: Family Medicine

## 2022-06-21 ENCOUNTER — Encounter: Payer: Self-pay | Admitting: Family Medicine

## 2022-06-21 VITALS — BP 121/71 | HR 74 | Temp 98.4°F | Ht 63.0 in | Wt 219.2 lb

## 2022-06-21 DIAGNOSIS — Z0000 Encounter for general adult medical examination without abnormal findings: Secondary | ICD-10-CM

## 2022-06-21 LAB — COMPREHENSIVE METABOLIC PANEL
ALT: 16 U/L (ref 0–35)
AST: 13 U/L (ref 0–37)
Albumin: 4.2 g/dL (ref 3.5–5.2)
Alkaline Phosphatase: 74 U/L (ref 39–117)
BUN: 12 mg/dL (ref 6–23)
CO2: 24 mEq/L (ref 19–32)
Calcium: 9.1 mg/dL (ref 8.4–10.5)
Chloride: 107 mEq/L (ref 96–112)
Creatinine, Ser: 1.03 mg/dL (ref 0.40–1.20)
GFR: 69.68 mL/min (ref >60.00)
Glucose, Bld: 96 mg/dL (ref 70–99)
Potassium: 4.1 mEq/L (ref 3.5–5.1)
Sodium: 138 mEq/L (ref 135–145)
Total Bilirubin: 0.3 mg/dL (ref 0.2–1.2)
Total Protein: 6.9 g/dL (ref 6.0–8.3)

## 2022-06-21 LAB — CBC
HCT: 39 % (ref 36.0–46.0)
Hemoglobin: 13 g/dL (ref 12.0–15.0)
MCHC: 33.4 g/dL (ref 30.0–36.0)
MCV: 90.3 fl (ref 78.0–100.0)
Platelets: 319 10*3/uL (ref 150.0–400.0)
RBC: 4.32 Mil/uL (ref 3.87–5.11)
RDW: 16.8 % — ABNORMAL HIGH (ref 11.5–15.5)
WBC: 6.7 10*3/uL (ref 4.0–10.5)

## 2022-06-21 LAB — LIPID PANEL
Cholesterol: 225 mg/dL — ABNORMAL HIGH (ref 0–200)
HDL: 46.7 mg/dL (ref >39.00)
LDL Cholesterol: 147 mg/dL — ABNORMAL HIGH (ref 0–99)
NonHDL: 177.82
Total CHOL/HDL Ratio: 5
Triglycerides: 154 mg/dL — ABNORMAL HIGH (ref 0.0–149.0)
VLDL: 30.8 mg/dL (ref 0.0–40.0)

## 2022-06-21 LAB — RPR, QUANT+TP ABS (REFLEX)
Rapid Plasma Reagin, Quant: 1:1 {titer} — ABNORMAL HIGH
T Pallidum Abs: NONREACTIVE

## 2022-06-21 LAB — IBC + FERRITIN
Ferritin: 12.4 ng/mL (ref 10.0–291.0)
Iron: 68 ug/dL (ref 42–145)
Saturation Ratios: 15.5 % — ABNORMAL LOW (ref 20.0–50.0)
TIBC: 439.6 ug/dL (ref 250.0–450.0)
Transferrin: 314 mg/dL (ref 212.0–360.0)

## 2022-06-21 LAB — FOLATE: Folate: 2.2 ng/mL — ABNORMAL LOW (ref >3.0)

## 2022-06-21 LAB — VITAMIN B12: Vitamin B-12: 336 pg/mL (ref 232–1245)

## 2022-06-21 LAB — ANA W/REFLEX IF POSITIVE: Anti Nuclear Antibody (ANA): NEGATIVE

## 2022-06-21 LAB — C-REACTIVE PROTEIN: CRP: 2 mg/L (ref 0–10)

## 2022-06-21 LAB — RPR: RPR Ser Ql: REACTIVE — AB

## 2022-06-21 LAB — SEDIMENTATION RATE: Sed Rate: 10 mm/hr (ref 0–32)

## 2022-06-21 NOTE — Progress Notes (Signed)
Chief Complaint  Patient presents with   Annual Exam     Well Woman Helen Wells is here for a complete physical.   Her last physical was >1 year ago.  Current diet: in general, . Current exercise: active at work. No LMP recorded. (Menstrual status: Other). Fatigue out of ordinary? No Seatbelt? Yes Advanced directive? No  Health Maintenance Pap/HPV- Yes Tetanus- Yes HIV screening- Yes Hep C screening- Yes  Past Medical History:  Diagnosis Date   Anemia    Anemia    Anxiety    Clotting disorder (HCC)    Hyperlipemia    Hypertension    Hypertension      Past Surgical History:  Procedure Laterality Date   Gallbladder Removal 2017     lancinectomy and Discectomy 2021     Dr Orlie Pollen - Neurosurgeon    Medications  Current Outpatient Medications on File Prior to Visit  Medication Sig Dispense Refill   acetaZOLAMIDE (DIAMOX) 250 MG tablet Take 1 tablet (250 mg total) by mouth 2 (two) times daily. 60 tablet 1   amLODipine (NORVASC) 5 MG tablet Take 1 tablet (5 mg total) by mouth daily. 90 tablet 2   furosemide (LASIX) 40 MG tablet Take 1 tablet (40 mg total) by mouth daily as needed for edema. 30 tablet 3   lisinopril (ZESTRIL) 40 MG tablet Take 1 tablet (40 mg total) by mouth daily. 90 tablet 3   rizatriptan (MAXALT) 5 MG tablet Take 1 tablet (5 mg total) by mouth as needed for migraine. 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 Other reaction(s): Other Informed by her MD not to take any Tetracyclines. Informed by her MD not  to take any Tetracyclines. Blind    Erythromycin Base     Other reaction(s): Other (See Comments) Other    Macrolides And Ketolides     Other reaction(s): Other (See Comments), Other (See Comments) Blindness, Pseudotumor Cerebri  Blindness, Pseudotumor Cerebri      Review of Systems: Constitutional:  no unexpected weight changes Eye:  no recent significant change in vision Ear/Nose/Mouth/Throat:  Ears:  no tinnitus or vertigo and no recent change in hearing Nose/Mouth/Throat:  no complaints of nasal congestion, no sore throat Cardiovascular: no chest pain Respiratory:  no cough and no shortness of breath Gastrointestinal:  no abdominal pain, no change in bowel habits GU:  Female: negative for dysuria or pelvic pain Musculoskeletal/Extremities:  no pain of the joints Integumentary (Skin/Breast):  no abnormal skin lesions reported Neurologic:  + headaches Endocrine:  denies fatigue Hematologic/Lymphatic:  No areas of easy bleeding  Exam BP 121/71 (BP Location: Left Arm, Patient Position: Sitting, Cuff Size: Normal)   Pulse 74   Temp 98.4 F (36.9 C) (Oral)   Ht 5\' 3"  (1.6 m)   Wt 219 lb 4 oz (99.5 kg)   SpO2 99%   BMI 38.84 kg/m  General:  well developed, well nourished, in no apparent distress Skin:  no significant moles, warts, or growths Head:  no masses, lesions, or tenderness Eyes:  pupils equal and round, sclera anicteric without injection Ears:  canals without lesions, TMs shiny without retraction, no obvious effusion, no erythema Nose:  nares patent, mucosa normal, and no drainage  Throat/Pharynx:  lips and gingiva without lesion; tongue and uvula midline; non-inflamed pharynx; no exudates or postnasal drainage Neck: neck supple without adenopathy, thyromegaly, or masses Lungs:  clear to auscultation, breath sounds equal bilaterally, no respiratory distress Cardio:  regular rate and rhythm, no bruits, no LE edema Abdomen:  abdomen soft, nontender; bowel sounds  normal; no masses or organomegaly Genital: Defer to GYN Musculoskeletal:  symmetrical muscle groups noted without atrophy or deformity Extremities:  no clubbing, cyanosis, or edema, no deformities, no skin discoloration Neuro:  gait normal; deep tendon reflexes normal and symmetric Psych: well oriented with normal range of affect and appropriate judgment/insight  Assessment and Plan  Well adult exam - Plan: CBC, Comprehensive metabolic panel, Lipid panel, IBC + Ferritin   Well 36 y.o. female. Counseled on diet and exercise. Advanced directive form provided today.  Other orders as above. Follow up in 6 mo. The patient voiced understanding and agreement to the plan.  [name] [place], DO 06/21/22 9:50 AM

## 2022-06-23 ENCOUNTER — Encounter: Payer: Self-pay | Admitting: Obstetrics and Gynecology

## 2022-06-23 ENCOUNTER — Ambulatory Visit: Payer: 59 | Admitting: Obstetrics and Gynecology

## 2022-06-23 VITALS — BP 105/78 | HR 67 | Wt 218.0 lb

## 2022-06-23 DIAGNOSIS — Z975 Presence of (intrauterine) contraceptive device: Secondary | ICD-10-CM | POA: Diagnosis not present

## 2022-06-23 DIAGNOSIS — N921 Excessive and frequent menstruation with irregular cycle: Secondary | ICD-10-CM

## 2022-06-23 NOTE — Progress Notes (Signed)
GYNECOLOGY VISIT  Patient name: NYIESHA BEEVER MRN [id number]  Date of birth: 06/04/1986 Chief Complaint:   Menstrual Problem  History:  Helen Wells is a 36 y.o. G2P2000 being seen today for AUB management discussion. Has an Cu-IUD in place with prolonged cycles. Would like to know about management options and would like reliable contraception at the same time.  States difficulties with BP management on lng-IUD and pills. Does not want less reliable contraception and is concerned about cost of procedures. 14d menses x 11 years.   Past Medical History:  Diagnosis Date   Anemia    Anemia    Anxiety    Clotting disorder (HCC)    Hyperlipemia    Hypertension    Hypertension     Past Surgical History:  Procedure Laterality Date   Gallbladder Removal 2017     lancinectomy and Discectomy 2021     Dr Orlie Pollen - Neurosurgeon    The following portions of the patient's history were reviewed and updated as appropriate: allergies, current medications, past family history, past medical history, past social history, past surgical history and problem list.     Review of Systems:  Pertinent items are noted in HPI. Comprehensive review of systems was otherwise negative.   Objective:  Physical Exam BP 105/78   Pulse 67   Wt 218 lb (98.9 kg)   LMP 05/26/2022 (Exact Date)   BMI 38.62 kg/m    Physical Exam Vitals and nursing note reviewed.  Constitutional:      Appearance: Normal appearance.  HENT:     Head: Normocephalic and atraumatic.  Pulmonary:     Effort: Pulmonary effort is normal.  Skin:    General: Skin is warm and dry.  Neurological:     General: No focal deficit present.     Mental Status: She is alert.  Psychiatric:        Mood and Affect: Mood normal.        Behavior: Behavior normal.        Thought Content: Thought content normal.        Judgment: Judgment normal.     Labs and Imaging Narrative & Impression  CLINICAL DATA:  Initial  evaluation for prolonged menstrual cycles, IUD in place.   EXAM: TRANSABDOMINAL AND TRANSVAGINAL ULTRASOUND OF PELVIS   TECHNIQUE: Both transabdominal and transvaginal ultrasound examinations of the pelvis were performed. Transabdominal technique was performed for global imaging of the pelvis including uterus, ovaries, adnexal regions, and pelvic cul-de-sac. It was necessary to proceed with endovaginal exam following the transabdominal exam to visualize the endometrium.   COMPARISON:  None Available.   FINDINGS: Uterus   Measurements: 9.8 x 4.3 x 5.5 cm = volume: 121.2 mL. Uterus is anteverted. No discrete fibroid or other myometrial abnormality.   Endometrium   Thickness: 11.1 mm. No focal abnormality visualized. IUD in place within the endometrial cavity at the level of the lower uterine segment.   Right ovary   Measurements: 3.3 x 2.5 x 3.2 cm = volume: 14.0 mL. Normal appearance/no adnexal mass.   Left ovary   Measurements: 2.2 x 1.5 x 1.1 cm = volume: 1.9 mL. Normal appearance/no adnexal mass.   Other findings   No abnormal free fluid.   IMPRESSION: 1. Endometrial stripe measures 11.1 mm in thickness. If bleeding remains unresponsive to hormonal or medical therapy, sonohysterogram should be considered for focal lesion work-up. (Ref: Radiological Reasoning: Algorithmic Workup of Abnormal Vaginal Bleeding with Endovaginal Sonography and Sonohysterography.  AJR 2008; 308:M57-84). 2. IUD in place at the level of the lower uterine segment. 3. Otherwise unremarkable and normal pelvic ultrasound.       Assessment & Plan:   1. Prolonged menstrual cycles Discussed management options for abnormal uterine bleeding including NSAIDs (Naproxen), tranexamic acid (Lysteda), oral progesterone, Depo Provera, Levonogestrel IUD, endometrial ablation or hysterectomy as definitive surgical management.  Discussed risks and benefits of each method.     Not able to use CHCs due  to HTN. Discussed intention of ablation to lighten cycles and may stop altogether with risk of ablation failure or return of bleeding after a few years. Interested in hysterectomy but concerned about cost - sure she has completed childbearing. She would consider tubal and ablation, but again concern is cost.   She will contact insurance regarding cost of procedures and alert clinic of decision. Will need EMB prior to ablation or hysterectomy.   2. Paragard IUD (intrauterine device) in place since 2021 Discussed likely worsening bleeding with cu-IUD in place.    Routine preventative health maintenance measures emphasized.  Lorriane Shire, MD [facility], [facility]

## 2022-07-04 ENCOUNTER — Telehealth: Payer: Self-pay | Admitting: Neurology

## 2022-07-04 ENCOUNTER — Encounter: Payer: Self-pay | Admitting: Radiology

## 2022-07-04 ENCOUNTER — Encounter: Payer: Self-pay | Admitting: Neurology

## 2022-07-04 ENCOUNTER — Ambulatory Visit
Admission: RE | Admit: 2022-07-04 | Discharge: 2022-07-04 | Disposition: A | Discharge status: Home / Self Care | Payer: 59 | Source: Ambulatory Visit | Attending: Neurology | Admitting: Neurology

## 2022-07-04 DIAGNOSIS — G43709 Chronic migraine without aura, not intractable, without status migrainosus: Secondary | ICD-10-CM

## 2022-07-04 DIAGNOSIS — G932 Benign intracranial hypertension: Secondary | ICD-10-CM

## 2022-07-04 NOTE — Telephone Encounter (Signed)
Sent a [facility] message to the pt providing her results

## 2022-07-04 NOTE — Telephone Encounter (Signed)
Please call patient, lumbar puncture today showed opening pressure of 28 cm water, mildly elevated, normal should be less than or equal to 20 cmH2O, spinal fluid testing showed no significant abnormalities

## 2022-07-04 NOTE — Discharge Instructions (Signed)

## 2022-07-06 ENCOUNTER — Other Ambulatory Visit (HOSPITAL_BASED_OUTPATIENT_CLINIC_OR_DEPARTMENT_OTHER): Payer: Self-pay

## 2022-07-06 ENCOUNTER — Encounter: Payer: Self-pay | Admitting: Neurology

## 2022-07-06 ENCOUNTER — Telehealth: Payer: Self-pay | Admitting: Neurology

## 2022-07-06 DIAGNOSIS — G932 Benign intracranial hypertension: Secondary | ICD-10-CM

## 2022-07-06 DIAGNOSIS — G971 Other reaction to spinal and lumbar puncture: Secondary | ICD-10-CM | POA: Insufficient documentation

## 2022-07-06 MED ORDER — BUTALBITAL-APAP-CAFFEINE 50-325-40 MG PO TABS
1.0000 | ORAL_TABLET | Freq: Four times a day (QID) | ORAL | 1 refills | Status: DC | PRN
  Filled 2022-07-06: qty 30, 8d supply, fill #0

## 2022-07-06 NOTE — Telephone Encounter (Signed)
I sent a message to [place] at [facility] for the blood patch.

## 2022-07-06 NOTE — Telephone Encounter (Signed)
Lumbar puncture July 04, 2022, blood pressure 28 cm  She is at high risk for post LP position related headache, please check with her to see if her headache getting worse after bending or sitting up, improved after lying flat  She should also try to lie flat, resting  Advised her to drink plenty water, caffeine  Meds ordered this encounter  Medications   butalbital-acetaminophen-caffeine (FIORICET) 50-325-40 MG tablet    Sig: Take 1 tablet by mouth every 6 (six) hours as needed for headache.    Dispense:  30 tablet    Refill:  1     Call clinic for worsening headache,

## 2022-07-06 NOTE — Telephone Encounter (Signed)
I have called patient, she was doing well the day of lumbar puncture December 19, opening pressure 28 cm water  Next day December 20 in the afternoon she began to develop positional related headache, as soon as she sitting up or standing, she would develop behind the eye, neck area pain, improved after lying flat  She has been lying flat, drink a lot of caffeine drink without helping her symptoms  Urgent referral for blood patch,  Orders Placed This Encounter  Procedures   Ambulatory referral to Interventional Radiology   Fioricet as needed

## 2022-07-06 NOTE — Telephone Encounter (Addendum)
Contacted pt back, she stated she was told she would have a blood patch done (by GI) if she is continuing to have a headache after 3 days. She stated "so nothing is going to be done, I have had complications after LP before and was told this time a blood patch would be done". I went over your recommendations but she was more interested in blood patch and felt nothing was actually being done to help relieve headache. Her headaches does improve while she is lying flat but she can't do that all the time. Are you willing to order blood patch ?

## 2022-07-06 NOTE — Telephone Encounter (Signed)
Pt called stating that Tuesday she had a spinal tap and now she is suffering from a sever migraine that includes light sensitivity and hearing loss. Pt would like to receive a call back.

## 2022-07-06 NOTE — Telephone Encounter (Signed)
Would you like to prescribe something for headache post LP? She is taking Maxalt as needed

## 2022-07-06 NOTE — Addendum Note (Signed)
Addended by: Levert Feinstein on: 07/06/2022 03:47 PM   Modules accepted: Orders

## 2022-07-06 NOTE — Telephone Encounter (Signed)
Noted  

## 2022-07-07 ENCOUNTER — Other Ambulatory Visit: Payer: Self-pay | Admitting: Neurology

## 2022-07-07 ENCOUNTER — Ambulatory Visit
Admission: RE | Admit: 2022-07-07 | Discharge: 2022-07-07 | Disposition: A | Discharge status: Home / Self Care | Payer: 59 | Source: Ambulatory Visit | Attending: Neurology | Admitting: Neurology

## 2022-07-07 DIAGNOSIS — R519 Headache, unspecified: Secondary | ICD-10-CM

## 2022-07-07 MED ORDER — IOPAMIDOL (ISOVUE-M 200) INJECTION 41%
1.0000 mL | Freq: Once | INTRAMUSCULAR | Status: AC
  Administered 2022-07-07: 1 mL via EPIDURAL

## 2022-07-07 NOTE — Discharge Instr - Other Info (Signed)

## 2022-07-07 NOTE — Discharge Instructions (Signed)

## 2022-07-07 NOTE — Progress Notes (Signed)
20cc blood collected from pts LAC by A.Vernon RN prior to blood patch procedure. Pt tolerated well. 1 successful attempt. Gauze and tape applied after.

## 2022-07-20 ENCOUNTER — Other Ambulatory Visit (HOSPITAL_BASED_OUTPATIENT_CLINIC_OR_DEPARTMENT_OTHER): Payer: Self-pay

## 2022-07-26 ENCOUNTER — Encounter: Payer: Self-pay | Admitting: Family Medicine

## 2022-08-02 LAB — PROTEIN, CSF: Total Protein, CSF: 27 mg/dL (ref 15–45)

## 2022-08-02 LAB — FUNGUS CULTURE W SMEAR
CULTURE:: NO GROWTH
MICRO NUMBER:: 14334223
SMEAR:: NONE SEEN
SPECIMEN QUALITY:: ADEQUATE

## 2022-08-02 LAB — VDRL, CSF: VDRL Quant, CSF: NONREACTIVE

## 2022-08-02 LAB — GRAM STAIN
GRAM STAIN:: NONE SEEN
MICRO NUMBER:: 14334222
SPECIMEN QUALITY:: ADEQUATE

## 2022-08-02 LAB — CSF CELL COUNT WITH DIFFERENTIAL
RBC Count, CSF: 0 cells/uL
TOTAL NUCLEATED CELL: 2 cells/uL (ref 0–5)

## 2022-08-02 LAB — GLUCOSE, CSF: Glucose, CSF: 63 mg/dL (ref 40–80)

## 2022-09-11 ENCOUNTER — Ambulatory Visit: Payer: Commercial Managed Care - PPO | Admitting: Family Medicine

## 2022-09-11 ENCOUNTER — Encounter: Payer: Self-pay | Admitting: Family Medicine

## 2022-09-11 ENCOUNTER — Other Ambulatory Visit (HOSPITAL_BASED_OUTPATIENT_CLINIC_OR_DEPARTMENT_OTHER): Payer: Self-pay

## 2022-09-11 VITALS — BP 108/68 | HR 90 | Temp 98.0°F | Ht 63.0 in | Wt 226.1 lb

## 2022-09-11 DIAGNOSIS — M545 Low back pain, unspecified: Secondary | ICD-10-CM | POA: Diagnosis not present

## 2022-09-11 MED ORDER — PREDNISONE 20 MG PO TABS
40.0000 mg | ORAL_TABLET | Freq: Every day | ORAL | 0 refills | Status: AC
  Filled 2022-09-11: qty 10, 5d supply, fill #0

## 2022-09-11 NOTE — Progress Notes (Signed)
Musculoskeletal Exam  Patient: Helen Wells DOB: [birth year]  DOS: 09/11/2022  SUBJECTIVE:  Chief Complaint:   Chief Complaint  Patient presents with   Back Pain    Helen Wells is a 37 y.o.  female for evaluation and treatment of back pain.   Onset:  1 week ago.  Was raking and shoveling mulch.   Location: lower L Character:  dull and sharp  Progression of issue:  has worsened Associated symptoms: limited ROM No bruising, redness, swelling Denies bowel/bladder incontinence or weakness Treatment: to date has been ice, stretches.   Neurovascular symptoms: no  Past Medical History:  Diagnosis Date   Anemia    Anemia    Anxiety    Clotting disorder (HCC)    Hyperlipemia    Hypertension    Hypertension     Objective:  VITAL SIGNS: BP 108/68 (BP Location: Left Arm, Patient Position: Sitting, Cuff Size: Large)   Pulse 90   Temp 98 F (36.7 C) (Oral)   Ht '5\' 3"'$  (1.6 m)   Wt 226 lb 2 oz (102.6 kg)   SpO2 98%   BMI 40.06 kg/m  Constitutional: Well formed, well developed. No acute distress. HENT: Normocephalic, atraumatic.  Thorax & Lungs:  No accessory muscle use Musculoskeletal: low back.   Tenderness to palpation: no Deformity: no Ecchymosis: no Straight leg test: negative for Poor hamstring flexibility on L. Neurologic: Normal sensory function. No focal deficits noted. DTR's equal and symmetric in LE's. No clonus. Psychiatric: Normal mood. Age appropriate judgment and insight. Alert & oriented x 3.    Assessment:  Acute left-sided low back pain without sciatica - Plan: predniSONE (DELTASONE) 20 MG tablet  Plan: Stretches/exercises, heat, ice, Tylenol, 5 d pred burst 40 mg/d. PT if no better in the next few weeks.  F/u prn. The patient voiced understanding and agreement to the plan.   [place], DO 09/11/22  3:38 PM

## 2022-09-11 NOTE — Patient Instructions (Addendum)
Heat (pad or rice pillow in microwave) over affected area, 10-15 minutes twice daily.   Ice/cold pack over area for 10-15 min twice daily.  OK to take Tylenol 1000 mg (2 extra strength tabs) or 975 mg (3 regular strength tabs) every 6 hours as needed.  Send me a message if we aren't improving in 2-3 weeks, send me a message.   Do your lumbar stretches please.   Let us know if you need anything.

## 2022-09-14 NOTE — Progress Notes (Signed)
No chief complaint on file.     ASSESSMENT AND PLAN  Helen Wells is a 37 y.o. 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 She is resistant to trial zonisamide.  Discussed importance of daily medication compliance and to not stop any medications on her own.  Discussed importance of weight loss, declines interest in referral to healthy weight and wellness at this time but will call if interested in pursuing in the future   Follow-up in 4 months (after ophthalmology visit) or call earlier if needed    DIAGNOSTIC DATA (LABS, IMAGING, TESTING) - I reviewed patient records, labs, notes, testing and imaging myself where available.  Lumbar puncture 07/04/2022 IMPRESSION: Elevated CSF opening pressure of 28 cm of water. After collection of 20 cc of spinal fluid, closing pressure was 11 cm water.  MRI of brain w/wo on Jun 15 2022: 1. No acute intracranial abnormality. 2. 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 Correlation with opening pressure suggested.  Laboratory evaluation September 2023, normal TSH, free T4, CBC showed hemoglobin of 13.3, ferritin 10  MEDICAL HISTORY:  Update 09/18/2022 JM: Returns for follow-up visit.  Underwent LP in December which showed opening pressure of 28, she was advised to continue on Diamox 250 mg twice daily.  She did end up with a spinal headache after the procedure requiring blood patch.  Was prescribed Fioricet as needed.    Headaches did improve after spinal tap currently having about 4 headaches per week, was previously experiencing daily.  Continued persistent blurred vision, OS>OD, can get worse with eyestrain.  Continued light sensitivity.  She self discontinued Diamox last month as she was not aware this needed to be continued and also experienced side effects of tongue/mouth numbness and altered taste, this improved since stopping.  Denies any benefit with use of Fioricet.  She denies any worsening of headache or vision since stopping. Was seen by ophthalmologist shortly after prior visit who noted papilledema (unable to view via epic), has f/u visit 6/13.  Discussed other treatment options for IIH, she greatly wishes to avoid antiseizure medications as she has trialed multiple of these medications previously for migraine prevention and had side effects.  She is currently using furosemide 40 mg as needed for edema, typically uses about once every 2 weeks, tolerates well.      Consult visit 06/19/2022 Dr. Krista Wells: Helen Wells, is a 37 year old female, seen in request by her primary care doctor Helen Wells for evaluation of recurrent headache, blurry vision, initial evaluation was on June 19, 2022  I reviewed and summarized the referring note. PMHX HTN Pseudotumor Cerebri Clotting disorder.  She has a history of pseudotumor cerebri at age 40 in 2002, presenting with frequent headache, blurry vision, eventually loss of vision, lasting for couple days, was treated by [facility] pediatric neurologist man, was diagnosed with pseudotumor cerebri, underwent lumbar puncture, treated with Diamox for many years, her vision did come back almost normal, her symptoms has been stable at a later multiple neurology follow-up, she eventually  stopped the Diamox around 2007.  She later went to her mission trip to [place], lost follow-up for few years.  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  Personally reviewed MRI of the brain, no significant abnormality, other than partial empty sella  Labs showed low ferritin in the past, normal TSH normal hemoglobin  PHYSICAL EXAM:   There were no vitals filed for this visit.    There is no height or weight on file to calculate BMI.  PHYSICAL EXAMNIATION:  Gen: NAD, conversant, well nourised, well groomed                     Cardiovascular: Regular rate rhythm, no peripheral edema, warm, nontender. Eyes: Conjunctivae clear without exudates or hemorrhage Neck: Supple, no carotid bruits. Pulmonary: Clear to auscultation bilaterally   NEUROLOGICAL EXAM:  MENTAL STATUS: Speech/cognition: Awake, alert, oriented to history taking and casual conversation CRANIAL NERVES: CN II: Visual fields are full to confrontation. Pupils are round equal and briskly reactive to light.  Funduscopy examination showed blurry edge at nasal disc CN III, IV, VI: extraocular movement are normal. No ptosis. CN V: Facial sensation is intact to light touch CN VII: Face is symmetric with normal eye closure  CN VIII: Hearing is normal to causal conversation. CN IX, X: Phonation is normal. CN XI: Head turning and shoulder shrug are intact  MOTOR: There is no pronator drift of out-stretched arms. Muscle bulk and tone are normal. Muscle strength is normal.  REFLEXES: Reflexes are 2+ and symmetric at the biceps, triceps, knees, and ankles. Plantar responses are flexor.  SENSORY: Intact to light touch, pinprick and vibratory sensation are intact in fingers and  toes.  COORDINATION: There is no trunk or limb dysmetria noted.  GAIT/STANCE: Posture is normal. Gait is steady with normal steps, base, arm swing, and turning. Heel and toe walking are normal. Tandem gait is normal.  Romberg is absent.  REVIEW OF SYSTEMS:  Full 14 system review of systems performed and notable only for as above All other review of systems were negative.   ALLERGIES: Allergies  Allergen Reactions   Bupivacaine-Meloxicam Er     Other reaction(s): Unknown   Erythromycin     Blind Other reaction(s): Other 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 Other reaction(s): Other Informed by her MD not to take any Tetracyclines. Informed by her MD not to take any Tetracyclines. Blind    Erythromycin Base     Other reaction(s): Other (See Comments) Other    Macrolides And Ketolides     Other reaction(s): Other (See Comments), Other (See Comments) Blindness, Pseudotumor Cerebri  Blindness, Pseudotumor Cerebri      HOME MEDICATIONS: Current Outpatient Medications  Medication Sig Dispense Refill   acetaZOLAMIDE (DIAMOX) 250 MG tablet Take 1 tablet (250 mg total) by mouth 2 (two) times daily. 60 tablet  1   amLODipine (NORVASC) 5 MG tablet Take 1 tablet (5 mg total) by mouth daily. 90 tablet 2   butalbital-acetaminophen-caffeine (FIORICET) 50-325-40 MG tablet Take 1 tablet by mouth every 6 (six) hours as needed for headache. 30 tablet 1   Ferrous Sulfate (IRON) 325 (65 Fe) MG TABS Take by mouth.     folic acid (FOLVITE) 1 MG tablet Take 1 mg by mouth daily.     furosemide (LASIX) 40 MG tablet Take 1 tablet (40 mg total) by mouth daily as needed for edema. 30 tablet 3   lisinopril (ZESTRIL) 40 MG tablet Take 1 tablet (40 mg total) by  mouth daily. 90 tablet 3   predniSONE (DELTASONE) 20 MG tablet Take 2 tablets (40 mg total) by mouth daily with breakfast for 5 days. 10 tablet 0   rizatriptan (MAXALT) 5 MG tablet Take 1 tablet (5 mg total) by mouth as needed for migraine. May repeat in 2 hours if needed 12 tablet 11   No current facility-administered medications for this visit.    PAST MEDICAL HISTORY: Past Medical History:  Diagnosis Date   Anemia    Anemia    Anxiety    Clotting disorder ([place])    Hyperlipemia    Hypertension    Hypertension     PAST SURGICAL HISTORY: Past Surgical History:  Procedure Laterality Date   Gallbladder Removal 2017     lancinectomy and Discectomy 123XX123     Dr Cathren Laine - Neurosurgeon    FAMILY HISTORY: Family History  Problem Relation Age of Onset   Hypertension Mother    Arthritis Mother    High blood pressure Mother    Hypertension Father    Birth defects Father    High Cholesterol Father    Learning disabilities Father    Alzheimer's disease Son     SOCIAL HISTORY: Social History   Socioeconomic History   Marital status: Married    Spouse name: Not on file   Number of children: Not on file   Years of education: Not on file   Highest education level: Not on file  Occupational History   Not on file  Tobacco Use   Smoking status: Never   Smokeless tobacco: Never  Substance and Sexual Activity   Alcohol use: Not Currently    Alcohol/week: 1.0 standard drink of alcohol    Types: 1 Shots of liquor per week   Drug use: Never   Sexual activity: Not on file  Other Topics Concern   Not on file  Social History Narrative   ** Merged History Encounter **       Social Determinants of Health   Financial Resource Strain: Not on file  Food Insecurity: Not on file  Transportation Needs: Not on file  Physical Activity: Not on file  Stress: Not on file  Social Connections: Not on file  Intimate Partner Violence: Not on file      I spent 26 minutes of  face-to-face and non-face-to-face time with patient.  This included previsit chart review, lab review, study review, order entry, electronic health record documentation, patient education and discussion regarding above diagnoses and treatment plan and answered all other questions to patient's satisfaction  Frann Rider, [facility]  [facility] [address] [place],  [address]  Phone [phone] Fax [phone] Note: This document was prepared with digital dictation and possible smart phrase technology. Any transcriptional errors that result from this process are unintentional.

## 2022-09-18 ENCOUNTER — Ambulatory Visit: Payer: Commercial Managed Care - PPO | Admitting: Adult Health

## 2022-09-18 ENCOUNTER — Other Ambulatory Visit (HOSPITAL_BASED_OUTPATIENT_CLINIC_OR_DEPARTMENT_OTHER): Payer: Self-pay

## 2022-09-18 ENCOUNTER — Other Ambulatory Visit: Payer: Self-pay | Admitting: Family Medicine

## 2022-09-18 ENCOUNTER — Encounter: Payer: Self-pay | Admitting: Adult Health

## 2022-09-18 VITALS — BP 121/80 | HR 71 | Ht 63.0 in | Wt 225.0 lb

## 2022-09-18 DIAGNOSIS — G932 Benign intracranial hypertension: Secondary | ICD-10-CM | POA: Diagnosis not present

## 2022-09-18 DIAGNOSIS — Z79899 Other long term (current) drug therapy: Secondary | ICD-10-CM | POA: Diagnosis not present

## 2022-09-18 MED ORDER — FUROSEMIDE 20 MG PO TABS
20.0000 mg | ORAL_TABLET | Freq: Every day | ORAL | 5 refills | Status: DC
  Filled 2022-09-18: qty 30, 30d supply, fill #0
  Filled 2022-10-13: qty 30, 30d supply, fill #1
  Filled 2022-11-13: qty 30, 30d supply, fill #2
  Filled 2022-12-12: qty 30, 30d supply, fill #3

## 2022-09-18 MED ORDER — AMLODIPINE BESYLATE 5 MG PO TABS
5.0000 mg | ORAL_TABLET | Freq: Every day | ORAL | 2 refills | Status: DC
  Filled 2022-09-18: qty 90, 90d supply, fill #0
  Filled 2022-12-12: qty 90, 90d supply, fill #1

## 2022-09-18 NOTE — Patient Instructions (Addendum)
Your Plan:  Recommend starting furosemide '20mg'$  daily   We will check lab work today and will request that you return in 2 weeks for repeat levels - this will help ensure this medication is not removing too much fluid from your body  Follow up with ophthalmology in June as scheduled    Follow up with Dr. Krista Blue in 4 months or call earlier if needed         Thank you for coming to see [place] at [facility]. I hope we have been able to provide you high quality care today.  You may receive a patient satisfaction survey over the next few weeks. We would appreciate your feedback and comments so that we may continue to improve ourselves and the health of our patients.

## 2022-09-19 LAB — BASIC METABOLIC PANEL
BUN/Creatinine Ratio: 9 (ref 9–23)
BUN: 9 mg/dL (ref 6–20)
CO2: 22 mmol/L (ref 20–29)
Calcium: 9.5 mg/dL (ref 8.7–10.2)
Chloride: 107 mmol/L — ABNORMAL HIGH (ref 96–106)
Creatinine, Ser: 0.98 mg/dL (ref 0.57–1.00)
Glucose: 101 mg/dL — ABNORMAL HIGH (ref 70–99)
Potassium: 4.6 mmol/L (ref 3.5–5.2)
Sodium: 142 mmol/L (ref 134–144)
eGFR: 76 mL/min/{1.73_m2} (ref >59)

## 2022-09-20 ENCOUNTER — Other Ambulatory Visit (HOSPITAL_BASED_OUTPATIENT_CLINIC_OR_DEPARTMENT_OTHER): Payer: Self-pay

## 2022-09-22 ENCOUNTER — Encounter (HOSPITAL_BASED_OUTPATIENT_CLINIC_OR_DEPARTMENT_OTHER): Payer: Self-pay | Admitting: Emergency Medicine

## 2022-09-22 ENCOUNTER — Other Ambulatory Visit: Payer: Self-pay

## 2022-09-22 ENCOUNTER — Emergency Department (HOSPITAL_BASED_OUTPATIENT_CLINIC_OR_DEPARTMENT_OTHER): Payer: Commercial Managed Care - PPO

## 2022-09-22 ENCOUNTER — Ambulatory Visit
Admission: EM | Admit: 2022-09-22 | Discharge: 2022-09-22 | Disposition: A | Discharge status: Home / Self Care | Payer: Commercial Managed Care - PPO

## 2022-09-22 ENCOUNTER — Emergency Department (HOSPITAL_BASED_OUTPATIENT_CLINIC_OR_DEPARTMENT_OTHER)
Admission: EM | Admit: 2022-09-22 | Discharge: 2022-09-22 | Disposition: A | Discharge status: Home / Self Care | Payer: Commercial Managed Care - PPO | Attending: Emergency Medicine | Admitting: Emergency Medicine

## 2022-09-22 DIAGNOSIS — Y92410 Unspecified street and highway as the place of occurrence of the external cause: Secondary | ICD-10-CM | POA: Insufficient documentation

## 2022-09-22 DIAGNOSIS — S0990XA Unspecified injury of head, initial encounter: Secondary | ICD-10-CM | POA: Diagnosis not present

## 2022-09-22 DIAGNOSIS — M5442 Lumbago with sciatica, left side: Secondary | ICD-10-CM | POA: Diagnosis not present

## 2022-09-22 DIAGNOSIS — S39012A Strain of muscle, fascia and tendon of lower back, initial encounter: Secondary | ICD-10-CM | POA: Insufficient documentation

## 2022-09-22 DIAGNOSIS — I10 Essential (primary) hypertension: Secondary | ICD-10-CM | POA: Insufficient documentation

## 2022-09-22 DIAGNOSIS — Z041 Encounter for examination and observation following transport accident: Secondary | ICD-10-CM | POA: Diagnosis not present

## 2022-09-22 DIAGNOSIS — M545 Low back pain, unspecified: Secondary | ICD-10-CM | POA: Diagnosis not present

## 2022-09-22 DIAGNOSIS — Z79899 Other long term (current) drug therapy: Secondary | ICD-10-CM | POA: Diagnosis not present

## 2022-09-22 DIAGNOSIS — S299XXA Unspecified injury of thorax, initial encounter: Secondary | ICD-10-CM | POA: Diagnosis not present

## 2022-09-22 LAB — CBC WITH DIFFERENTIAL/PLATELET
Abs Immature Granulocytes: 0.01 10*3/uL (ref 0.00–0.07)
Basophils Absolute: 0 10*3/uL (ref 0.0–0.1)
Basophils Relative: 0 %
Eosinophils Absolute: 0.1 10*3/uL (ref 0.0–0.5)
Eosinophils Relative: 1 %
HCT: 37.8 % (ref 36.0–46.0)
Hemoglobin: 12.7 g/dL (ref 12.0–15.0)
Immature Granulocytes: 0 %
Lymphocytes Relative: 38 %
Lymphs Abs: 3.3 10*3/uL (ref 0.7–4.0)
MCH: 30.5 pg (ref 26.0–34.0)
MCHC: 33.6 g/dL (ref 30.0–36.0)
MCV: 90.9 fL (ref 80.0–100.0)
Monocytes Absolute: 0.7 10*3/uL (ref 0.1–1.0)
Monocytes Relative: 8 %
Neutro Abs: 4.7 10*3/uL (ref 1.7–7.7)
Neutrophils Relative %: 53 %
Platelets: 340 10*3/uL (ref 150–400)
RBC: 4.16 MIL/uL (ref 3.87–5.11)
RDW: 14.8 % (ref 11.5–15.5)
WBC: 8.8 10*3/uL (ref 4.0–10.5)
nRBC: 0 % (ref 0.0–0.2)

## 2022-09-22 LAB — BASIC METABOLIC PANEL
Anion gap: 8 (ref 5–15)
BUN: 11 mg/dL (ref 6–20)
CO2: 28 mmol/L (ref 22–32)
Calcium: 9.7 mg/dL (ref 8.9–10.3)
Chloride: 102 mmol/L (ref 98–111)
Creatinine, Ser: 1.03 mg/dL — ABNORMAL HIGH (ref 0.44–1.00)
GFR, Estimated: >60 mL/min (ref >60)
Glucose, Bld: 84 mg/dL (ref 70–99)
Potassium: 3.7 mmol/L (ref 3.5–5.1)
Sodium: 138 mmol/L (ref 135–145)

## 2022-09-22 LAB — HCG, SERUM, QUALITATIVE: Preg, Serum: NEGATIVE

## 2022-09-22 MED ORDER — ONDANSETRON HCL 4 MG/2ML IJ SOLN: 4.0000 mg | Freq: Once | INTRAMUSCULAR | Status: DC

## 2022-09-22 MED ORDER — HYDROMORPHONE HCL 1 MG/ML IJ SOLN: 1.0000 mg | Freq: Once | INTRAMUSCULAR | Status: DC
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
## 2022-09-22 NOTE — Discharge Instructions (Addendum)
Incidental finding of left-sided adrenal gland abnormality that was present and CT scan of January 2022 recommend follow-up with your primary care doctor for this.  CT scans otherwise negative CT head neck thoracic spine and lumbar spine without any bony abnormalities.  If symptoms persist may require MRI.  Recommend Motrin for the pain.
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
## 2022-09-22 NOTE — ED Notes (Signed)
Patient to CT via WC

## 2022-09-22 NOTE — Discharge Instructions (Signed)
Go to the emergency department as soon as you leave urgent care for further evaluation and management. 

## 2022-09-26 ENCOUNTER — Other Ambulatory Visit: Payer: Self-pay

## 2022-09-26 ENCOUNTER — Encounter
Payer: Commercial Managed Care - PPO | Attending: Physical Medicine & Rehabilitation | Admitting: Physical Medicine & Rehabilitation

## 2022-09-26 ENCOUNTER — Other Ambulatory Visit (HOSPITAL_BASED_OUTPATIENT_CLINIC_OR_DEPARTMENT_OTHER): Payer: Self-pay

## 2022-09-26 ENCOUNTER — Other Ambulatory Visit (HOSPITAL_COMMUNITY): Payer: Self-pay

## 2022-09-26 ENCOUNTER — Encounter: Payer: Self-pay | Admitting: Physical Medicine & Rehabilitation

## 2022-09-26 VITALS — BP 131/89 | HR 80 | Ht 63.0 in | Wt 227.0 lb

## 2022-09-26 DIAGNOSIS — M961 Postlaminectomy syndrome, not elsewhere classified: Secondary | ICD-10-CM | POA: Diagnosis not present

## 2022-09-26 DIAGNOSIS — M5416 Radiculopathy, lumbar region: Secondary | ICD-10-CM | POA: Diagnosis not present

## 2022-09-26 MED ORDER — METHYLPREDNISOLONE 4 MG PO TABS
ORAL_TABLET | ORAL | 0 refills | Status: AC
  Filled 2022-09-26: qty 26, 8d supply, fill #0

## 2022-09-26 NOTE — Patient Instructions (Signed)
Back Exercises These exercises help to make your trunk and back strong. They also help to keep the lower back flexible. Doing these exercises can help to prevent or lessen pain in your lower back. If you have back pain, try to do these exercises 2-3 times each day or as told by your doctor. As you get better, do the exercises once each day. Repeat the exercises more often as told by your doctor. To stop back pain from coming back, do the exercises once each day, or as told by your doctor. Do exercises exactly as told by your doctor. Stop right away if you feel sudden pain or your pain gets worse. Exercises Single knee to chest Do these steps 3-5 times in a row for each leg: Lie on your back on a firm bed or the floor with your legs stretched out. Bring one knee to your chest. Grab your knee or thigh with both hands and hold it in place. Pull on your knee until you feel a gentle stretch in your lower back or butt. Keep doing the stretch for 10-30 seconds. Slowly let go of your leg and straighten it. Pelvic tilt Do these steps 5-10 times in a row: Lie on your back on a firm bed or the floor with your legs stretched out. Bend your knees so they point up to the ceiling. Your feet should be flat on the floor. Tighten your lower belly (abdomen) muscles to press your lower back against the floor. This will make your tailbone point up to the ceiling instead of pointing down to your feet or the floor. Stay in this position for 5-10 seconds while you gently tighten your muscles and breathe evenly. Cat-cow Do these steps until your lower back bends more easily: Get on your hands and knees on a firm bed or the floor. Keep your hands under your shoulders, and keep your knees under your hips. You may put padding under your knees. Let your head hang down toward your chest. Tighten (contract) the muscles in your belly. Point your tailbone toward the floor so your lower back becomes rounded like the back of a  cat. Stay in this position for 5 seconds. Slowly lift your head. Let the muscles of your belly relax. Point your tailbone up toward the ceiling so your back forms a sagging arch like the back of a cow. Stay in this position for 5 seconds.  Press-ups Do these steps 5-10 times in a row: Lie on your belly (face-down) on a firm bed or the floor. Place your hands near your head, about shoulder-width apart. While you keep your back relaxed and keep your hips on the floor, slowly straighten your arms to raise the top half of your body and lift your shoulders. Do not use your back muscles. You may change where you place your hands to make yourself more comfortable. Stay in this position for 5 seconds. Keep your back relaxed. Slowly return to lying flat on the floor.  Bridges Do these steps 10 times in a row: Lie on your back on a firm bed or the floor. Bend your knees so they point up to the ceiling. Your feet should be flat on the floor. Your arms should be flat at your sides, next to your body. Tighten your butt muscles and lift your butt off the floor until your waist is almost as high as your knees. If you do not feel the muscles working in your butt and the back of   your thighs, slide your feet 1-2 inches (2.5-5 cm) farther away from your butt. Stay in this position for 3-5 seconds. Slowly lower your butt to the floor, and let your butt muscles relax. If this exercise is too easy, try doing it with your arms crossed over your chest. Belly crunches Do these steps 5-10 times in a row: Lie on your back on a firm bed or the floor with your legs stretched out. Bend your knees so they point up to the ceiling. Your feet should be flat on the floor. Cross your arms over your chest. Tip your chin a little bit toward your chest, but do not bend your neck. Tighten your belly muscles and slowly raise your chest just enough to lift your shoulder blades a tiny bit off the floor. Avoid raising your body  higher than that because it can put too much stress on your lower back. Slowly lower your chest and your head to the floor. Back lifts Do these steps 5-10 times in a row: Lie on your belly (face-down) with your arms at your sides, and rest your forehead on the floor. Tighten the muscles in your legs and your butt. Slowly lift your chest off the floor while you keep your hips on the floor. Keep the back of your head in line with the curve in your back. Look at the floor while you do this. Stay in this position for 3-5 seconds. Slowly lower your chest and your face to the floor. Contact a doctor if: Your back pain gets a lot worse when you do an exercise. Your back pain does not get better within 2 hours after you exercise. If you have any of these problems, stop doing the exercises. Do not do them again unless your doctor says it is okay. Get help right away if: You have sudden, very bad back pain. If this happens, stop doing the exercises. Do not do them again unless your doctor says it is okay. This information is not intended to replace advice given to you by your health care provider. Make sure you discuss any questions you have with your health care provider. Document Revised: 09/15/2020 Document Reviewed: 09/15/2020 [employer] Patient Education  2023 [employer]  
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
## 2022-09-27 ENCOUNTER — Encounter: Payer: Self-pay | Admitting: Family Medicine

## 2022-09-27 ENCOUNTER — Ambulatory Visit: Payer: Commercial Managed Care - PPO | Admitting: Family Medicine

## 2022-09-27 VITALS — BP 122/74 | HR 77 | Temp 98.2°F | Ht 63.0 in | Wt 227.4 lb

## 2022-09-27 DIAGNOSIS — N2889 Other specified disorders of kidney and ureter: Secondary | ICD-10-CM | POA: Diagnosis not present

## 2022-09-27 DIAGNOSIS — R1900 Intra-abdominal and pelvic swelling, mass and lump, unspecified site: Secondary | ICD-10-CM | POA: Diagnosis not present

## 2022-09-27 DIAGNOSIS — R937 Abnormal findings on diagnostic imaging of other parts of musculoskeletal system: Secondary | ICD-10-CM | POA: Diagnosis not present

## 2022-09-27 NOTE — Patient Instructions (Signed)
We probably don't need to do anything.   If you don't hear anything by tomorrow, please send me a message asking for an update.  Let us know if you need anything.
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
## 2022-10-02 ENCOUNTER — Other Ambulatory Visit (INDEPENDENT_AMBULATORY_CARE_PROVIDER_SITE_OTHER): Payer: Self-pay

## 2022-10-02 DIAGNOSIS — Z0289 Encounter for other administrative examinations: Secondary | ICD-10-CM

## 2022-10-02 DIAGNOSIS — G932 Benign intracranial hypertension: Secondary | ICD-10-CM | POA: Diagnosis not present

## 2022-10-02 DIAGNOSIS — Z79899 Other long term (current) drug therapy: Secondary | ICD-10-CM

## 2022-10-03 LAB — BASIC METABOLIC PANEL
BUN/Creatinine Ratio: 13 (ref 9–23)
BUN: 12 mg/dL (ref 6–20)
CO2: 21 mmol/L (ref 20–29)
Calcium: 10 mg/dL (ref 8.7–10.2)
Chloride: 103 mmol/L (ref 96–106)
Creatinine, Ser: 0.94 mg/dL (ref 0.57–1.00)
Glucose: 80 mg/dL (ref 70–99)
Potassium: 4.2 mmol/L (ref 3.5–5.2)
Sodium: 141 mmol/L (ref 134–144)
eGFR: 80 mL/min/{1.73_m2} (ref >59)

## 2022-10-07 ENCOUNTER — Ambulatory Visit (HOSPITAL_BASED_OUTPATIENT_CLINIC_OR_DEPARTMENT_OTHER)
Admission: RE | Admit: 2022-10-07 | Discharge: 2022-10-07 | Disposition: A | Discharge status: Home / Self Care | Payer: Commercial Managed Care - PPO | Source: Ambulatory Visit | Attending: Family Medicine | Admitting: Family Medicine

## 2022-10-07 DIAGNOSIS — E278 Other specified disorders of adrenal gland: Secondary | ICD-10-CM | POA: Diagnosis not present

## 2022-10-07 DIAGNOSIS — R1900 Intra-abdominal and pelvic swelling, mass and lump, unspecified site: Secondary | ICD-10-CM | POA: Diagnosis not present

## 2022-10-07 DIAGNOSIS — R937 Abnormal findings on diagnostic imaging of other parts of musculoskeletal system: Secondary | ICD-10-CM

## 2022-10-07 DIAGNOSIS — N2889 Other specified disorders of kidney and ureter: Secondary | ICD-10-CM | POA: Diagnosis not present

## 2022-10-07 MED ORDER — GADOBUTROL 1 MMOL/ML IV SOLN
10.0000 mL | Freq: Once | INTRAVENOUS | Status: AC | PRN
  Administered 2022-10-07: 10 mL via INTRAVENOUS

## 2022-10-09 ENCOUNTER — Other Ambulatory Visit: Payer: Self-pay | Admitting: Family Medicine

## 2022-10-09 DIAGNOSIS — R1900 Intra-abdominal and pelvic swelling, mass and lump, unspecified site: Secondary | ICD-10-CM

## 2022-10-11 ENCOUNTER — Telehealth: Payer: Self-pay | Admitting: *Deleted

## 2022-10-11 DIAGNOSIS — R1900 Intra-abdominal and pelvic swelling, mass and lump, unspecified site: Secondary | ICD-10-CM

## 2022-10-11 NOTE — Telephone Encounter (Signed)
[facility] called back about referral.  Dr. Harlow Asa reviewed pt chart and recommends she see Dr. Raynelle Bring at [facility].

## 2022-10-17 ENCOUNTER — Other Ambulatory Visit (HOSPITAL_BASED_OUTPATIENT_CLINIC_OR_DEPARTMENT_OTHER): Payer: Self-pay

## 2022-10-17 ENCOUNTER — Ambulatory Visit: Payer: Commercial Managed Care - PPO | Admitting: Physician Assistant

## 2022-10-17 ENCOUNTER — Encounter: Payer: Self-pay | Admitting: Physician Assistant

## 2022-10-17 VITALS — BP 102/70 | HR 97 | Temp 97.7°F | Ht 63.0 in | Wt 221.0 lb

## 2022-10-17 DIAGNOSIS — R052 Subacute cough: Secondary | ICD-10-CM | POA: Diagnosis not present

## 2022-10-17 MED ORDER — AMOXICILLIN-POT CLAVULANATE 875-125 MG PO TABS
1.0000 | ORAL_TABLET | Freq: Two times a day (BID) | ORAL | 0 refills | Status: DC
  Filled 2022-10-17: qty 14, 7d supply, fill #0

## 2022-10-17 NOTE — Patient Instructions (Signed)
It was great to see you!  Start augmentin  Push fluids and rest as able  Follow-up with Dr Nani Ravens if any further concerns  Take care,  Inda Coke PA-C

## 2022-10-17 NOTE — Progress Notes (Signed)
Helen Wells is a 37 y.o. female here for a new problem.  History of Present Illness:   Chief Complaint  Patient presents with   Cough    Pt was positive for COVID on 3/24. She is c/o lingering cough. Chest congestion, fatigue.     HPI  Cough:  She complains of a lingering cough, fatigue, and chest congestion after testing positive for Covid on 3/24. She has not taken any prescribed medications to help symptoms.  Denies any diarrhea.  She works at a daycare and has previous had Covid 3 other times.  The first 2 times she was asymptomatic and the 3rd time she only had diarrhea.  No hx of COPD or asthma.   Past Medical History:  Diagnosis Date   Anemia    Anemia    Anxiety    Clotting disorder    Hyperlipemia    Hypertension    Hypertension      Social History   Tobacco Use   Smoking status: Never   Smokeless tobacco: Never  Substance Use Topics   Alcohol use: Not Currently    Alcohol/week: 1.0 standard drink of alcohol    Types: 1 Shots of liquor per week   Drug use: Never    Past Surgical History:  Procedure Laterality Date   Gallbladder Removal 2017     lancinectomy and Discectomy 123XX123     Dr Cathren Laine - Neurosurgeon    Family History  Problem Relation Age of Onset   Hypertension Mother    Arthritis Mother    High blood pressure Mother    Hypertension Father    Birth defects Father    High Cholesterol Father    Learning disabilities Father    Alzheimer's disease Son     Allergies  Allergen Reactions   Bupivacaine-Meloxicam Er     Other reaction(s): Unknown   Erythromycin     Blind Other reaction(s): Other 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 Other reaction(s): Other Informed by her MD not to  take any Tetracyclines. Informed by her MD not to take any Tetracyclines. Blind    Erythromycin Base     Other reaction(s): Other (See Comments) Other    Macrolides And Ketolides     Other reaction(s): Other (See Comments), Other (See Comments) Blindness, Pseudotumor Cerebri  Blindness, Pseudotumor Cerebri      Current Medications:   Current Outpatient Medications:    amLODipine (NORVASC) 5 MG tablet, Take 1 tablet (5 mg total) by mouth daily., Disp: 90 tablet, Rfl: 2   butalbital-acetaminophen-caffeine (FIORICET) 50-325-40 MG tablet, Take 1 tablet by mouth every 6 (six) hours as needed for headache., Disp: 30 tablet, Rfl: 1   furosemide (LASIX) 20 MG tablet, Take 1 tablet (20 mg total) by mouth daily., Disp: 30 tablet, Rfl: 5   lisinopril (ZESTRIL) 40 MG tablet, Take 1 tablet (40 mg total) by mouth daily., Disp: 90 tablet, Rfl: 3   Review of Systems:   Review of Systems  Constitutional:  Positive for malaise/fatigue.  HENT:  Positive for congestion (chest).   Respiratory:  Positive for cough (lingering).     Vitals:   Vitals:   10/17/22 1536  BP: 102/70  Pulse: 97  Temp: 97.7  F (36.5 C)  TempSrc: Temporal  SpO2: 98%  Weight: 221 lb (100.2 kg)  Height: 5\' 3"  (1.6 m)     Body mass index is 39.15 kg/m.  Physical Exam:   Physical Exam Vitals and nursing note reviewed.  Constitutional:      General: She is not in acute distress.    Appearance: She is well-developed. She is not ill-appearing or toxic-appearing.  HENT:     Head: Normocephalic and atraumatic.     Right Ear: Tympanic membrane, ear canal and external ear normal. Tympanic membrane is not erythematous, retracted or bulging.     Left Ear: Tympanic membrane, ear canal and external ear normal. Tympanic membrane is not erythematous, retracted or bulging.     Nose: Nose normal.     Right Sinus: No maxillary sinus tenderness or frontal sinus tenderness.     Left Sinus: No maxillary sinus tenderness or  frontal sinus tenderness.     Mouth/Throat:     Pharynx: Uvula midline. No posterior oropharyngeal erythema.  Eyes:     General: Lids are normal.     Conjunctiva/sclera: Conjunctivae normal.  Neck:     Trachea: Trachea normal.  Cardiovascular:     Rate and Rhythm: Normal rate and regular rhythm.     Heart sounds: Normal heart sounds, S1 normal and S2 normal.  Pulmonary:     Effort: Pulmonary effort is normal.     Breath sounds: Normal breath sounds. No decreased breath sounds, wheezing, rhonchi or rales.  Lymphadenopathy:     Cervical: No cervical adenopathy.  Skin:    General: Skin is warm and dry.  Neurological:     Mental Status: She is alert.  Psychiatric:        Speech: Speech normal.        Behavior: Behavior normal. Behavior is cooperative.     Assessment and Plan:   Subacute cough No red flags on exam.  Will initiate augmentin for suspected post-COVID sinusitis per orders. Discussed taking medications as prescribed. Reviewed return precautions including worsening fever, SOB, worsening cough or other concerns. Push fluids and rest. I recommend that patient follow-up if symptoms worsen or persist despite treatment x 7-10 days, sooner if needed.  I,Rachel Rivera,acting as a Education administrator for [employer], PA.,have documented all relevant documentation on the behalf of Inda Coke, PA,as directed by  Inda Coke, PA while in the presence of Inda Coke, [place].  I, Inda Coke, [place], have reviewed all documentation for this visit. The documentation on 10/17/22 for the exam, diagnosis, procedures, and orders are all accurate and complete.   Inda Coke, PA-C

## 2022-10-26 ENCOUNTER — Encounter
Payer: Commercial Managed Care - PPO | Attending: Physical Medicine & Rehabilitation | Admitting: Physical Medicine & Rehabilitation

## 2022-10-26 DIAGNOSIS — M961 Postlaminectomy syndrome, not elsewhere classified: Secondary | ICD-10-CM | POA: Insufficient documentation

## 2022-10-26 DIAGNOSIS — M461 Sacroiliitis, not elsewhere classified: Secondary | ICD-10-CM | POA: Insufficient documentation

## 2022-10-31 ENCOUNTER — Encounter (HOSPITAL_BASED_OUTPATIENT_CLINIC_OR_DEPARTMENT_OTHER): Payer: Commercial Managed Care - PPO | Admitting: Physical Medicine & Rehabilitation

## 2022-10-31 ENCOUNTER — Encounter: Payer: Self-pay | Admitting: Physical Medicine & Rehabilitation

## 2022-10-31 VITALS — BP 130/87 | HR 67 | Ht 63.0 in | Wt 223.0 lb

## 2022-10-31 DIAGNOSIS — M961 Postlaminectomy syndrome, not elsewhere classified: Secondary | ICD-10-CM

## 2022-10-31 DIAGNOSIS — M461 Sacroiliitis, not elsewhere classified: Secondary | ICD-10-CM | POA: Diagnosis not present

## 2022-10-31 NOTE — Patient Instructions (Signed)
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
## 2022-11-20 ENCOUNTER — Telehealth: Payer: Self-pay | Admitting: Adult Health

## 2022-11-20 ENCOUNTER — Other Ambulatory Visit (HOSPITAL_BASED_OUTPATIENT_CLINIC_OR_DEPARTMENT_OTHER): Payer: Self-pay

## 2022-11-20 ENCOUNTER — Telehealth: Payer: Self-pay | Admitting: Neurology

## 2022-11-20 DIAGNOSIS — G932 Benign intracranial hypertension: Secondary | ICD-10-CM

## 2022-11-20 DIAGNOSIS — G43709 Chronic migraine without aura, not intractable, without status migrainosus: Secondary | ICD-10-CM

## 2022-11-20 DIAGNOSIS — D3501 Benign neoplasm of right adrenal gland: Secondary | ICD-10-CM | POA: Diagnosis not present

## 2022-11-20 DIAGNOSIS — D1771 Benign lipomatous neoplasm of kidney: Secondary | ICD-10-CM | POA: Diagnosis not present

## 2022-11-20 MED ORDER — RIZATRIPTAN BENZOATE 10 MG PO TBDP
10.0000 mg | ORAL_TABLET | ORAL | 6 refills | Status: DC | PRN
  Filled 2022-11-20: qty 12, 28d supply, fill #0

## 2022-11-20 NOTE — Telephone Encounter (Signed)
Called the patient back. Pt states in Dec she completed a spinal tap because of the symptoms she was having with pseudotumor cerebri. She states that after spinal tap, her symptoms were resolved and she has been doing well. A week ago she states her symptoms have worsened more so than previously stated. Her vision changes are significant. She is having constant daily headaches. She has a eye MD apt scheduled 6/12 and will see if they can see her sooner. In the meantime since her visit with Shanda Bumps she had been taking the furosemide 20 mg daily daily and concerned that is not working for her.  Previously tried and failed topiramate and diamox and had SE.  It was mentioned trying zonisimide daily at the previous apt and at the time, pt was hesitant. Pt was agreeable to trying that if Dr Terrace Arabia agrees that is the next plan. Her next apt was in July and has been pushed to August and she is hoping that this can be addressed soon. Informed her Shanda Bumps was out of the office but I would bring this to Dr Zannie Cove attention and see what her thoughts and recommendations would be

## 2022-11-20 NOTE — Telephone Encounter (Signed)
Pt has called back to report that for a week now all of her Pseudotumor cerebri symptoms have returned.  Pt is asking for a call to discuss.
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
## 2022-11-21 DIAGNOSIS — E236 Other disorders of pituitary gland: Secondary | ICD-10-CM | POA: Diagnosis not present

## 2022-11-21 DIAGNOSIS — G932 Benign intracranial hypertension: Secondary | ICD-10-CM | POA: Diagnosis not present

## 2022-11-21 DIAGNOSIS — R519 Headache, unspecified: Secondary | ICD-10-CM | POA: Diagnosis not present

## 2022-11-21 DIAGNOSIS — H4710 Unspecified papilledema: Secondary | ICD-10-CM | POA: Diagnosis not present

## 2022-11-21 NOTE — Telephone Encounter (Signed)
Call back to patient to follow up on Lp discussion from yesterday. Patient states that she saw her eye Dr. 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 Patient states she wants to keep the LP but agrees it's a short term fix. Patient previously tried Topamax, diamox, and lasix. She is aware based on conversation with Ihor [place], NP that there are limited medications to help. She is likely willing to try the zonisamide if that is the only other medication that can be offered. She states her migraines/headaches are manageable and doesn't want to take Maxalt, she wants to deal with the pressure problem. She has previously lost her vision before and afraid of that again. Please advise on mediations.

## 2022-11-21 NOTE — Telephone Encounter (Signed)
Pt called back needing to speak to the RN regarding the Spinal Tap treatment discussed yesterday. Please advise.

## 2022-11-21 NOTE — Telephone Encounter (Signed)
Error

## 2022-11-22 ENCOUNTER — Other Ambulatory Visit (HOSPITAL_BASED_OUTPATIENT_CLINIC_OR_DEPARTMENT_OTHER): Payer: Self-pay

## 2022-11-22 MED ORDER — ACETAZOLAMIDE 250 MG PO TABS
250.0000 mg | ORAL_TABLET | Freq: Two times a day (BID) | ORAL | 11 refills | Status: DC
  Filled 2022-11-22: qty 60, 30d supply, fill #0
  Filled 2022-12-20 – 2022-12-25 (×2): qty 60, 30d supply, fill #1

## 2022-11-22 NOTE — Addendum Note (Signed)
Addended by: Levert Feinstein on: 11/22/2022 05:02 PM   Modules accepted: Orders

## 2022-11-22 NOTE — Telephone Encounter (Signed)
I have called patient,   She has no improvement taking Lasix,  Previously complains of numbness of 4 months, fingertips taking Diamox, want to retry it again,  Agreed to proceed with a lumbar puncture  Referral to weight loss program  Meds ordered this encounter  Medications   rizatriptan (MAXALT-MLT) 10 MG disintegrating tablet    Sig: Take 1 tablet (10 mg total) by mouth as needed. May repeat in 2 hours if needed    Dispense:  12 tablet    Refill:  6   acetaZOLAMIDE (DIAMOX) 250 MG tablet    Sig: Take 1 tablet (250 mg total) by mouth 2 (two) times daily.    Dispense:  60 tablet    Refill:  11

## 2022-11-23 NOTE — Progress Notes (Signed)
  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 Other reaction(s): Other Informed by her MD not to take any Tetracyclines. Informed by her MD not to take any Tetracyclines. Blind    Erythromycin Base     Other reaction(s): Other (See Comments) Other    Macrolides And Ketolides     Other reaction(s): Other (See Comments), Other (See Comments) Blindness, Pseudotumor Cerebri  Blindness, Pseudotumor Cerebri      Consent Signed: Yes.    Is patient diabetic? No.  CBG today? .  Pregnant: No. LMP: No LMP recorded. (age 23-55)  Anticoagulants: no Anti-inflammatory: no Antibiotics: no  Procedure: Left Sacroiliac Injection   Position: Prone Start Time: 1:41 PM  End Time: 1:47 pm  Fluoro Time: 31  RN/CMA Aranza Geddes MA Yilin Weedon MA    Time 1:04PM 1:15 pm    BP 112/76 131/86    Pulse 81 74    Respirations 16 16    O2 Sat 99 100    S/S 6 6    Pain Level 4/10 0/10     D/C home with Self, patient A & O X 3, D/C instructions reviewed, and sits independently.

## 2022-11-24 ENCOUNTER — Encounter
Payer: Commercial Managed Care - PPO | Attending: Physical Medicine & Rehabilitation | Admitting: Physical Medicine & Rehabilitation

## 2022-11-24 ENCOUNTER — Encounter: Payer: Self-pay | Admitting: Physical Medicine & Rehabilitation

## 2022-11-24 ENCOUNTER — Other Ambulatory Visit (HOSPITAL_BASED_OUTPATIENT_CLINIC_OR_DEPARTMENT_OTHER): Payer: Self-pay

## 2022-11-24 VITALS — BP 112/76 | HR 81 | Temp 98.2°F | Ht 63.0 in | Wt 225.0 lb

## 2022-11-24 DIAGNOSIS — M961 Postlaminectomy syndrome, not elsewhere classified: Secondary | ICD-10-CM | POA: Diagnosis not present

## 2022-11-24 DIAGNOSIS — M461 Sacroiliitis, not elsewhere classified: Secondary | ICD-10-CM | POA: Diagnosis not present

## 2022-11-24 MED ORDER — LIDOCAINE HCL (PF) 2 % IJ SOLN
5.0000 mL | Freq: Once | INTRAMUSCULAR | Status: AC
  Administered 2022-11-24: 5 mL

## 2022-11-24 MED ORDER — LIDOCAINE HCL 1 % IJ SOLN
5.0000 mL | Freq: Once | INTRAMUSCULAR | Status: AC
  Administered 2022-11-24: 5 mL

## 2022-11-24 MED ORDER — LIDOCAINE HCL (PF) 1 % IJ SOLN: 2.0000 mL | Freq: Once | INTRAMUSCULAR | Status: DC

## 2022-11-24 MED ORDER — IOHEXOL 180 MG/ML  SOLN
3.0000 mL | Freq: Once | INTRAMUSCULAR | Status: AC
  Administered 2022-11-24: 3 mL via INTRAVENOUS

## 2022-11-24 MED ORDER — BETAMETHASONE SOD PHOS & ACET 6 (3-3) MG/ML IJ SUSP
12.0000 mg | Freq: Once | INTRAMUSCULAR | Status: AC
  Administered 2022-11-24: 12 mg via INTRAMUSCULAR

## 2022-11-24 NOTE — Progress Notes (Signed)
Left sacroiliac injection under fluoroscopic guidance  Indication: Left Low back and buttocks pain not relieved by medication management and other conservative care.  Informed consent was obtained after describing risks and benefits of the procedure with the patient, this includes bleeding, bruising, infection, paralysis and medication side effects. The patient wishes to proceed and has given written consent. The patient was placed in a prone position. The lumbar and sacral area was marked and prepped with Betadine. A 25-gauge 1-1/2 inch needle was inserted into the skin and subcutaneous tissue and 1 mL of 1% lidocaine was injected. Then a 25-gauge 3.5 inch spinal needle was inserted under fluoroscopic guidance into the left sacroiliac joint. AP and lateral images were utilized. Omnipaque 180 x0.5 mL under live fluoroscopy demonstrated no intravascular uptake, SI jt with partial filling  . Then a solution containing one ML of 6 mg per mLbetamethasone and 2 ML of 2% lidocaine MPF was injected x1.5 mL. Patient tolerated the procedure well. Post procedure instructions were given. Please see post procedure form.

## 2022-11-24 NOTE — Patient Instructions (Signed)
Sacroiliac injection today

## 2022-11-24 NOTE — Addendum Note (Signed)
Addended by: Becky Sax on: 11/24/2022 03:26 PM   Modules accepted: Orders

## 2022-11-24 NOTE — Addendum Note (Signed)
Addended by: Becky Sax on: 11/24/2022 02:44 PM   Modules accepted: Orders

## 2022-11-28 NOTE — Telephone Encounter (Signed)
Advise her call back if she developed post LP headaches, will order blood patch then

## 2022-11-28 NOTE — Telephone Encounter (Signed)
Received incoming call from [facility] ,RN at GI stating patient is scheduled to have LP tomorrow and was told from patient she had two LP before and had to get blood patch. She requested it be ordered prior to having LP completed. Previous complications post LP noted in 07/06/22 TE.  I will send to Dr Terrace Arabia, if she agrees, I will place order for blood patch.

## 2022-11-29 ENCOUNTER — Ambulatory Visit
Admission: RE | Admit: 2022-11-29 | Discharge: 2022-11-29 | Disposition: A | Discharge status: Home / Self Care | Payer: Commercial Managed Care - PPO | Source: Ambulatory Visit | Attending: Neurology | Admitting: Neurology

## 2022-11-29 ENCOUNTER — Encounter (INDEPENDENT_AMBULATORY_CARE_PROVIDER_SITE_OTHER): Payer: Commercial Managed Care - PPO | Admitting: Neurology

## 2022-11-29 DIAGNOSIS — G932 Benign intracranial hypertension: Secondary | ICD-10-CM

## 2022-11-29 DIAGNOSIS — G43709 Chronic migraine without aura, not intractable, without status migrainosus: Secondary | ICD-10-CM

## 2022-11-29 MED ORDER — BUTALBITAL-APAP-CAFFEINE 50-325-40 MG PO TABS
1.0000 | ORAL_TABLET | Freq: Four times a day (QID) | ORAL | 1 refills | Status: DC | PRN
  Filled 2022-11-29: qty 30, 8d supply, fill #0
  Filled 2023-02-05: qty 30, 8d supply, fill #1

## 2022-11-29 NOTE — Telephone Encounter (Addendum)
See TE 11/20/22

## 2022-11-29 NOTE — Discharge Instructions (Signed)

## 2022-11-29 NOTE — Telephone Encounter (Signed)
I called patient post LP, overall she is doing better reported significant improvement of her headache as soon as she had lumbar puncture, opening pressure was 24 cmH2O  She only has mild pressure headache, not her typical post LP headache, discussed with patient, agreed not to do lumbar puncture unless she has developed significant post LP headache, advised her to increase water and caffeine beverage, lying flat  She will call back tomorrow May 16 if she has developed positional related post LP headache, if needed we may order blood patch then  Please see the MyChart message reply(ies) for my assessment and plan.    This patient gave consent for this Medical Advice Message and is aware that it may result in a bill to [employer], as well as the possibility of receiving a bill for a co-payment or deductible. They are an established patient, but are not seeking medical advice exclusively about a problem treated during an in person or video visit in the last seven days. I did not recommend an in person or video visit within seven days of my reply.    I spent a total of 10 minutes cumulative time within 7 days through [employer].  Levert Feinstein, MD

## 2022-11-29 NOTE — Telephone Encounter (Signed)
Helen Wells has been notified via Epic.

## 2022-11-29 NOTE — Addendum Note (Signed)
Addended by: Levert Feinstein on: 11/29/2022 05:59 PM   Modules accepted: Orders
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
## 2022-11-30 ENCOUNTER — Other Ambulatory Visit (HOSPITAL_BASED_OUTPATIENT_CLINIC_OR_DEPARTMENT_OTHER): Payer: Self-pay

## 2022-12-04 ENCOUNTER — Telehealth: Payer: Self-pay | Admitting: Neurology

## 2022-12-04 DIAGNOSIS — G971 Other reaction to spinal and lumbar puncture: Secondary | ICD-10-CM

## 2022-12-04 DIAGNOSIS — G932 Benign intracranial hypertension: Secondary | ICD-10-CM

## 2022-12-04 NOTE — Telephone Encounter (Signed)
I called patient, she only has mild headache post LP on May 15, improved after lying flat, hydration, Fioricet as needed  Encouraged her to reach out to our office if she develops significant positional related post LP headache, consider blood patch if needed,

## 2022-12-15 ENCOUNTER — Other Ambulatory Visit (HOSPITAL_BASED_OUTPATIENT_CLINIC_OR_DEPARTMENT_OTHER): Payer: Self-pay

## 2022-12-22 ENCOUNTER — Encounter: Payer: Self-pay | Admitting: Family Medicine

## 2022-12-22 ENCOUNTER — Other Ambulatory Visit (HOSPITAL_BASED_OUTPATIENT_CLINIC_OR_DEPARTMENT_OTHER): Payer: Self-pay

## 2022-12-22 ENCOUNTER — Ambulatory Visit: Payer: Commercial Managed Care - PPO | Admitting: Family Medicine

## 2022-12-22 VITALS — BP 120/80 | HR 74 | Temp 98.4°F | Ht 63.0 in | Wt 221.1 lb

## 2022-12-22 DIAGNOSIS — I10 Essential (primary) hypertension: Secondary | ICD-10-CM | POA: Diagnosis not present

## 2022-12-22 MED ORDER — AMLODIPINE BESYLATE 5 MG PO TABS
5.0000 mg | ORAL_TABLET | Freq: Every day | ORAL | 3 refills | Status: DC
  Filled 2022-12-22 – 2023-03-12 (×2): qty 90, 90d supply, fill #0

## 2022-12-22 MED ORDER — LISINOPRIL 40 MG PO TABS
40.0000 mg | ORAL_TABLET | Freq: Every day | ORAL | 3 refills | Status: DC
  Filled 2022-12-22 – 2023-02-12 (×2): qty 90, 90d supply, fill #0

## 2022-12-22 NOTE — Progress Notes (Signed)
Chief Complaint  Patient presents with   Follow-up    Subjective Helen Wells is a 37 y.o. female who presents for hypertension follow up. She does monitor home blood pressures. Blood pressures ranging from 110's/70's on average. She is compliant with medications- Norvasc 5 mg/d, lisinopril 40 mg/d. Patient has these side effects of medication: none She is usually adhering to a healthy diet overall. Current exercise: walking, elliptical No CP or SOB.    Past Medical History:  Diagnosis Date   Anemia    Anemia    Anxiety    Clotting disorder (HCC)    Hyperlipemia    Hypertension    Hypertension     Exam BP 120/80 (BP Location: Left Arm, Patient Position: Sitting, Cuff Size: Normal)   Pulse 74   Temp 98.4 F (36.9 C) (Oral)   Ht 5\' 3"  (1.6 m)   Wt 221 lb 2 oz (100.3 kg)   SpO2 99%   BMI 39.17 kg/m  General:  well developed, well nourished, in no apparent distress Heart: RRR, no bruits, no LE edema Lungs: clear to auscultation, no accessory muscle use Psych: well oriented with normal range of affect and appropriate judgment/insight  Primary hypertension - Plan: amLODipine (NORVASC) 5 MG tablet, lisinopril (ZESTRIL) 40 MG tablet  Chronic, stable. Cont lisinopril 40 mg/d, Norvasc 5 mg/d. Counseled on diet and exercise.  F/u in 6 mo. The patient voiced understanding and agreement to the plan.  [name] [place], DO 12/22/22  8:58 AM

## 2022-12-22 NOTE — Patient Instructions (Signed)
Keep the diet clean and stay active.  Dr. Dalbert Garnet: [phone]  Let us know if you need anything.

## 2023-01-09 ENCOUNTER — Other Ambulatory Visit (HOSPITAL_BASED_OUTPATIENT_CLINIC_OR_DEPARTMENT_OTHER): Payer: Self-pay

## 2023-01-09 ENCOUNTER — Telehealth: Payer: Self-pay | Admitting: Neurology

## 2023-01-09 DIAGNOSIS — G43709 Chronic migraine without aura, not intractable, without status migrainosus: Secondary | ICD-10-CM

## 2023-01-09 DIAGNOSIS — G932 Benign intracranial hypertension: Secondary | ICD-10-CM

## 2023-01-09 MED ORDER — AJOVY 225 MG/1.5ML ~~LOC~~ SOAJ
225.0000 mg | SUBCUTANEOUS | 11 refills | Status: DC
  Filled 2023-01-09: qty 1.5, 30d supply, fill #0

## 2023-01-09 MED ORDER — ACETAZOLAMIDE 250 MG PO TABS
500.0000 mg | ORAL_TABLET | Freq: Two times a day (BID) | ORAL | 11 refills | Status: DC
  Filled 2023-01-09 – 2023-01-19 (×4): qty 120, 30d supply, fill #0
  Filled 2023-02-15: qty 120, 30d supply, fill #1
  Filled 2023-03-19 – 2023-04-04 (×2): qty 120, 30d supply, fill #2
  Filled 2023-05-09: qty 120, 30d supply, fill #3
  Filled 2023-06-08: qty 120, 30d supply, fill #4
  Filled 2023-07-09: qty 120, 30d supply, fill #5
  Filled 2023-08-06 – 2023-08-24 (×2): qty 120, 30d supply, fill #6
  Filled 2023-09-20: qty 120, 30d supply, fill #7
  Filled 2023-10-22: qty 120, 30d supply, fill #8

## 2023-01-09 NOTE — Telephone Encounter (Signed)
Called and spoke to representative to obtain medical record and she stated they should be on the way momentarily

## 2023-01-09 NOTE — Telephone Encounter (Signed)
Pt stated she has recently had two spinal-tap. Stated the headaches and blurry vision has come back. Pt requesting a call from nurse to discuss.

## 2023-01-09 NOTE — Telephone Encounter (Signed)
I called patient, she works at summer camp, since beginning of June 2024, she began to have frequent headaches, holoacranial, more towards the left retro-orbital region, not as bad as migraine, did try Maxalt, works well for headache, tolerating Diamox 250 mg twice a day, complains change of taste bud.  I advised her to increase Diamox to 250 mg 2 tablets twice a day Add on Ajovy 225mg  subcutaneous once every month as migraine prevention Maxalt as needed for migraine abortive treatment  Get medical record from her ophthalmologist Dr. Dione Booze,  Keep follow-up in August

## 2023-01-09 NOTE — Telephone Encounter (Signed)
Called and spoke w/pt who stated that Dr. Terrace Arabia had just literally gotten of the phone w/her. I told her that we are awaiting her medical records from [facility] and she stated that Terrace Arabia already taken care of her.

## 2023-01-10 ENCOUNTER — Other Ambulatory Visit (HOSPITAL_BASED_OUTPATIENT_CLINIC_OR_DEPARTMENT_OTHER): Payer: Self-pay

## 2023-01-10 NOTE — Telephone Encounter (Signed)
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

## 2023-01-11 ENCOUNTER — Other Ambulatory Visit (HOSPITAL_BASED_OUTPATIENT_CLINIC_OR_DEPARTMENT_OTHER): Payer: Self-pay

## 2023-01-13 ENCOUNTER — Other Ambulatory Visit (HOSPITAL_COMMUNITY): Payer: Self-pay

## 2023-01-13 ENCOUNTER — Telehealth: Payer: Self-pay

## 2023-01-13 NOTE — Telephone Encounter (Signed)
   The preferred medications are listed above-would you like to switch to one of the preferred medications? If provider prefers to stay with Ajovy the next question is     I did not find documentation in the chart that PT has tried and failed these meds-I also checked the med list in epic for the discontinued meds. Please advise.

## 2023-01-15 ENCOUNTER — Other Ambulatory Visit (HOSPITAL_BASED_OUTPATIENT_CLINIC_OR_DEPARTMENT_OTHER): Payer: Self-pay

## 2023-01-15 MED ORDER — AIMOVIG 70 MG/ML ~~LOC~~ SOAJ
70.0000 mg | SUBCUTANEOUS | 11 refills | Status: DC
  Filled 2023-01-15 – 2023-01-24 (×2): qty 1, 30d supply, fill #0

## 2023-01-15 NOTE — Telephone Encounter (Signed)
Meds ordered this encounter  Medications   Erenumab-aooe (AIMOVIG) 70 MG/ML SOAJ    Sig: Inject 70 mg into the skin every 30 (thirty) days.    Dispense:  1.12 mL    Refill:  11     Switched to preferred

## 2023-01-16 ENCOUNTER — Other Ambulatory Visit (HOSPITAL_BASED_OUTPATIENT_CLINIC_OR_DEPARTMENT_OTHER): Payer: Self-pay

## 2023-01-17 ENCOUNTER — Telehealth: Payer: Self-pay

## 2023-01-17 ENCOUNTER — Other Ambulatory Visit (HOSPITAL_COMMUNITY): Payer: Self-pay

## 2023-01-17 ENCOUNTER — Other Ambulatory Visit (HOSPITAL_BASED_OUTPATIENT_CLINIC_OR_DEPARTMENT_OTHER): Payer: Self-pay

## 2023-01-17 NOTE — Telephone Encounter (Signed)
Pharmacy Patient Advocate Encounter   Received notification from [facility] that prior authorization for Aimovig 70MG /ML auto-injectors is required/requested.   PA submitted to [facility] via CoverMyMeds Key or (Medicaid) confirmation # [id number] B Status is pending

## 2023-01-19 ENCOUNTER — Encounter: Payer: Commercial Managed Care - PPO | Admitting: Physical Medicine & Rehabilitation

## 2023-01-19 ENCOUNTER — Other Ambulatory Visit (HOSPITAL_COMMUNITY): Payer: Self-pay

## 2023-01-19 ENCOUNTER — Other Ambulatory Visit (HOSPITAL_BASED_OUTPATIENT_CLINIC_OR_DEPARTMENT_OTHER): Payer: Self-pay

## 2023-01-22 ENCOUNTER — Other Ambulatory Visit (HOSPITAL_COMMUNITY): Payer: Self-pay

## 2023-01-22 NOTE — Telephone Encounter (Signed)
Pharmacy Patient Advocate Encounter  Prior Authorization for Aimovig 70MG /ML auto-injectors has been APPROVED by [facility] from 01/17/2023 to 07/20/2023.   PA # PA Case ID #: [id number]

## 2023-01-23 ENCOUNTER — Encounter: Payer: Self-pay | Admitting: Physical Medicine & Rehabilitation

## 2023-01-23 ENCOUNTER — Other Ambulatory Visit (HOSPITAL_BASED_OUTPATIENT_CLINIC_OR_DEPARTMENT_OTHER): Payer: Self-pay

## 2023-01-23 ENCOUNTER — Encounter
Payer: Commercial Managed Care - PPO | Attending: Physical Medicine & Rehabilitation | Admitting: Physical Medicine & Rehabilitation

## 2023-01-23 VITALS — BP 111/75 | HR 81 | Ht 63.0 in | Wt 219.0 lb

## 2023-01-23 DIAGNOSIS — M961 Postlaminectomy syndrome, not elsewhere classified: Secondary | ICD-10-CM | POA: Diagnosis not present
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
## 2023-01-24 ENCOUNTER — Other Ambulatory Visit (HOSPITAL_BASED_OUTPATIENT_CLINIC_OR_DEPARTMENT_OTHER): Payer: Self-pay

## 2023-02-05 ENCOUNTER — Other Ambulatory Visit (HOSPITAL_BASED_OUTPATIENT_CLINIC_OR_DEPARTMENT_OTHER): Payer: Self-pay

## 2023-02-06 ENCOUNTER — Other Ambulatory Visit: Payer: Self-pay | Admitting: Family Medicine

## 2023-02-06 ENCOUNTER — Encounter: Payer: Self-pay | Admitting: Family Medicine

## 2023-02-06 ENCOUNTER — Other Ambulatory Visit: Payer: Self-pay

## 2023-02-08 ENCOUNTER — Ambulatory Visit: Payer: Commercial Managed Care - PPO | Admitting: Neurology

## 2023-02-12 ENCOUNTER — Other Ambulatory Visit (HOSPITAL_BASED_OUTPATIENT_CLINIC_OR_DEPARTMENT_OTHER): Payer: Self-pay

## 2023-02-14 ENCOUNTER — Encounter (INDEPENDENT_AMBULATORY_CARE_PROVIDER_SITE_OTHER): Payer: Self-pay

## 2023-02-20 ENCOUNTER — Other Ambulatory Visit (HOSPITAL_BASED_OUTPATIENT_CLINIC_OR_DEPARTMENT_OTHER): Payer: Self-pay

## 2023-02-20 ENCOUNTER — Other Ambulatory Visit: Payer: Self-pay

## 2023-02-20 ENCOUNTER — Ambulatory Visit: Payer: Commercial Managed Care - PPO | Admitting: Family

## 2023-02-20 VITALS — BP 120/72 | HR 67 | Temp 98.6°F | Resp 16 | Wt 218.0 lb

## 2023-02-20 DIAGNOSIS — I10 Essential (primary) hypertension: Secondary | ICD-10-CM | POA: Diagnosis not present

## 2023-02-20 DIAGNOSIS — G932 Benign intracranial hypertension: Secondary | ICD-10-CM

## 2023-02-20 DIAGNOSIS — K219 Gastro-esophageal reflux disease without esophagitis: Secondary | ICD-10-CM | POA: Diagnosis not present

## 2023-02-20 DIAGNOSIS — R519 Headache, unspecified: Secondary | ICD-10-CM | POA: Diagnosis not present

## 2023-02-20 MED ORDER — LISINOPRIL 40 MG PO TABS: 20.0000 mg | ORAL_TABLET | Freq: Every day | ORAL | Status: DC

## 2023-02-20 MED ORDER — KETOROLAC TROMETHAMINE 60 MG/2ML IM SOLN
60.0000 mg | Freq: Once | INTRAMUSCULAR | Status: AC
  Administered 2023-02-20: 60 mg via INTRAMUSCULAR

## 2023-02-20 MED ORDER — OMEPRAZOLE 40 MG PO CPDR
40.0000 mg | DELAYED_RELEASE_CAPSULE | Freq: Every day | ORAL | 3 refills | Status: DC
  Filled 2023-02-20: qty 30, 30d supply, fill #0
  Filled 2023-03-19 – 2023-04-04 (×2): qty 30, 30d supply, fill #1

## 2023-02-20 NOTE — Assessment & Plan Note (Signed)
Toradol shot today to help relieve the migraine pain. Continue with current medication of Maxalt. Keep follow up appointment with Neurology.

## 2023-02-20 NOTE — Assessment & Plan Note (Signed)
Reports that PCP decreased her lisinopril from 40mg  to 20mg  when her diamox was increased as her BP became low. BP looks great today. Recommended that she continue the 20mg  dose.

## 2023-02-20 NOTE — Assessment & Plan Note (Signed)
Reviewed case with Dr. Terrace Arabia. She states that on most recent LP on 5/15 but there was a lot of concern for post op HA and she required a blood patch.  She does not recommend repeating the LP at this time and recommends that she keep her upcoming appointment with neurology on 03/12/23. Patient is on the cancellation list for sooner appointment.  For the Headache, will rx with toradol for symptom management.  She is advised to go to the ER if severe/worsening HA, new/worsening vision changes. Pt verbalizes understanding.

## 2023-02-20 NOTE — Progress Notes (Unsigned)
Subjective:     Patient ID: Helen Wells, female    DOB: Nov 25, 1985, 37 y.o.   MRN: [id number]  Chief Complaint  Patient presents with   Migraine    Here for migraine    Migraine  Associated symptoms include photophobia (noted in the morning when she wakes up). Pertinent negatives include no dizziness.    37 year old female with hx of pseudotumor cerebri, who presents to the clinic today for follow up of headache pain. Patient states that she has been suffering from a "migraine" for the past four weeks. She states that she can not wait 3 weeks before seeing her scheduled neurology appointment. She is on their cancellation list.  She notes the pain is centered right behind both eyes. Patient states that the migraine started not long after her Diamox was increased to 500mg  twice a day. She states that her Maxalt 10mg  is not helping her with her migraine pain. She admits to having photophobia in the morning when she first wakes up and then it fades throughout the day.   She reports that Dr. Terrace Arabia (her neurologist) increased her diamox.  She notes that her vision has improved with the increase in diamox, however she also notes that her gerd symptoms have been pretty consistent for her her which is unusual since she went to a gluten free diet.   Health Maintenance Due  Topic Date Due   INFLUENZA VACCINE  02/15/2023    Past Medical History:  Diagnosis Date   Anemia    Anemia    Anxiety    Clotting disorder (HCC)    Hyperlipemia    Hypertension    Hypertension     Past Surgical History:  Procedure Laterality Date   Gallbladder Removal 2017     lancinectomy and Discectomy 2021     Dr Orlie Pollen - Neurosurgeon    Family History  Problem Relation Age of Onset   Hypertension Mother    Arthritis Mother    High blood pressure Mother    Hypertension Father    Birth defects Father    High Cholesterol Father    Learning disabilities Father    Alzheimer's disease Son      Social History   Socioeconomic History   Marital status: Married    Spouse name: Not on file   Number of children: Not on file   Years of education: Not on file   Highest education level: Not on file  Occupational History   Not on file  Tobacco Use   Smoking status: Never   Smokeless tobacco: Never  Substance and Sexual Activity   Alcohol use: Not Currently    Alcohol/week: 1.0 standard drink of alcohol    Types: 1 Shots of liquor per week   Drug use: Never   Sexual activity: Not on file  Other Topics Concern   Not on file  Social History Narrative   ** Merged History Encounter **       Social Determinants of Health   Financial Resource Strain: Not on file  Food Insecurity: No Food Insecurity (09/12/2021)   Received from [facility], [facility]   Hunger Vital Sign    Worried About Running Out of Food in the Last Year: Never true    Ran Out of Food in the Last Year: Never true  Transportation Needs: Not on file  Physical Activity: Not on file  Stress: Stress Concern Present (06/02/2020)   Received from [facility], [facility]  [employer] of Occupational Health - Occupational Stress Questionnaire    Feeling of Stress : To some extent  Social Connections: Unknown (11/14/2021)   Received from [facility], [facility]   Social Network    Social Network: Not on file  Intimate Partner Violence: Unknown (10/18/2021)   Received from [facility], [facility]   HITS    Physically Hurt: Not on file    Insult or Talk Down To: Not on file    Threaten Physical Harm: Not on file    Scream or Curse: Not on file    Outpatient Medications Prior to Visit  Medication Sig Dispense Refill   acetaZOLAMIDE (DIAMOX) 250 MG tablet Take 2 tablets (500 mg total) by mouth 2 (two) times daily. 120 tablet 11   amLODipine (NORVASC) 5 MG tablet Take 1 tablet (5 mg total) by mouth daily. 90 tablet 3   butalbital-acetaminophen-caffeine (FIORICET) 50-325-40 MG tablet  Take 1 tablet by mouth every 6 (six) hours as needed for headache. 30 tablet 1   rizatriptan (MAXALT-MLT) 10 MG disintegrating tablet Take 1 tablet (10 mg total) by mouth as needed. May repeat in 2 hours if needed 12 tablet 6   lisinopril (ZESTRIL) 40 MG tablet Take 1 tablet (40 mg total) by mouth daily. 90 tablet 3   No facility-administered medications prior to visit.    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 Other reaction(s): Other Informed by her MD not to take any Tetracyclines. Informed by her MD not to take any Tetracyclines. Blind    Erythromycin Base     Other reaction(s): Other (See Comments) Other    Macrolides And Ketolides     Other reaction(s): Other (See Comments), Other (See Comments) Blindness, Pseudotumor Cerebri  Blindness, Pseudotumor Cerebri      Review of Systems  Eyes:  Positive for photophobia (noted in the morning when she wakes up).  Cardiovascular:  Negative for chest pain.  Neurological:  Positive for headaches (lasting for 4 weeks now behind bilateral eyes). Negative for dizziness and loss of consciousness.       Objective:    Physical Exam Constitutional:      Appearance: Normal appearance.  HENT:     Head: Normocephalic.     Comments: Migraine noted to be behind bilateral eyes Eyes:     General: No scleral icterus.    Pupils: Pupils are equal, round, and reactive to light.  Cardiovascular:     Rate and Rhythm: Normal rate and regular rhythm.     Pulses: Normal pulses.     Heart sounds: Normal heart sounds.  Pulmonary:     Effort: Pulmonary effort is  normal.     Breath sounds: Normal breath sounds.  Musculoskeletal:        General: Normal range of motion.     Cervical back: Normal range of motion.  Neurological:     General: No focal deficit present.     Mental Status: She is alert and oriented to person, place, and time. Mental status is at baseline.     Cranial Nerves:  No cranial nerve deficit.  Psychiatric:        Mood and Affect: Mood normal.        Behavior: Behavior normal.      BP 120/72 (BP Location: Right Arm, Patient Position: Sitting, Cuff Size: Large)   Pulse 67   Temp 98.6 F (37 C) (Oral)   Resp 16   Wt 218 lb (98.9 kg)   SpO2 100%   BMI 38.62 kg/m  Wt Readings from Last 3 Encounters:  02/20/23 218 lb (98.9 kg)  01/23/23 219 lb (99.3 kg)  12/22/22 221 lb 2 oz (100.3 kg)       Assessment & Plan:   Problem List Items Addressed This Visit       Unprioritized   Pseudotumor cerebri    Reviewed case with Dr. Terrace Arabia. She states that on most recent LP on 5/15 but there was a lot of concern for post op HA and she required a blood patch.  She does not recommend repeating the LP at this time and recommends that she keep her upcoming appointment with neurology on 03/12/23. Patient is on the cancellation list for sooner appointment.  For the Headache, will rx with toradol for symptom management.  She is advised to go to the ER if severe/worsening HA, new/worsening vision changes. Pt verbalizes understanding.         Primary hypertension    Reports that PCP decreased her lisinopril from 40mg  to 20mg  when her diamox was increased as her BP became low. BP looks great today. Recommended that she continue the 20mg  dose.       Relevant Medications   lisinopril (ZESTRIL) 40 MG tablet   Other Relevant Orders   Basic Metabolic Panel (BMET)   Gastroesophageal reflux disease - Primary    Uncontrolled. Discussed gerd dietary modifications. Start omeprazole once daily for a few weeks then prn.       Relevant Medications    omeprazole (PRILOSEC) 40 MG capsule   Other Visit Diagnoses     Nonintractable headache, unspecified chronicity pattern, unspecified headache type       Relevant Medications   ketorolac (TORADOL) injection 60 mg (Completed)       I have changed Blanka A. Sabol's lisinopril. I am also having her start on omeprazole. Additionally, I am having her maintain her rizatriptan, butalbital-acetaminophen-caffeine, amLODipine, and acetaZOLAMIDE. We administered ketorolac.  Meds ordered this encounter  Medications   omeprazole (PRILOSEC) 40 MG capsule    Sig: Take 1 capsule (40 mg total) by mouth daily.    Dispense:  30 capsule    Refill:  3    Order Specific Question:   Supervising Provider    Answer:   [name] Edge A [[id number]]   lisinopril (ZESTRIL) 40 MG tablet    Sig: Take 0.5 tablets (20 mg total) by mouth daily.    Order Specific Question:   Supervising Provider    Answer:   [name] Edge A [[id number]]   ketorolac (TORADOL) injection 60 mg

## 2023-02-20 NOTE — Assessment & Plan Note (Signed)
Uncontrolled. Discussed gerd dietary modifications. Start omeprazole once daily for a few weeks then prn.

## 2023-02-21 NOTE — Patient Instructions (Signed)
Please follow up as scheduled with Neurology.

## 2023-02-26 ENCOUNTER — Other Ambulatory Visit (HOSPITAL_BASED_OUTPATIENT_CLINIC_OR_DEPARTMENT_OTHER): Payer: Self-pay

## 2023-02-26 ENCOUNTER — Other Ambulatory Visit: Payer: Self-pay | Admitting: Family Medicine

## 2023-02-27 ENCOUNTER — Other Ambulatory Visit (HOSPITAL_BASED_OUTPATIENT_CLINIC_OR_DEPARTMENT_OTHER): Payer: Self-pay

## 2023-02-27 ENCOUNTER — Other Ambulatory Visit: Payer: Self-pay | Admitting: Family Medicine

## 2023-02-27 DIAGNOSIS — I10 Essential (primary) hypertension: Secondary | ICD-10-CM

## 2023-02-27 MED ORDER — LISINOPRIL 20 MG PO TABS
20.0000 mg | ORAL_TABLET | Freq: Every day | ORAL | 3 refills | Status: DC
  Filled 2023-02-27: qty 90, 90d supply, fill #0
  Filled 2023-05-14: qty 90, 90d supply, fill #1
  Filled 2023-08-20: qty 90, 90d supply, fill #2
  Filled 2023-11-19: qty 90, 90d supply, fill #3

## 2023-02-27 NOTE — Telephone Encounter (Signed)
Pt called to follow up on medication denial for lisinopril. After reviewing chart, noted that medication was written on 8.6.24 but it looks like it was not sent in. Advised pt a note would be sent back to look into this. Pt advised that she has been without her meds for 3 days and her last BP reading last night (8.12.24) was 170/120. Routed as HP due to time sensitivity.

## 2023-03-12 ENCOUNTER — Encounter: Payer: Self-pay | Admitting: Neurology

## 2023-03-12 ENCOUNTER — Other Ambulatory Visit: Payer: Self-pay

## 2023-03-12 ENCOUNTER — Telehealth: Payer: Self-pay | Admitting: Neurology

## 2023-03-12 ENCOUNTER — Other Ambulatory Visit (HOSPITAL_BASED_OUTPATIENT_CLINIC_OR_DEPARTMENT_OTHER): Payer: Self-pay

## 2023-03-12 ENCOUNTER — Ambulatory Visit: Payer: Commercial Managed Care - PPO | Admitting: Neurology

## 2023-03-12 VITALS — BP 126/76 | HR 80 | Ht 63.0 in | Wt 218.0 lb

## 2023-03-12 DIAGNOSIS — G932 Benign intracranial hypertension: Secondary | ICD-10-CM

## 2023-03-12 DIAGNOSIS — G43709 Chronic migraine without aura, not intractable, without status migrainosus: Secondary | ICD-10-CM | POA: Diagnosis not present

## 2023-03-12 MED ORDER — NORTRIPTYLINE HCL 10 MG PO CAPS
30.0000 mg | ORAL_CAPSULE | Freq: Every day | ORAL | 11 refills | Status: DC
  Filled 2023-03-12: qty 90, 30d supply, fill #0
  Filled 2023-04-04 – 2023-04-05 (×2): qty 90, 30d supply, fill #1
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
## 2023-03-12 NOTE — Telephone Encounter (Signed)
Referral for neurology fax to [facility] to Comprehensive Headache Program.

## 2023-03-12 NOTE — Patient Instructions (Signed)
Neurology - Comprehensive Headache Program [phone]

## 2023-03-13 ENCOUNTER — Ambulatory Visit (INDEPENDENT_AMBULATORY_CARE_PROVIDER_SITE_OTHER): Payer: Commercial Managed Care - PPO | Admitting: Family Medicine

## 2023-03-13 ENCOUNTER — Other Ambulatory Visit (HOSPITAL_BASED_OUTPATIENT_CLINIC_OR_DEPARTMENT_OTHER): Payer: Self-pay

## 2023-03-13 ENCOUNTER — Encounter: Payer: Self-pay | Admitting: Family Medicine

## 2023-03-13 VITALS — BP 110/76 | HR 72 | Temp 98.3°F | Ht 63.0 in | Wt 218.4 lb

## 2023-03-13 DIAGNOSIS — I10 Essential (primary) hypertension: Secondary | ICD-10-CM

## 2023-03-13 DIAGNOSIS — D2361 Other benign neoplasm of skin of right upper limb, including shoulder: Secondary | ICD-10-CM

## 2023-03-13 DIAGNOSIS — K219 Gastro-esophageal reflux disease without esophagitis: Secondary | ICD-10-CM

## 2023-03-13 DIAGNOSIS — Z0000 Encounter for general adult medical examination without abnormal findings: Secondary | ICD-10-CM

## 2023-03-13 DIAGNOSIS — Z0001 Encounter for general adult medical examination with abnormal findings: Secondary | ICD-10-CM | POA: Diagnosis not present

## 2023-03-13 DIAGNOSIS — R1900 Intra-abdominal and pelvic swelling, mass and lump, unspecified site: Secondary | ICD-10-CM

## 2023-03-13 DIAGNOSIS — D489 Neoplasm of uncertain behavior, unspecified: Secondary | ICD-10-CM

## 2023-03-13 LAB — CBC
HCT: 36.6 % (ref 36.0–46.0)
Hemoglobin: 11.4 g/dL — ABNORMAL LOW (ref 12.0–15.0)
MCHC: 31.1 g/dL (ref 30.0–36.0)
MCV: 89.1 fl (ref 78.0–100.0)
Platelets: 324 10*3/uL (ref 150.0–400.0)
RBC: 4.1 Mil/uL (ref 3.87–5.11)
RDW: 18.2 % — ABNORMAL HIGH (ref 11.5–15.5)
WBC: 6.7 10*3/uL (ref 4.0–10.5)

## 2023-03-13 LAB — LIPID PANEL
Cholesterol: 193 mg/dL (ref 0–200)
HDL: 40 mg/dL (ref >39.00)
LDL Cholesterol: 126 mg/dL — ABNORMAL HIGH (ref 0–99)
NonHDL: 153.47
Total CHOL/HDL Ratio: 5
Triglycerides: 139 mg/dL (ref 0.0–149.0)
VLDL: 27.8 mg/dL (ref 0.0–40.0)

## 2023-03-13 LAB — COMPREHENSIVE METABOLIC PANEL
ALT: 13 U/L (ref 0–35)
AST: 12 U/L (ref 0–37)
Albumin: 4 g/dL (ref 3.5–5.2)
Alkaline Phosphatase: 87 U/L (ref 39–117)
BUN: 15 mg/dL (ref 6–23)
CO2: 22 mEq/L (ref 19–32)
Calcium: 9 mg/dL (ref 8.4–10.5)
Chloride: 108 mEq/L (ref 96–112)
Creatinine, Ser: 1.03 mg/dL (ref 0.40–1.20)
GFR: 69.33 mL/min (ref >60.00)
Glucose, Bld: 100 mg/dL — ABNORMAL HIGH (ref 70–99)
Potassium: 4.1 mEq/L (ref 3.5–5.1)
Sodium: 138 mEq/L (ref 135–145)
Total Bilirubin: 0.3 mg/dL (ref 0.2–1.2)
Total Protein: 6.7 g/dL (ref 6.0–8.3)

## 2023-03-13 MED ORDER — AMLODIPINE BESYLATE 5 MG PO TABS
5.0000 mg | ORAL_TABLET | Freq: Every day | ORAL | 3 refills | Status: DC
Start: 2023-03-13 — End: 2023-12-05
  Filled 2023-03-13 – 2023-06-11 (×3): qty 90, 90d supply, fill #0
  Filled 2023-09-07: qty 90, 90d supply, fill #1

## 2023-03-13 NOTE — Addendum Note (Signed)
Addended by: Scharlene Gloss B on: 03/13/2023 10:51 AM   Modules accepted: Orders

## 2023-03-13 NOTE — Patient Instructions (Signed)
Give [place] 2-3 business days to get the results of your labs back.   Keep the diet clean and stay active.  I recommend getting the flu shot in mid October. This suggestion would change if the CDC comes out with a different recommendation.   Please get me a copy of your advanced directive form at your convenience.   Do not shower for the rest of the day. When you do wash it, use only soap and water. Do not vigorously scrub. Apply triple antibiotic ointment (like Neosporin) twice daily. Keep the area clean and dry.   Things to look out for: increasing pain not relieved by ibuprofen/acetaminophen, fevers, spreading redness, drainage of pus, or foul odor.  Give [place] 1 week to get the results of your biopsy back.  Let us know if you need anything.

## 2023-03-13 NOTE — Progress Notes (Addendum)
Chief Complaint  Patient presents with   Annual Exam     Well Woman Helen Wells is here for a complete physical.   Her last physical was >1 year ago.  Current diet: in general, diet is fair. Current exercise: none currently. Contraception? Yes Fatigue out of ordinary? No Seatbelt? Yes Advanced directive? No  Health Maintenance Pap/HPV- Yes Tetanus- Yes HIV screening- Yes Hep C screening- Yes  Patient has had a lesion on her right shoulder that has been there for 2 months.  No new lotions, soaps, topicals, or detergents.  She feels it has doubled in size over the past month.  She has a family history of melanoma.  She does wear sunscreen.  There is no pain, itching, or drainage.  Past Medical History:  Diagnosis Date   Anemia    Anemia    Anxiety    Clotting disorder (HCC)    Hyperlipemia    Hypertension    Hypertension      Past Surgical History:  Procedure Laterality Date   Gallbladder Removal 2017     lancinectomy and Discectomy 2021     Dr Orlie Pollen - Neurosurgeon    Medications  Current Outpatient Medications on File Prior to Visit  Medication Sig Dispense Refill   acetaZOLAMIDE (DIAMOX) 250 MG tablet Take 2 tablets (500 mg total) by mouth 2 (two) times daily. 120 tablet 11   amLODipine (NORVASC) 5 MG tablet Take 1 tablet (5 mg total) by mouth daily. 90 tablet 3   butalbital-acetaminophen-caffeine (FIORICET) 50-325-40 MG tablet Take 1 tablet by mouth every 6 (six) hours as needed for headache. 30 tablet 1   lisinopril (ZESTRIL) 20 MG tablet Take 1 tablet (20 mg total) by mouth daily. 90 tablet 3   nortriptyline (PAMELOR) 10 MG capsule Take 3 capsules (30 mg total) by mouth at bedtime. 90 capsule 11   omeprazole (PRILOSEC) 40 MG capsule Take 1 capsule (40 mg total) by mouth daily. 30 capsule 3    Allergies Allergies  Allergen Reactions   Bupivacaine-Meloxicam Er     Other reaction(s): Unknown   Erythromycin     Blind Other reaction(s): Other Pt  states it causes her to go blind Blind    Meloxicam     Stomach Ulcers Other reaction(s): Other (See Comments) ulcer Stomach Ulcers ulcer    Tetracyclines & Related Rash    Blind Other reaction(s): Other (See Comments) Informed by her MD not to take any Tetracyclines. Other reaction(s): Other Informed by her MD not to take any Tetracyclines. Informed by her MD not to take any Tetracyclines. Blind    Erythromycin Base     Other reaction(s): Other (See Comments) Other    Macrolides And Ketolides     Other reaction(s): Other (See Comments), Other (See Comments) Blindness, Pseudotumor Cerebri  Blindness, Pseudotumor Cerebri      Review of Systems: Constitutional:  no unexpected weight changes Eye:  no recent significant change in vision Ear/Nose/Mouth/Throat:  Ears:  no tinnitus or vertigo and no recent change in hearing Nose/Mouth/Throat:  no complaints of nasal congestion, no sore throat Cardiovascular: no chest pain Respiratory:  no cough and no shortness of breath Gastrointestinal:  no abdominal pain, no change in bowel habits GU:  Female: negative for dysuria or pelvic pain Musculoskeletal/Extremities:  no pain of the joints Integumentary (Skin/Breast): As noted in HPI Neurologic: + headaches (follows with urology) Endocrine:  denies fatigue Hematologic/Lymphatic:  No areas of easy bleeding  Exam BP 110/76 (BP Location: Left  Arm, Patient Position: Sitting, Cuff Size: Large)   Pulse 72   Temp 98.3 F (36.8 C) (Oral)   Ht 5\' 3"  (1.6 m)   Wt 218 lb 6 oz (99.1 kg)   SpO2 99%   BMI 38.68 kg/m  General:  well developed, well nourished, in no apparent distress Skin: On the right antero-lateral shoulder there is a slightly raised and slightly hyperpigmented lesion measuring 0.6 x 0.4 cm in diameter.  It is without TTP, scaling, excoriation erythema, fluctuance, or drainage.  Otherwise, no significant moles, warts, or growths Head:  no masses, lesions, or  tenderness Eyes:  pupils equal and round, sclera anicteric without injection Ears:  canals without lesions, TMs shiny without retraction, no obvious effusion, no erythema Nose:  nares patent, mucosa normal, and no drainage  Throat/Pharynx:  lips and gingiva without lesion; tongue and uvula midline; non-inflamed pharynx; no exudates or postnasal drainage Neck: neck supple without adenopathy, thyromegaly, or masses Lungs:  clear to auscultation, breath sounds equal bilaterally, no respiratory distress Cardio:  regular rate and rhythm, no bruits, no LE edema Abdomen:  abdomen soft, nontender; bowel sounds normal; no masses or organomegaly Genital: Defer to GYN Musculoskeletal:  symmetrical muscle groups noted without atrophy or deformity Extremities:  no clubbing, cyanosis, or edema, no deformities, no skin discoloration Neuro:  gait normal; deep tendon reflexes normal and symmetric Psych: well oriented with normal range of affect and appropriate judgment/insight  Procedure note; shave biopsy Informed consent was obtained. The area was cleaned with alcohol and injected with 1 mL of 1% lidocaine with epinephrine. A Dermablade was slightly bent and used to cut under the area of interest. The specimen was placed in a sterile specimen cup and sent to the lab. The area was then cauterized ensuring adequate hemostasis. The area was dressed with triple antibiotic ointment and a bandage. There were no complications noted. The patient tolerated the procedure well.   Assessment and Plan  Well adult exam - Plan: CBC, Comprehensive metabolic panel, Lipid panel  Neoplasm of uncertain behavior - Plan: PR SHVG SKIN LESION 1 TRUNK/ARM/LEG DIAM 0.6-1.0 CM  Primary hypertension - Plan: amLODipine (NORVASC) 5 MG tablet  Gastroesophageal reflux disease, unspecified whether esophagitis present  Retroperitoneal mass - Plan: Ambulatory referral to Urology   Well 37 y.o. female. Counseled on diet and  exercise. Advanced directive form requested today.  Skin lesion: Shave biopsy today.  Aftercare instructions verbalized and written down. Other orders as above. Follow up in 6 mo. The patient voiced understanding and agreement to the plan.  [name] [place], DO 03/13/23 10:39 AM

## 2023-03-14 ENCOUNTER — Other Ambulatory Visit: Payer: Self-pay | Admitting: Family Medicine

## 2023-03-14 ENCOUNTER — Ambulatory Visit (INDEPENDENT_AMBULATORY_CARE_PROVIDER_SITE_OTHER): Payer: Commercial Managed Care - PPO

## 2023-03-14 DIAGNOSIS — R739 Hyperglycemia, unspecified: Secondary | ICD-10-CM

## 2023-03-14 DIAGNOSIS — D649 Anemia, unspecified: Secondary | ICD-10-CM

## 2023-03-14 LAB — IBC + FERRITIN
Ferritin: 6.2 ng/mL — ABNORMAL LOW (ref 10.0–291.0)
Iron: 50 ug/dL (ref 42–145)
Saturation Ratios: 10.8 % — ABNORMAL LOW (ref 20.0–50.0)
TIBC: 464.8 ug/dL — ABNORMAL HIGH (ref 250.0–450.0)
Transferrin: 332 mg/dL (ref 212.0–360.0)

## 2023-03-14 LAB — HEMOGLOBIN A1C: Hgb A1c MFr Bld: 5.7 % (ref 4.6–6.5)

## 2023-03-26 ENCOUNTER — Encounter: Payer: Self-pay | Admitting: Urology

## 2023-03-26 ENCOUNTER — Encounter: Payer: Self-pay | Admitting: Neurology

## 2023-03-26 ENCOUNTER — Ambulatory Visit (INDEPENDENT_AMBULATORY_CARE_PROVIDER_SITE_OTHER): Payer: Commercial Managed Care - PPO | Admitting: Urology

## 2023-03-26 VITALS — BP 118/73 | HR 89 | Ht 63.0 in | Wt 218.0 lb

## 2023-03-26 DIAGNOSIS — N2889 Other specified disorders of kidney and ureter: Secondary | ICD-10-CM | POA: Diagnosis not present

## 2023-03-26 DIAGNOSIS — R1900 Intra-abdominal and pelvic swelling, mass and lump, unspecified site: Secondary | ICD-10-CM | POA: Diagnosis not present

## 2023-03-26 LAB — URINALYSIS, ROUTINE W REFLEX MICROSCOPIC
Bilirubin, UA: NEGATIVE
Glucose, UA: NEGATIVE
Ketones, UA: NEGATIVE
Leukocytes,UA: NEGATIVE
Nitrite, UA: NEGATIVE
Protein,UA: NEGATIVE
Specific Gravity, UA: <1.005 — ABNORMAL LOW (ref 1.005–1.030)
Urobilinogen, Ur: 0.2 mg/dL (ref 0.2–1.0)
pH, UA: 6 (ref 5.0–7.5)

## 2023-03-26 LAB — MICROSCOPIC EXAMINATION

## 2023-03-26 NOTE — Progress Notes (Signed)
Assessment: 1. 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  We discussed the issues and implications of AML especially risk of spontaneous hemorrhage.  The patient states that she is inclined to proceed with treatment and we discussed surgical as well as embolization as viable options.  At this time I have recommended follow-up imaging-will obtain repeat MRI of abdomen.  Patient will follow-up after above.  Chief Complaint: Second opinion for her left renal/retroperitoneal mass  History of Present Illness:  Helen Wells is a 37 y.o. 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  Patient reports that she is here today for a second opinion.  She saw Dr. Berneice Heinrich at [facility] in May 2024.  I have reviewed those records.  In summary the patient had a CT of the spine in March 2024 that showed an incidental lesion suspicious for a renal/retroperitoneal AML.  She had a subsequent follow-up MRI 10/07/2022 which I have reviewed today-see report below.  In retrospect this lesion has been there since January 2022 when it was noted on retrospective review on a CT angio.  There has been little change in size of the lesion since that time.  The most recent measurements were 4.7 cm.  The patient has 2 children and is not planning on any additional pregnancy. Her past medical history is significant for obesity, intracranial hypertension requiring spinal taps, left spine surgery, lap chole.    MRI 10/07/2022-- IMPRESSION: 1. Macroscopic fat containing 4.7 cm left retroperitoneal lesion which either arises from or focally effaces the upper pole kidney and demonstrates internal areas of enhancement as well as areas of signal loss on out of phase imaging. 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 However given there is some question regarding its definitive extension from the kidney a retroperitoneal liposarcoma is a pertinent differential consideration which warrants further attention/surveillance. Additionally if this is in fact an AML it would warrant continued surveillance and consideration for embolization/surgical removal given that a lesion of this size has increased risk of spontaneous hemorrhage. Recommend surgical consult and follow up pre and post-contrast MRI in 6 months to assess stability. 2. Enhancing 2.1 cm right adrenal nodule is consistent with a benign adenoma, this requires no independent imaging follow-up.  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 Other reaction(s):  Other Informed by her MD not to take any Tetracyclines. Informed by her MD not to take any Tetracyclines. Blind    Erythromycin Base     Other reaction(s): Other (See Comments) Other    Macrolides And Ketolides     Other reaction(s): Other (See Comments), Other (See Comments) Blindness, Pseudotumor Cerebri  Blindness,  Pseudotumor Cerebri      Family History:  Family History  Problem Relation Age of Onset   Hypertension Mother    Arthritis Mother    High blood pressure Mother    Hypertension Father    Birth defects Father    High Cholesterol Father    Learning disabilities Father    Alzheimer's disease Son     Social History:  Social History   Tobacco Use   Smoking status: Never   Smokeless tobacco: Never  Vaping Use   Vaping status: Never Used  Substance Use Topics   Alcohol use: Not Currently    Alcohol/week: 1.0 standard drink of alcohol    Types: 1 Shots of liquor per week   Drug use: Never    Review of symptoms:  Constitutional:  Negative for unexplained weight loss, night sweats, fever, chills ENT:  Negative for nose bleeds, sinus pain, painful swallowing CV:  Negative for chest pain, shortness of breath, exercise intolerance, palpitations, loss of consciousness Resp:  Negative for cough, wheezing, shortness of breath GI:  Negative for nausea, vomiting, diarrhea, bloody stools GU:  Positives noted in HPI; otherwise negative for gross hematuria, dysuria, urinary incontinence Neuro:  Negative for seizures, poor balance, limb weakness, slurred speech Psych:  Negative for lack of energy, depression, anxiety Endocrine:  Negative for polydipsia, polyuria, symptoms of hypoglycemia (dizziness, hunger, sweating) Hematologic:  Negative for anemia, purpura, petechia, prolonged or excessive bleeding, use of anticoagulants  Allergic:  Negative for difficulty breathing or choking as a result of exposure to anything; no shellfish allergy; no allergic response (rash/itch) to materials, foods  Physical exam: BP 118/73   Pulse 89   Ht 5\' 3"  (1.6 m)   Wt 218 lb (98.9 kg)   BMI 38.62 kg/m  GENERAL APPEARANCE:  Well appearing, well developed, well nourished, NAD

## 2023-04-03 ENCOUNTER — Other Ambulatory Visit (HOSPITAL_BASED_OUTPATIENT_CLINIC_OR_DEPARTMENT_OTHER): Payer: Self-pay

## 2023-04-04 ENCOUNTER — Other Ambulatory Visit (HOSPITAL_BASED_OUTPATIENT_CLINIC_OR_DEPARTMENT_OTHER): Payer: Self-pay

## 2023-04-08 ENCOUNTER — Ambulatory Visit (HOSPITAL_BASED_OUTPATIENT_CLINIC_OR_DEPARTMENT_OTHER)
Admission: RE | Admit: 2023-04-08 | Discharge: 2023-04-08 | Disposition: A | Discharge status: Home / Self Care | Payer: Commercial Managed Care - PPO | Source: Ambulatory Visit | Attending: Urology | Admitting: Urology

## 2023-04-08 DIAGNOSIS — R1900 Intra-abdominal and pelvic swelling, mass and lump, unspecified site: Secondary | ICD-10-CM | POA: Diagnosis not present

## 2023-04-08 DIAGNOSIS — N2889 Other specified disorders of kidney and ureter: Secondary | ICD-10-CM | POA: Diagnosis not present

## 2023-04-08 DIAGNOSIS — N289 Disorder of kidney and ureter, unspecified: Secondary | ICD-10-CM | POA: Diagnosis not present

## 2023-04-08 DIAGNOSIS — D1771 Benign lipomatous neoplasm of kidney: Secondary | ICD-10-CM | POA: Diagnosis not present

## 2023-04-08 DIAGNOSIS — Z9049 Acquired absence of other specified parts of digestive tract: Secondary | ICD-10-CM | POA: Diagnosis not present

## 2023-04-08 MED ORDER — GADOBUTROL 1 MMOL/ML IV SOLN
7.0000 mL | Freq: Once | INTRAVENOUS | Status: AC | PRN
  Administered 2023-04-08: 7 mL via INTRAVENOUS

## 2023-04-16 ENCOUNTER — Other Ambulatory Visit (HOSPITAL_BASED_OUTPATIENT_CLINIC_OR_DEPARTMENT_OTHER): Payer: Self-pay

## 2023-04-19 ENCOUNTER — Other Ambulatory Visit (INDEPENDENT_AMBULATORY_CARE_PROVIDER_SITE_OTHER): Payer: Commercial Managed Care - PPO

## 2023-04-19 DIAGNOSIS — D649 Anemia, unspecified: Secondary | ICD-10-CM | POA: Diagnosis not present

## 2023-04-19 LAB — IBC + FERRITIN
Ferritin: 21.5 ng/mL (ref 10.0–291.0)
Iron: 109 ug/dL (ref 42–145)
Saturation Ratios: 25.7 % (ref 20.0–50.0)
TIBC: 424.2 ug/dL (ref 250.0–450.0)
Transferrin: 303 mg/dL (ref 212.0–360.0)

## 2023-04-19 LAB — CBC
HCT: 39.7 % (ref 36.0–46.0)
Hemoglobin: 12.7 g/dL (ref 12.0–15.0)
MCHC: 31.9 g/dL (ref 30.0–36.0)
MCV: 93.6 fL (ref 78.0–100.0)
Platelets: 287 10*3/uL (ref 150.0–400.0)
RBC: 4.25 Mil/uL (ref 3.87–5.11)
RDW: 21.2 % — ABNORMAL HIGH (ref 11.5–15.5)
WBC: 5.9 10*3/uL (ref 4.0–10.5)

## 2023-04-20 ENCOUNTER — Encounter: Payer: Self-pay | Admitting: Family Medicine

## 2023-04-20 ENCOUNTER — Ambulatory Visit: Payer: Commercial Managed Care - PPO | Admitting: Family Medicine

## 2023-04-20 ENCOUNTER — Other Ambulatory Visit (HOSPITAL_BASED_OUTPATIENT_CLINIC_OR_DEPARTMENT_OTHER): Payer: Self-pay

## 2023-04-20 VITALS — BP 108/62 | HR 86 | Temp 98.5°F | Ht 63.0 in | Wt 214.2 lb

## 2023-04-20 DIAGNOSIS — G43709 Chronic migraine without aura, not intractable, without status migrainosus: Secondary | ICD-10-CM

## 2023-04-20 DIAGNOSIS — R5383 Other fatigue: Secondary | ICD-10-CM

## 2023-04-20 MED ORDER — AMITRIPTYLINE HCL 25 MG PO TABS
25.0000 mg | ORAL_TABLET | Freq: Every day | ORAL | 1 refills | Status: DC
  Filled 2023-04-20: qty 30, 30d supply, fill #0
  Filled 2023-05-16: qty 30, 30d supply, fill #1

## 2023-04-20 NOTE — Patient Instructions (Signed)
Stay on the iron.  Continue to monitor your blood pressure.   Let us know if you need anything.

## 2023-04-20 NOTE — Progress Notes (Signed)
Chief Complaint  Patient presents with   Fatigue    Subjective: Patient is a 37 y.o. female here for fatigue.  This has been going on for the past 12 days.  She has been very tired, lightheaded, and sometimes short of breath.  This feels similar to when she had too much blood pressure medicine in the past.  She has been compliant with lisinopril 20 mg daily and amlodipine 5 mg daily.  She is been checking her blood pressure at home and has been fluctuating between slightly low in the low 100s and high in the 140s.  Both fluctuations do not appear to affect/correlate with her symptoms.  She is eating and drinking normally.  She was recently placed on Pamelor and has been steadily uptitrating.  When she increased from 20 mg nightly to 30 mg nightly, she noticed an improvement in her headaches but started having the symptoms.  She was on amitriptyline 25 mg nightly in the past and did very well with this.  Past Medical History:  Diagnosis Date   Anemia    Anemia    Anxiety    Clotting disorder (HCC)    Hyperlipemia    Hypertension    Hypertension     Objective: BP 108/62 (BP Location: Left Arm, Patient Position: Sitting, Cuff Size: Large)   Pulse 86   Temp 98.5 F (36.9 C) (Oral)   Ht 5\' 3"  (1.6 m)   Wt 214 lb 4 oz (97.2 kg)   SpO2 99%   BMI 37.95 kg/m  General: Awake, appears stated age Heart: RRR, no LE edema, no bruits Mouth: MMM Lungs: CTAB, no rales, wheezes or rhonchi. 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  Stop Pamelor, start amitriptyline 25 mg nightly.  She will monitor her blood pressures at home and stay hydrated.  She is having some hair thinning but her ferritin is less than 50 so she will continue iron supplementation.  Her hemoglobin is back to normal so  doubt the fatigue is related to that directly. Tentatively follow up in 1 mo to reck.  The patient voiced understanding and agreement to the plan.  [name] [place], DO 04/20/23  4:03 PM

## 2023-04-26 ENCOUNTER — Other Ambulatory Visit: Payer: Self-pay | Admitting: Medical Genetics

## 2023-04-26 DIAGNOSIS — Z006 Encounter for examination for normal comparison and control in clinical research program: Secondary | ICD-10-CM

## 2023-05-03 ENCOUNTER — Ambulatory Visit (INDEPENDENT_AMBULATORY_CARE_PROVIDER_SITE_OTHER): Payer: Commercial Managed Care - PPO | Admitting: Urology

## 2023-05-03 ENCOUNTER — Encounter: Payer: Self-pay | Admitting: Urology

## 2023-05-03 VITALS — BP 117/83 | HR 82 | Ht 63.0 in | Wt 217.0 lb

## 2023-05-03 DIAGNOSIS — R1900 Intra-abdominal and pelvic swelling, mass and lump, unspecified site: Secondary | ICD-10-CM | POA: Diagnosis not present

## 2023-05-03 NOTE — Progress Notes (Addendum)
   Assessment: 1. Retroperitoneal mass     Plan: Today I had a long discussion with the patient regarding her left retroperitoneal mass question left renal AML.  I told her my opinion is given the uncertainty regarding the histology that I would favor surgical resection.  I discussed her case today with Dr. Berneice Heinrich at [facility] urology who is also seen her previously.  She is scheduled to see him 05/28/2023 for follow-up and consideration of surgical removal.    Total time =41min  Chief Complaint: Renal mass  HPI: Helen Wells is a 37 y.o. female who presents for continued evaluation of a left probable renal AML. I reviewed her recent MRI in great detail and discussed the findings with the patient. The mass barely indents the lower pole of the left kidney and raises the question of alternative pathology including potentially a liposarcoma.  It does appear to have enlarged further in the last 6 months.  It measured 4.7 cm in maximal dimension last spring and currently measures 5.4 cm.  The patient is very clear that she wants to move forward with definitive treatment.   Portions of the above documentation were copied from a prior visit for review purposes only.  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 Other reaction(s): Other Informed by her MD not to take any Tetracyclines. Informed by her MD not to take any Tetracyclines. Blind    Erythromycin Base     Other reaction(s): Other (See Comments) Other    Macrolides And Ketolides     Other reaction(s): Other (See Comments), Other (See  Comments) Blindness, Pseudotumor Cerebri  Blindness, Pseudotumor Cerebri      PMH: Past Medical History:  Diagnosis Date   Anemia    Anemia    Anxiety    Clotting disorder (HCC)    Hyperlipemia    Hypertension    Hypertension     PSH: Past Surgical History:  Procedure Laterality Date   Gallbladder Removal 2017     lancinectomy and Discectomy 2021     Dr Orlie Pollen - Neurosurgeon    SH: Social History   Tobacco Use   Smoking status: Never   Smokeless tobacco: Never  Vaping Use   Vaping status: Never Used  Substance Use Topics   Alcohol use: Not Currently    Alcohol/week: 1.0 standard drink of alcohol    Types: 1 Shots of liquor per week   Drug use: Never    ROS: Constitutional:  Negative for fever, chills, weight loss CV: Negative for chest pain, previous MI, hypertension Respiratory:  Negative for shortness of breath, wheezing, sleep apnea, frequent cough GI:  Negative for nausea, vomiting, bloody stool, GERD  PE: BP 117/83   Pulse 82   Ht 5\' 3"  (1.6 m)   Wt 217 lb (98.4 kg)   BMI 38.44 kg/m  GENERAL APPEARANCE:  Well appearing, well developed, well nourished, NAD

## 2023-05-03 NOTE — Addendum Note (Signed)
Addended by: Marcha Solders C on: 05/03/2023 12:59 PM   Modules accepted: Orders

## 2023-05-09 LAB — URINALYSIS, ROUTINE W REFLEX MICROSCOPIC
Bilirubin, UA: NEGATIVE
Glucose, UA: NEGATIVE
Ketones, UA: NEGATIVE
Leukocytes,UA: NEGATIVE
Nitrite, UA: NEGATIVE
Protein,UA: NEGATIVE
RBC, UA: NEGATIVE
Specific Gravity, UA: 1.02 (ref 1.005–1.030)
Urobilinogen, Ur: 0.2 mg/dL (ref 0.2–1.0)
pH, UA: 7.5 (ref 5.0–7.5)

## 2023-05-14 ENCOUNTER — Ambulatory Visit: Payer: Commercial Managed Care - PPO | Admitting: Family Medicine

## 2023-05-14 ENCOUNTER — Other Ambulatory Visit (HOSPITAL_BASED_OUTPATIENT_CLINIC_OR_DEPARTMENT_OTHER): Payer: Self-pay

## 2023-05-14 ENCOUNTER — Encounter: Payer: Self-pay | Admitting: Family Medicine

## 2023-05-14 VITALS — BP 122/72 | HR 103 | Temp 98.0°F | Resp 16 | Ht 63.0 in | Wt 213.0 lb

## 2023-05-14 DIAGNOSIS — J189 Pneumonia, unspecified organism: Secondary | ICD-10-CM

## 2023-05-14 MED ORDER — FLUCONAZOLE 150 MG PO TABS
ORAL_TABLET | ORAL | 0 refills | Status: DC
Start: 2023-05-14 — End: 2023-07-12
  Filled 2023-05-14: qty 2, 3d supply, fill #0

## 2023-05-14 MED ORDER — BENZONATATE 200 MG PO CAPS
200.0000 mg | ORAL_CAPSULE | Freq: Two times a day (BID) | ORAL | 0 refills | Status: DC | PRN
Start: 2023-05-14 — End: 2023-07-12
  Filled 2023-05-14: qty 20, 10d supply, fill #0

## 2023-05-14 MED ORDER — AMOXICILLIN-POT CLAVULANATE 875-125 MG PO TABS
1.0000 | ORAL_TABLET | Freq: Two times a day (BID) | ORAL | 0 refills | Status: AC
Start: 2023-05-14 — End: 2023-05-21
  Filled 2023-05-14: qty 14, 7d supply, fill #0

## 2023-05-14 NOTE — Patient Instructions (Signed)
Continue to push fluids, practice good hand hygiene, and cover your mouth if you cough. ? ?If you start having fevers, shaking or shortness of breath, seek immediate care. ? ?OK to take Tylenol 1000 mg (2 extra strength tabs) or 975 mg (3 regular strength tabs) every 6 hours as needed. ? ?Let us know if you need anything. ?

## 2023-05-14 NOTE — Progress Notes (Signed)
Chief Complaint  Patient presents with   Cough    Cough and congestion    Helen Wells here for URI complaints.  Duration: 3 weeks; got worse 3 d ago.  Associated symptoms: sinus congestion, chest tightness, and a dry cough Denies: sinus pain, rhinorrhea, itchy watery eyes, ear pain, ear drainage, sore throat, wheezing, shortness of breath, myalgia, and fevers Treatment to date: tea w honey/lemon, coughing Sick contacts: No  Past Medical History:  Diagnosis Date   Anemia    Anemia    Anxiety    Clotting disorder (HCC)    Hyperlipemia    Hypertension    Hypertension     Objective BP 122/72 (BP Location: Left Arm, Patient Position: Sitting, Cuff Size: Normal)   Pulse (!) 103   Temp 98 F (36.7 C) (Oral)   Resp 16   Ht 5\' 3"  (1.6 m)   Wt 213 lb (96.6 kg)   SpO2 98%   BMI 37.73 kg/m  General: Awake, alert, appears stated age HEENT: AT, [place], ears patent b/l and TM's neg, nares patent w/o discharge, pharynx pink and without exudates, MMM, no sinus ttp Neck: No masses or asymmetry Heart: RRR Lungs: CTAB, no accessory muscle use Psych: Age appropriate judgment and insight, normal mood and affect  Walking pneumonia - Plan: benzonatate (TESSALON) 200 MG capsule, fluconazole (DIFLUCAN) 150 MG tablet, amoxicillin-clavulanate (AUGMENTIN) 875-125 MG tablet  Continue to push fluids, practice good hand hygiene, cover mouth when coughing. F/u prn. If starting to experience fevers, shaking, or shortness of breath, seek immediate care. Pt voiced understanding and agreement to the plan.  [name] [place], DO 05/14/23 2:04 PM

## 2023-05-28 DIAGNOSIS — D1771 Benign lipomatous neoplasm of kidney: Secondary | ICD-10-CM | POA: Diagnosis not present

## 2023-05-28 DIAGNOSIS — D3501 Benign neoplasm of right adrenal gland: Secondary | ICD-10-CM | POA: Diagnosis not present

## 2023-06-01 ENCOUNTER — Other Ambulatory Visit: Payer: Self-pay | Admitting: Urology

## 2023-06-04 ENCOUNTER — Other Ambulatory Visit (HOSPITAL_BASED_OUTPATIENT_CLINIC_OR_DEPARTMENT_OTHER): Payer: Self-pay

## 2023-06-04 ENCOUNTER — Ambulatory Visit: Payer: Commercial Managed Care - PPO | Admitting: Family Medicine

## 2023-06-04 ENCOUNTER — Encounter: Payer: Self-pay | Admitting: Family Medicine

## 2023-06-04 VITALS — BP 122/77 | HR 84 | Temp 98.0°F | Ht 63.0 in | Wt 219.0 lb

## 2023-06-04 DIAGNOSIS — J189 Pneumonia, unspecified organism: Secondary | ICD-10-CM

## 2023-06-04 MED ORDER — ALBUTEROL SULFATE HFA 108 (90 BASE) MCG/ACT IN AERS
2.0000 | INHALATION_SPRAY | Freq: Four times a day (QID) | RESPIRATORY_TRACT | 0 refills | Status: DC | PRN
  Filled 2023-06-04: qty 6.7, 25d supply, fill #0

## 2023-06-04 MED ORDER — LEVOFLOXACIN 750 MG PO TABS
750.0000 mg | ORAL_TABLET | Freq: Every day | ORAL | 0 refills | Status: AC
  Filled 2023-06-04: qty 5, 5d supply, fill #0

## 2023-06-04 NOTE — Progress Notes (Signed)
Acute Office Visit  Subjective:     Patient ID: Helen Wells, female    DOB: 01-28-1986, 37 y.o.   MRN: [id number]  Chief Complaint  Patient presents with   Cough   Headache     Patient is in today for cough.   Discussed the use of AI scribe software for clinical note transcription with the patient, who gave verbal consent to proceed.  History of Present Illness   The patient presents with a recurrence of symptoms similar to a recent episode of walking pneumonia treated with Augmentin. Approximately two weeks after completing the antibiotic course, the patient began to experience fatigue, a dry cough, and chest discomfort. The chest discomfort is described as a tightness/congestion, exacerbated by deep breaths which also trigger coughing. The cough is notably worse at night and upon waking, to the point of disrupting sleep. The patient denies any productive cough, nasal drainage, or dyspnea at rest.  She has been experiencing these symptoms for about a week. The patient's previous episode of pneumonia was treated with Augmentin due to multiple antibiotic allergies, including macrolides and tetracyclines.  The patient works in a daycare setting and is due to travel out of the country in the coming week. She expresses concern about her symptoms worsening, as she did during the initial episode of pneumonia, and the desire to avoid seeking medical care while abroad.             All review of systems negative except what is listed in the HPI      Objective:    BP 122/77   Pulse 84   Temp 98 F (36.7 C) (Oral)   Ht 5\' 3"  (1.6 m)   Wt 219 lb (99.3 kg)   SpO2 100%   BMI 38.79 kg/m    Physical Exam Vitals reviewed.  Constitutional:      Appearance: Normal appearance.  Cardiovascular:     Rate and Rhythm: Normal rate and regular rhythm.     Heart sounds: Normal heart sounds.  Pulmonary:     Effort: Pulmonary effort is normal.     Breath sounds: Normal  breath sounds. No wheezing, rhonchi or rales.  Skin:    General: Skin is warm and dry.  Neurological:     Mental Status: She is alert and oriented to person, place, and time.  Psychiatric:        Mood and Affect: Mood normal.        Behavior: Behavior normal.        Thought Content: Thought content normal.        Judgment: Judgment normal.          No results found for any visits on 06/04/23.      Assessment & Plan:   Problem List Items Addressed This Visit   None Visit Diagnoses     Walking pneumonia    -  Primary   Relevant Medications   levofloxacin (LEVAQUIN) 750 MG tablet   albuterol (VENTOLIN HFA) 108 (90 Base) MCG/ACT inhaler      Failed improvement with Augmentin.  Adding Levaquin due to contraindications for other antibiotics Adding albuterol for dyspnea, wheezing Continue supportive measures including rest, hydration, humidifier use, steam showers, warm compresses to sinuses, warm liquids with lemon and honey, and over-the-counter cough, cold, and analgesics as needed.  Discussed with PCP and pharmacist.      Meds ordered this encounter  Medications   levofloxacin (LEVAQUIN) 750 MG tablet    Sig:  Take 1 tablet (750 mg total) by mouth daily for 5 days.    Dispense:  5 tablet    Refill:  0    Order Specific Question:   Supervising Provider    Answer:   [name] Edge A [[id number]]   albuterol (VENTOLIN HFA) 108 (90 Base) MCG/ACT inhaler    Sig: Inhale 2 puffs into the lungs every 6 (six) hours as needed for wheezing or shortness of breath.    Dispense:  8 g    Refill:  0    Order Specific Question:   Supervising Provider    Answer:   [name] Edge A [[id number]]    Return if symptoms worsen or fail to improve.  Clayborne Dana, NP

## 2023-06-05 ENCOUNTER — Other Ambulatory Visit (HOSPITAL_BASED_OUTPATIENT_CLINIC_OR_DEPARTMENT_OTHER): Payer: Self-pay

## 2023-06-08 ENCOUNTER — Other Ambulatory Visit: Payer: Self-pay

## 2023-06-11 ENCOUNTER — Other Ambulatory Visit: Payer: Self-pay | Admitting: Family Medicine

## 2023-06-11 ENCOUNTER — Other Ambulatory Visit: Payer: Self-pay

## 2023-06-11 ENCOUNTER — Other Ambulatory Visit (HOSPITAL_BASED_OUTPATIENT_CLINIC_OR_DEPARTMENT_OTHER): Payer: Self-pay

## 2023-06-11 MED ORDER — AMITRIPTYLINE HCL 25 MG PO TABS
25.0000 mg | ORAL_TABLET | Freq: Every day | ORAL | 2 refills | Status: DC
  Filled 2023-06-11: qty 90, 90d supply, fill #0
  Filled 2023-09-13: qty 90, 90d supply, fill #1
  Filled 2023-12-10: qty 90, 90d supply, fill #2

## 2023-06-25 ENCOUNTER — Other Ambulatory Visit: Payer: Self-pay | Admitting: Family Medicine

## 2023-06-25 DIAGNOSIS — J189 Pneumonia, unspecified organism: Secondary | ICD-10-CM

## 2023-06-26 ENCOUNTER — Encounter: Payer: Commercial Managed Care - PPO | Admitting: Family Medicine

## 2023-06-27 ENCOUNTER — Other Ambulatory Visit (HOSPITAL_BASED_OUTPATIENT_CLINIC_OR_DEPARTMENT_OTHER): Payer: Self-pay

## 2023-07-06 ENCOUNTER — Other Ambulatory Visit: Payer: Self-pay | Admitting: Urology

## 2023-07-12 ENCOUNTER — Encounter: Payer: Self-pay | Admitting: Family Medicine

## 2023-07-12 ENCOUNTER — Ambulatory Visit: Payer: Commercial Managed Care - PPO | Admitting: Family Medicine

## 2023-07-12 ENCOUNTER — Other Ambulatory Visit (HOSPITAL_BASED_OUTPATIENT_CLINIC_OR_DEPARTMENT_OTHER): Payer: Self-pay

## 2023-07-12 VITALS — BP 98/67 | HR 80 | Ht 63.0 in | Wt 217.0 lb

## 2023-07-12 DIAGNOSIS — R197 Diarrhea, unspecified: Secondary | ICD-10-CM

## 2023-07-12 DIAGNOSIS — Z789 Other specified health status: Secondary | ICD-10-CM | POA: Diagnosis not present

## 2023-07-12 LAB — COMPREHENSIVE METABOLIC PANEL
ALT: 28 U/L (ref 0–35)
AST: 15 U/L (ref 0–37)
Albumin: 3.9 g/dL (ref 3.5–5.2)
Alkaline Phosphatase: 104 U/L (ref 39–117)
BUN: 15 mg/dL (ref 6–23)
CO2: 20 meq/L (ref 19–32)
Calcium: 8.8 mg/dL (ref 8.4–10.5)
Chloride: 110 meq/L (ref 96–112)
Creatinine, Ser: 1.07 mg/dL (ref 0.40–1.20)
GFR: 66.08 mL/min (ref >60.00)
Glucose, Bld: 87 mg/dL (ref 70–99)
Potassium: 4.1 meq/L (ref 3.5–5.1)
Sodium: 137 meq/L (ref 135–145)
Total Bilirubin: 0.3 mg/dL (ref 0.2–1.2)
Total Protein: 6.6 g/dL (ref 6.0–8.3)

## 2023-07-12 LAB — CBC WITH DIFFERENTIAL/PLATELET
Basophils Absolute: 0 10*3/uL (ref 0.0–0.1)
Basophils Relative: 0.3 % (ref 0.0–3.0)
Eosinophils Absolute: 0.2 10*3/uL (ref 0.0–0.7)
Eosinophils Relative: 2.2 % (ref 0.0–5.0)
HCT: 39.5 % (ref 36.0–46.0)
Hemoglobin: 13.1 g/dL (ref 12.0–15.0)
Lymphocytes Relative: 23.7 % (ref 12.0–46.0)
Lymphs Abs: 2.1 10*3/uL (ref 0.7–4.0)
MCHC: 33.1 g/dL (ref 30.0–36.0)
MCV: 100.3 fL — ABNORMAL HIGH (ref 78.0–100.0)
Monocytes Absolute: 0.8 10*3/uL (ref 0.1–1.0)
Monocytes Relative: 9.4 % (ref 3.0–12.0)
Neutro Abs: 5.7 10*3/uL (ref 1.4–7.7)
Neutrophils Relative %: 64.4 % (ref 43.0–77.0)
Platelets: 327 10*3/uL (ref 150.0–400.0)
RBC: 3.94 Mil/uL (ref 3.87–5.11)
RDW: 17 % — ABNORMAL HIGH (ref 11.5–15.5)
WBC: 8.9 10*3/uL (ref 4.0–10.5)

## 2023-07-12 MED ORDER — RIFAXIMIN 200 MG PO TABS
200.0000 mg | ORAL_TABLET | Freq: Three times a day (TID) | ORAL | 0 refills | Status: AC
  Filled 2023-07-12: qty 9, 3d supply, fill #0

## 2023-07-12 NOTE — Progress Notes (Signed)
Acute Office Visit  Subjective:     Patient ID: Helen Wells, female    DOB: 10/14/1985, 37 y.o.   MRN: [id number]  Chief Complaint  Patient presents with   Diarrhea    Patient is in today for diarrhea.   Discussed the use of AI scribe software for clinical note transcription with the patient, who gave verbal consent to proceed.  History of Present Illness   The patient, with a recent history of walking pneumonia, traveled to [place] approximately two weeks ago. About a week into the trip, they began experiencing diarrhea, which they suspect may be due to contaminated water consumption. While others who also fell ill recovered within a few days, the patient's diarrhea has persisted and worsened, with up to fifteen to sixteen episodes daily. The stool is described as watery/brown, with no texture or smell, and no blood. The patient also reports increasing abdominal cramping.  The patient has a dietary restriction of being gluten-free, which was not strictly adhered to during the trip due to limited food choices. They initially attributed the diarrhea to gluten consumption, but the symptoms have continued even after six days of abstaining from gluten.  The patient has been managing to eat and drink, including electrolyte packs, despite feeling unwell. They have not experienced vomiting, but have had intermittent nausea. They have not had a fever.  The patient has a history of living in [place] for three years, during which they experienced similar symptoms. However, they do not recall the treatment received at that time. They have been taking over-the-counter Imodium without any improvement in symptoms.             All review of systems negative except what is listed in the HPI      Objective:    BP 98/67   Pulse 80   Ht 5\' 3"  (1.6 m)   Wt 217 lb (98.4 kg)   SpO2 100%   BMI 38.44 kg/m    Physical Exam Vitals reviewed.  Constitutional:      Appearance:  Normal appearance.  Cardiovascular:     Rate and Rhythm: Normal rate and regular rhythm.  Pulmonary:     Effort: Pulmonary effort is normal.     Breath sounds: Normal breath sounds.  Abdominal:     General: Abdomen is flat. Bowel sounds are normal. There is no distension.     Palpations: There is no mass.     Tenderness: There is no abdominal tenderness. There is no guarding or rebound.  Skin:    General: Skin is warm and dry.  Neurological:     Mental Status: She is alert and oriented to person, place, and time.  Psychiatric:        Mood and Affect: Mood normal.        Behavior: Behavior normal.        Thought Content: Thought content normal.        Judgment: Judgment normal.     No results found for any visits on 07/12/23.      Assessment & Plan:   Problem List Items Addressed This Visit   None Visit Diagnoses       Diarrhea, unspecified type    -  Primary   Relevant Medications   rifaximin (XIFAXAN) 200 MG tablet   Other Relevant Orders   CBC with Differential/Platelet   Stool culture   Clostridium Difficile by PCR   Hepatitis A antibody, IgM   Comprehensive metabolic panel  Recent foreign travel       Relevant Medications   rifaximin (XIFAXAN) 200 MG tablet   Other Relevant Orders   CBC with Differential/Platelet   Stool culture   Clostridium Difficile by PCR   Hepatitis A antibody, IgM   Comprehensive metabolic panel      Persistent watery diarrhea for 2 weeks following travel to [place]. No fever, blood in stool, or vomiting. Hypotension noted, likely secondary to dehydration. -Collect stool sample  -Check blood for electrolyte imbalances and Hepatitis A. -Will try treating with Rifaximin, given she cannot take azithromycin -Advise patient to monitor blood pressure at home and seek immediate medical attention if it drops or if symptoms worsen.        Meds ordered this encounter  Medications   rifaximin (XIFAXAN) 200 MG tablet    Sig: Take 1  tablet (200 mg total) by mouth 3 (three) times daily for 3 days.    Dispense:  9 tablet    Refill:  0    Supervising Provider:   [name] Edge A [[id number]]    Return if symptoms worsen or fail to improve.  Clayborne Dana, NP

## 2023-07-13 LAB — CLOSTRIDIUM DIFFICILE BY PCR: Toxigenic C. Difficile by PCR: NEGATIVE

## 2023-07-13 LAB — HEPATITIS A ANTIBODY, IGM: Hep A IgM: NONREACTIVE

## 2023-07-16 ENCOUNTER — Other Ambulatory Visit (HOSPITAL_BASED_OUTPATIENT_CLINIC_OR_DEPARTMENT_OTHER): Payer: Self-pay

## 2023-07-16 ENCOUNTER — Telehealth: Payer: Self-pay

## 2023-07-16 ENCOUNTER — Encounter (HOSPITAL_BASED_OUTPATIENT_CLINIC_OR_DEPARTMENT_OTHER): Payer: Self-pay

## 2023-07-16 NOTE — Telephone Encounter (Signed)
PA initiated via Covermymeds; KEY: [id number]. Awaiting determination.

## 2023-07-17 LAB — STOOL CULTURE: E coli, Shiga toxin Assay: NEGATIVE

## 2023-07-17 NOTE — Telephone Encounter (Signed)
 PA approved.   The request has been approved. The authorization is effective for a maximum of 1 fills from 07/16/2023 to 08/16/2023, as long as the member is enrolled in their current health plan. This has been approved for a day supply limit of 3. A written notification letter will follow with additional details. Authorization Expiration Date: 08/15/2023

## 2023-07-17 NOTE — Progress Notes (Addendum)
 COVID Vaccine received:  []  No [x]  Yes Date of any COVID positive Test in last 90 days: no PCP - Mabel Pry DO Cardiologist - no  Chest x-ray -  EKG -   Stress Test -  ECHO -  Cardiac Cath -   Bowel Prep - [x]  No  []   Yes ______  Pacemaker / ICD device [x]  No []  Yes   Spinal Cord Stimulator:[x]  No []  Yes       History of Sleep Apnea? [x]  No []  Yes   CPAP used?- [x]  No []  Yes    Does the patient monitor blood sugar?          [x]  No []  Yes  []  N/A  Patient has: [x]  NO Hx DM   []  Pre-DM                 []  DM1  []   DM2 Does patient have a [employer] or Dexacom? []  No []  Yes   Fasting Blood Sugar Ranges-  Checks Blood Sugar _____ times a day  GLP1 agonist / usual dose - no GLP1 instructions:  SGLT-2 inhibitors / usual dose - no SGLT-2 instructions:   Blood Thinner / Instructions:no Aspirin Instructions:no  Comments:   Activity level: Patient is able  to climb a flight of stairs without difficulty; [x]  No CP  [x]  No SOB,___   Patient can perform ADLs without assistance.   Anesthesia review: Pseudotumor cerebri , HTN  Patient denies shortness of breath, fever, cough and chest pain at PAT appointment.  Patient verbalized understanding and agreement to the Pre-Surgical Instructions that were given to them at this PAT appointment. Patient was also educated of the need to review these PAT instructions again prior to his/her surgery.I reviewed the appropriate phone numbers to call if they have any and questions or concerns.

## 2023-07-17 NOTE — Patient Instructions (Signed)
 SURGICAL WAITING ROOM VISITATION  Patients having surgery or a procedure may have no more than 2 support people in the waiting area - these visitors may rotate.    Children under the age of 9 must have an adult with them who is not the patient.  Due to an increase in RSV and influenza rates and associated hospitalizations, children ages 76 and under may not visit patients in [facility] hospitals.  If the patient needs to stay at the hospital during part of their recovery, the visitor guidelines for inpatient rooms apply. Pre-op nurse will coordinate an appropriate time for 1 support person to accompany patient in pre-op.  This support person may not rotate.    Please refer to the [facility] website for the visitor guidelines for Inpatients (after your surgery is over and you are in a regular room).       Your procedure is scheduled on: 08/01/23   Report to [facility] Main Entrance    Report to admitting at 6:15 AM   Call this number if you have problems the morning of surgery [phone]   Do not eat food or drink liquids:After Midnight.     Clear liquid diet the day before surgery.   FOLLOW BOWEL PREP AND ANY ADDITIONAL PRE OP INSTRUCTIONS YOU RECEIVED FROM YOUR SURGEON'S OFFICE!!!     Oral Hygiene is also important to reduce your risk of infection.                                    Remember - BRUSH YOUR TEETH THE MORNING OF SURGERY WITH YOUR REGULAR TOOTHPASTE   Stop all vitamins and herbal supplements 7 days before surgery.   Take these medicines the morning of surgery with A SIP OF WATER : tylenol  if needed, diamox              You may not have any metal on your body including hair pins, jewelry, and body piercing             Do not wear make-up, lotions, powders, perfumes/cologne, or deodorant  Do not wear nail polish including gel and S&S, artificial/acrylic nails, or any other type of covering on natural nails including finger and toenails. If you have  artificial nails, gel coating, etc. that needs to be removed by a nail salon please have this removed prior to surgery or surgery may need to be canceled/ delayed if the surgeon/ anesthesia feels like they are unable to be safely monitored.   Do not shave  48 hours prior to surgery.    Do not bring valuables to the hospital. [facility] IS NOT             RESPONSIBLE   FOR VALUABLES.   Contacts, glasses, dentures or bridgework may not be worn into surgery.   Bring small overnight bag day of surgery.   DO NOT BRING YOUR HOME MEDICATIONS TO THE HOSPITAL. PHARMACY WILL DISPENSE MEDICATIONS LISTED ON YOUR MEDICATION LIST TO YOU DURING YOUR ADMISSION IN THE HOSPITAL!    Patients discharged on the day of surgery will not be allowed to drive home.  Someone NEEDS to stay with you for the first 24 hours after anesthesia.   Special Instructions: Bring a copy of your healthcare power of attorney and living will documents the day of surgery if you haven't scanned them before.  Please read over the following fact sheets you were given: IF YOU HAVE QUESTIONS ABOUT YOUR PRE-OP INSTRUCTIONS PLEASE CALL [phone] Verneita   If you received a COVID test during your pre-op visit  it is requested that you wear a mask when out in public, stay away from anyone that may not be feeling well and notify your surgeon if you develop symptoms. If you test positive for Covid or have been in contact with anyone that has tested positive in the last 10 days please notify you surgeon.    [facility] - Preparing for Surgery Before surgery, you can play an important role.  Because skin is not sterile, your skin needs to be as free of germs as possible.  You can reduce the number of germs on your skin by washing with CHG (chlorahexidine gluconate) soap before surgery.  CHG is an antiseptic cleaner which kills germs and bonds with the skin to continue killing germs even after washing. Please DO NOT use if you have  an allergy to CHG or antibacterial soaps.  If your skin becomes reddened/irritated stop using the CHG and inform your nurse when you arrive at Short Stay. Do not shave (including legs and underarms) for at least 48 hours prior to the first CHG shower.  You may shave your face/neck.  Please follow these instructions carefully:  1.  Shower with CHG Soap the night before surgery and the  morning of surgery.  2.  If you choose to wash your hair, wash your hair first as usual with your normal  shampoo.  3.  After you shampoo, rinse your hair and body thoroughly to remove the shampoo.                             4.  Use CHG as you would any other liquid soap.  You can apply chg directly to the skin and wash.  Gently with a scrungie or clean washcloth.  5.  Apply the CHG Soap to your body ONLY FROM THE NECK DOWN.   Do   not use on face/ open                           Wound or open sores. Avoid contact with eyes, ears mouth and   genitals (private parts).                       Wash face,  Genitals (private parts) with your normal soap.             6.  Wash thoroughly, paying special attention to the area where your    surgery  will be performed.  7.  Thoroughly rinse your body with warm water  from the neck down.  8.  DO NOT shower/wash with your normal soap after using and rinsing off the CHG Soap.                9.  Pat yourself dry with a clean towel.            10.  Wear clean pajamas.            11.  Place clean sheets on your bed the night of your first shower and do not  sleep with pets. Day of Surgery : Do not apply any lotions/deodorants the morning of surgery.  Please wear clean clothes to the hospital/surgery center.  FAILURE TO FOLLOW THESE INSTRUCTIONS MAY RESULT IN THE CANCELLATION OF YOUR SURGERY  PATIENT SIGNATURE_________________________________  NURSE SIGNATURE__________________________________  ________________________________________________________________________

## 2023-07-23 ENCOUNTER — Encounter (HOSPITAL_COMMUNITY): Payer: Self-pay

## 2023-07-23 ENCOUNTER — Other Ambulatory Visit: Payer: Self-pay

## 2023-07-23 ENCOUNTER — Encounter (HOSPITAL_COMMUNITY)
Admission: RE | Admit: 2023-07-23 | Discharge: 2023-07-23 | Disposition: A | Discharge status: Home / Self Care | Payer: Commercial Managed Care - PPO | Source: Ambulatory Visit | Attending: Urology | Admitting: Urology

## 2023-07-23 VITALS — BP 120/81 | HR 85 | Temp 98.0°F | Resp 16 | Ht 63.0 in | Wt 213.0 lb

## 2023-07-23 DIAGNOSIS — G8929 Other chronic pain: Secondary | ICD-10-CM | POA: Diagnosis not present

## 2023-07-23 DIAGNOSIS — M549 Dorsalgia, unspecified: Secondary | ICD-10-CM | POA: Diagnosis not present

## 2023-07-23 DIAGNOSIS — M48061 Spinal stenosis, lumbar region without neurogenic claudication: Secondary | ICD-10-CM | POA: Insufficient documentation

## 2023-07-23 DIAGNOSIS — I10 Essential (primary) hypertension: Secondary | ICD-10-CM | POA: Diagnosis not present

## 2023-07-23 DIAGNOSIS — Z01818 Encounter for other preprocedural examination: Secondary | ICD-10-CM | POA: Insufficient documentation

## 2023-07-23 DIAGNOSIS — F419 Anxiety disorder, unspecified: Secondary | ICD-10-CM | POA: Insufficient documentation

## 2023-07-23 DIAGNOSIS — D179 Benign lipomatous neoplasm, unspecified: Secondary | ICD-10-CM | POA: Insufficient documentation

## 2023-07-23 HISTORY — DX: Headache, unspecified: R51.9

## 2023-07-23 HISTORY — DX: Attention-deficit hyperactivity disorder, unspecified type: F90.9

## 2023-07-23 LAB — CBC
HCT: 37.6 % (ref 36.0–46.0)
Hemoglobin: 12.1 g/dL (ref 12.0–15.0)
MCH: 32.4 pg (ref 26.0–34.0)
MCHC: 32.2 g/dL (ref 30.0–36.0)
MCV: 100.8 fL — ABNORMAL HIGH (ref 80.0–100.0)
Platelets: 354 10*3/uL (ref 150–400)
RBC: 3.73 MIL/uL — ABNORMAL LOW (ref 3.87–5.11)
RDW: 15.6 % — ABNORMAL HIGH (ref 11.5–15.5)
WBC: 8.1 10*3/uL (ref 4.0–10.5)
nRBC: 0 % (ref 0.0–0.2)

## 2023-07-24 DIAGNOSIS — D1771 Benign lipomatous neoplasm of kidney: Secondary | ICD-10-CM | POA: Diagnosis not present

## 2023-07-24 NOTE — Anesthesia Preprocedure Evaluation (Addendum)
 Anesthesia Evaluation  Patient identified by MRN, date of birth, ID band Patient awake    Reviewed: Allergy & Precautions, NPO status , Patient's Chart, lab work & pertinent test results  History of Anesthesia Complications (+) POST - OP SPINAL HEADACHE and history of anesthetic complications  Airway Mallampati: II  TM Distance: >3 FB Neck ROM: Full    Dental  (+) Teeth Intact, Dental Advisory Given   Pulmonary neg pulmonary ROS   Pulmonary exam normal breath sounds clear to auscultation       Cardiovascular hypertension, Pt. on medications Normal cardiovascular exam Rhythm:Regular Rate:Normal     Neuro/Psych  Headaches  Anxiety      Neuromuscular disease    GI/Hepatic Neg liver ROS,GERD  ,,  Endo/Other  negative endocrine ROS  Obesity   Renal/GU  LEFT RENAL MASS     Musculoskeletal negative musculoskeletal ROS (+)    Abdominal   Peds  (+) ADHD Hematology negative hematology ROS (+)   Anesthesia Other Findings   Reproductive/Obstetrics                             Anesthesia Physical Anesthesia Plan  ASA: 3  Anesthesia Plan: General   Post-op Pain Management: Tylenol  PO (pre-op)* and Gabapentin  PO (pre-op)*   Induction: Intravenous  PONV Risk Score and Plan: 4 or greater and Midazolam , Scopolamine patch - Pre-op, Dexamethasone  and Ondansetron   Airway Management Planned: Oral ETT  Additional Equipment: ClearSight  Intra-op Plan:   Post-operative Plan: Extubation in OR  Informed Consent: I have reviewed the patients History and Physical, chart, labs and discussed the procedure including the risks, benefits and alternatives for the proposed anesthesia with the patient or authorized representative who has indicated his/her understanding and acceptance.     Dental advisory given  Plan Discussed with: CRNA  Anesthesia Plan Comments: (See PAT note from 1/6 by K Gekas  PA-C  2nd PIV after induction)        Anesthesia Quick Evaluation

## 2023-07-24 NOTE — Progress Notes (Signed)
 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 PMH of HTN, intracranial HTN, depression, anxiety, chronic back pain, lumbar spinal stenosis  Patient follows with Neurology for hx of intracranial HTN. Last seen by Dr. Onita on 03/12/23. She reported intermittent blurry vision. Several tx options were offered however patient refused this and she was referred to [facility] but has not had an appointment there yet. She was evaluated by ophthalmology and eye exam showed chronic findings.  Patient follows with PCP. Diagnosed with walking pneumonia on 05/14/23. She was prescribed Augmentin  but did not improve and was then prescribed Levaquin . Last seen on 07/12/23. Noted that her respiratory symptoms were improved but she recently traveled to [place] and started to have diarrhea. All testing including C. Diff and stool cultures came back negative.  VS: BP 120/81   Pulse 85   Temp 36.7 C (Oral)   Resp 16   Ht 5' 3 (1.6 m)   Wt 96.6 kg   LMP 07/17/2023 (Approximate)   SpO2 100%   BMI 37.73 kg/m   PROVIDERS: Frann Mabel Mt, DO Neurology: Onita  LABS: Labs reviewed: Acceptable for surgery. (all labs ordered are listed, but only abnormal results are displayed)  Labs Reviewed  CBC - Abnormal; Notable for the following components:      Result Value   RBC 3.73 (*)    MCV 100.8 (*)    RDW 15.6 (*)    All other components within normal limits  TYPE AND SCREEN     IMAGES:  MRI abdomen 04/08/23:  IMPRESSION: Unchanged lesion predominantly composed of macroscopic fat situated about the peripheral left kidney, measuring 5.4 x 5.0 x 3.0 cm. Lesion demonstrates some degree of diffuse internal contrast enhancement and appears to arise from the peripheral inferior pole of the left kidney. Given appearance and relation to the kidney  this remains most consistent with a large angiomyolipoma. Retroperitoneal liposarcoma remains a less favored, although possible differential consideration. Although most likely benign, as previously reported, a lesion of this size has some risk of spontaneous hemorrhage, and therapeutic intervention as well as ongoing surveillance should be considered.   MRI brain 06/14/2022:  IMPRESSION: 1. No acute intracranial abnormality. 2. 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 Correlation with opening pressure suggested.   EKG 07/23/23  NSR, rate 76  CV:  Past Medical History:  Diagnosis Date   ADHD (attention deficit hyperactivity disorder)    Anemia    Anemia    Anxiety    Clotting disorder (HCC)    Headache    Hyperlipemia    Hypertension    Hypertension     Past Surgical History:  Procedure Laterality Date   Gallbladder Removal 2017     lancinectomy and Discectomy 2021     Dr Sherryl - Neurosurgeon    MEDICATIONS:  acetaminophen  (TYLENOL ) 500 MG tablet   acetaZOLAMIDE  (DIAMOX ) 250 MG tablet   amitriptyline  (ELAVIL ) 25 MG tablet   amLODipine  (NORVASC ) 5 MG tablet   ferrous sulfate ER (SLOW FE) 142 (45 Fe) MG TBCR tablet   lisinopril  (ZESTRIL ) 20 MG tablet   No current facility-administered medications for this encounter.   Burnard CHRISTELLA Odis DEVONNA [facility] Surgical Short Stay/Anesthesiology [facility] Phone [phone] 07/24/2023 1:26 PM

## 2023-08-01 ENCOUNTER — Other Ambulatory Visit: Payer: Self-pay

## 2023-08-01 ENCOUNTER — Ambulatory Visit (HOSPITAL_COMMUNITY): Payer: Commercial Managed Care - PPO | Admitting: Medical

## 2023-08-01 ENCOUNTER — Ambulatory Visit (HOSPITAL_BASED_OUTPATIENT_CLINIC_OR_DEPARTMENT_OTHER): Payer: Commercial Managed Care - PPO | Admitting: Certified Registered"

## 2023-08-01 ENCOUNTER — Ambulatory Visit (HOSPITAL_COMMUNITY)
Admission: RE | Admit: 2023-08-01 | Discharge: 2023-08-02 | Disposition: A | Discharge status: Home / Self Care | Payer: Commercial Managed Care - PPO | Source: Ambulatory Visit | Attending: Urology | Admitting: Urology

## 2023-08-01 ENCOUNTER — Other Ambulatory Visit (HOSPITAL_COMMUNITY): Payer: Self-pay

## 2023-08-01 ENCOUNTER — Encounter (HOSPITAL_COMMUNITY): Payer: Self-pay | Admitting: Urology

## 2023-08-01 ENCOUNTER — Encounter (HOSPITAL_COMMUNITY)
Admission: RE | Disposition: A | Discharge status: Home / Self Care | Payer: Self-pay | Source: Ambulatory Visit | Attending: Urology

## 2023-08-01 DIAGNOSIS — N2889 Other specified disorders of kidney and ureter: Secondary | ICD-10-CM

## 2023-08-01 DIAGNOSIS — G932 Benign intracranial hypertension: Secondary | ICD-10-CM | POA: Diagnosis not present

## 2023-08-01 DIAGNOSIS — D49512 Neoplasm of unspecified behavior of left kidney: Secondary | ICD-10-CM | POA: Diagnosis not present

## 2023-08-01 DIAGNOSIS — I10 Essential (primary) hypertension: Secondary | ICD-10-CM

## 2023-08-01 DIAGNOSIS — F419 Anxiety disorder, unspecified: Secondary | ICD-10-CM | POA: Diagnosis not present

## 2023-08-01 DIAGNOSIS — D1771 Benign lipomatous neoplasm of kidney: Secondary | ICD-10-CM | POA: Insufficient documentation

## 2023-08-01 DIAGNOSIS — Z01818 Encounter for other preprocedural examination: Secondary | ICD-10-CM

## 2023-08-01 HISTORY — PX: ROBOTIC ASSITED PARTIAL NEPHRECTOMY: SHX6087

## 2023-08-01 LAB — HEMOGLOBIN AND HEMATOCRIT, BLOOD
HCT: 41.5 % (ref 36.0–46.0)
Hemoglobin: 13.3 g/dL (ref 12.0–15.0)

## 2023-08-01 LAB — BASIC METABOLIC PANEL
Anion gap: 9 (ref 5–15)
BUN: 14 mg/dL (ref 6–20)
CO2: 15 mmol/L — ABNORMAL LOW (ref 22–32)
Calcium: 8.7 mg/dL — ABNORMAL LOW (ref 8.9–10.3)
Chloride: 113 mmol/L — ABNORMAL HIGH (ref 98–111)
Creatinine, Ser: 1.03 mg/dL — ABNORMAL HIGH (ref 0.44–1.00)
GFR, Estimated: >60 mL/min (ref >60)
Glucose, Bld: 91 mg/dL (ref 70–99)
Potassium: 4.1 mmol/L (ref 3.5–5.1)
Sodium: 137 mmol/L (ref 135–145)

## 2023-08-01 LAB — TYPE AND SCREEN
ABO/RH(D): O POS
Antibody Screen: NEGATIVE

## 2023-08-01 LAB — POCT PREGNANCY, URINE: Preg Test, Ur: NEGATIVE

## 2023-08-01 LAB — ABO/RH: ABO/RH(D): O POS

## 2023-08-01 SURGERY — NEPHRECTOMY, PARTIAL, ROBOT-ASSISTED
Anesthesia: General | Laterality: Left

## 2023-08-01 MED ORDER — SODIUM CHLORIDE (PF) 0.9 % IJ SOLN
INTRAMUSCULAR | Status: AC
Start: 2023-08-01 — End: ?
  Filled 2023-08-01: qty 40

## 2023-08-01 MED ORDER — FENTANYL CITRATE PF 50 MCG/ML IJ SOSY
PREFILLED_SYRINGE | INTRAMUSCULAR | Status: AC
  Filled 2023-08-01: qty 2

## 2023-08-01 MED ORDER — ONDANSETRON HCL 4 MG/2ML IJ SOLN
INTRAMUSCULAR | Status: DC | PRN
  Administered 2023-08-01: 4 mg via INTRAVENOUS

## 2023-08-01 MED ORDER — BUPIVACAINE LIPOSOME 1.3 % IJ SUSP
INTRAMUSCULAR | Status: DC | PRN
  Administered 2023-08-01: 20 mL

## 2023-08-01 MED ORDER — PROPOFOL 10 MG/ML IV BOLUS
INTRAVENOUS | Status: AC
  Filled 2023-08-01: qty 20

## 2023-08-01 MED ORDER — ONDANSETRON HCL 4 MG/2ML IJ SOLN: 4.0000 mg | Freq: Once | INTRAMUSCULAR | Status: DC | PRN

## 2023-08-01 MED ORDER — PROPOFOL 10 MG/ML IV BOLUS
INTRAVENOUS | Status: DC | PRN
  Administered 2023-08-01: 180 mg via INTRAVENOUS

## 2023-08-01 MED ORDER — SODIUM CHLORIDE (PF) 0.9 % IJ SOLN
INTRAMUSCULAR | Status: DC | PRN
  Administered 2023-08-01: 20 mL via INTRAVENOUS

## 2023-08-01 MED ORDER — HYDROMORPHONE HCL 1 MG/ML IJ SOLN
0.5000 mg | INTRAMUSCULAR | Status: DC | PRN
Start: 2023-08-01 — End: 2023-08-02
  Administered 2023-08-01 – 2023-08-02 (×2): 1 mg via INTRAVENOUS
  Filled 2023-08-01 (×2): qty 1

## 2023-08-01 MED ORDER — ACETAMINOPHEN 10 MG/ML IV SOLN
INTRAVENOUS | Status: AC
  Filled 2023-08-01: qty 100

## 2023-08-01 MED ORDER — SODIUM CHLORIDE (PF) 0.9 % IJ SOLN
INTRAMUSCULAR | Status: AC
  Filled 2023-08-01: qty 20

## 2023-08-01 MED ORDER — FENTANYL CITRATE (PF) 100 MCG/2ML IJ SOLN
INTRAMUSCULAR | Status: DC | PRN
  Administered 2023-08-01 (×2): 50 ug via INTRAVENOUS
  Administered 2023-08-01: 100 ug via INTRAVENOUS

## 2023-08-01 MED ORDER — HYDROMORPHONE HCL 1 MG/ML IJ SOLN
0.2500 mg | INTRAMUSCULAR | Status: DC | PRN
  Administered 2023-08-01: 0.5 mg via INTRAVENOUS

## 2023-08-01 MED ORDER — ORAL CARE MOUTH RINSE: 15.0000 mL | Freq: Once | OROMUCOSAL | Status: AC

## 2023-08-01 MED ORDER — HYDRALAZINE HCL 20 MG/ML IJ SOLN: 5.0000 mg | Freq: Four times a day (QID) | INTRAMUSCULAR | Status: DC | PRN

## 2023-08-01 MED ORDER — FENTANYL CITRATE (PF) 100 MCG/2ML IJ SOLN
INTRAMUSCULAR | Status: AC
  Filled 2023-08-01: qty 2

## 2023-08-01 MED ORDER — LISINOPRIL 20 MG PO TABS
20.0000 mg | ORAL_TABLET | Freq: Every day | ORAL | Status: DC
  Administered 2023-08-01: 20 mg via ORAL
  Filled 2023-08-01 (×2): qty 1

## 2023-08-01 MED ORDER — AMISULPRIDE (ANTIEMETIC) 5 MG/2ML IV SOLN: 10.0000 mg | Freq: Once | INTRAVENOUS | Status: DC | PRN

## 2023-08-01 MED ORDER — PHENYLEPHRINE HCL-NACL 20-0.9 MG/250ML-% IV SOLN
INTRAVENOUS | Status: DC | PRN
  Administered 2023-08-01: 20 ug/min via INTRAVENOUS

## 2023-08-01 MED ORDER — ACETAMINOPHEN 500 MG PO TABS
1000.0000 mg | ORAL_TABLET | Freq: Once | ORAL | Status: AC
  Administered 2023-08-01: 1000 mg via ORAL
  Filled 2023-08-01: qty 2

## 2023-08-01 MED ORDER — AMLODIPINE BESYLATE 5 MG PO TABS
5.0000 mg | ORAL_TABLET | Freq: Every day | ORAL | Status: DC
  Administered 2023-08-01: 5 mg via ORAL
  Filled 2023-08-01: qty 1

## 2023-08-01 MED ORDER — DOCUSATE SODIUM 100 MG PO CAPS
100.0000 mg | ORAL_CAPSULE | Freq: Two times a day (BID) | ORAL | Status: DC
  Administered 2023-08-02: 100 mg via ORAL
  Filled 2023-08-01 (×2): qty 1

## 2023-08-01 MED ORDER — ROCURONIUM BROMIDE 10 MG/ML (PF) SYRINGE
PREFILLED_SYRINGE | INTRAVENOUS | Status: DC | PRN
  Administered 2023-08-01: 80 mg via INTRAVENOUS
  Administered 2023-08-01: 20 mg via INTRAVENOUS

## 2023-08-01 MED ORDER — LIDOCAINE 2% (20 MG/ML) 5 ML SYRINGE
INTRAMUSCULAR | Status: DC | PRN
  Administered 2023-08-01: 40 mg via INTRAVENOUS

## 2023-08-01 MED ORDER — CEFAZOLIN SODIUM-DEXTROSE 2-4 GM/100ML-% IV SOLN
2.0000 g | INTRAVENOUS | Status: AC
  Administered 2023-08-01: 2 g via INTRAVENOUS
  Filled 2023-08-01: qty 100

## 2023-08-01 MED ORDER — AMITRIPTYLINE HCL 25 MG PO TABS
25.0000 mg | ORAL_TABLET | Freq: Every day | ORAL | Status: DC
Start: 2023-08-01 — End: 2023-08-02
  Administered 2023-08-01: 25 mg via ORAL
  Filled 2023-08-01: qty 1

## 2023-08-01 MED ORDER — DOCUSATE SODIUM 100 MG PO CAPS: 100.0000 mg | ORAL_CAPSULE | Freq: Two times a day (BID) | ORAL | Status: DC

## 2023-08-01 MED ORDER — STERILE WATER FOR IRRIGATION IR SOLN
Status: DC | PRN
  Administered 2023-08-01: 1000 mL

## 2023-08-01 MED ORDER — ONDANSETRON HCL 4 MG/2ML IJ SOLN
INTRAMUSCULAR | Status: AC
  Filled 2023-08-01: qty 2

## 2023-08-01 MED ORDER — SODIUM CHLORIDE 0.9 % IV SOLN: INTRAVENOUS | Status: DC

## 2023-08-01 MED ORDER — MIDAZOLAM HCL 2 MG/2ML IJ SOLN
INTRAMUSCULAR | Status: DC | PRN
  Administered 2023-08-01: 2 mg via INTRAVENOUS

## 2023-08-01 MED ORDER — LISINOPRIL 20 MG PO TABS: 20.0000 mg | ORAL_TABLET | Freq: Every day | ORAL | Status: DC

## 2023-08-01 MED ORDER — DEXAMETHASONE SODIUM PHOSPHATE 10 MG/ML IJ SOLN
INTRAMUSCULAR | Status: DC | PRN
  Administered 2023-08-01: 8 mg via INTRAVENOUS

## 2023-08-01 MED ORDER — MAGNESIUM CITRATE PO SOLN
1.0000 | Freq: Once | ORAL | Status: DC
  Filled 2023-08-01: qty 296

## 2023-08-01 MED ORDER — ORAL CARE MOUTH RINSE: 15.0000 mL | OROMUCOSAL | Status: DC | PRN

## 2023-08-01 MED ORDER — HYDROCODONE-ACETAMINOPHEN 5-325 MG PO TABS
1.0000 | ORAL_TABLET | Freq: Four times a day (QID) | ORAL | 0 refills | Status: DC | PRN
  Filled 2023-08-01: qty 20, 3d supply, fill #0

## 2023-08-01 MED ORDER — ACETAMINOPHEN 10 MG/ML IV SOLN
1000.0000 mg | Freq: Four times a day (QID) | INTRAVENOUS | Status: DC
  Administered 2023-08-02 (×2): 1000 mg via INTRAVENOUS
  Filled 2023-08-01 (×3): qty 100

## 2023-08-01 MED ORDER — HYDROMORPHONE HCL 1 MG/ML IJ SOLN
INTRAMUSCULAR | Status: AC
  Filled 2023-08-01: qty 1

## 2023-08-01 MED ORDER — ACETAZOLAMIDE 250 MG PO TABS
750.0000 mg | ORAL_TABLET | Freq: Every day | ORAL | Status: DC
  Administered 2023-08-01: 750 mg via ORAL
  Filled 2023-08-01: qty 3

## 2023-08-01 MED ORDER — ACETAMINOPHEN 10 MG/ML IV SOLN
1000.0000 mg | Freq: Once | INTRAVENOUS | Status: AC
  Administered 2023-08-01: 1000 mg via INTRAVENOUS

## 2023-08-01 MED ORDER — CHLORHEXIDINE GLUCONATE 0.12 % MT SOLN
15.0000 mL | Freq: Once | OROMUCOSAL | Status: AC
  Administered 2023-08-01: 15 mL via OROMUCOSAL

## 2023-08-01 MED ORDER — SUGAMMADEX SODIUM 200 MG/2ML IV SOLN
INTRAVENOUS | Status: DC | PRN
  Administered 2023-08-01: 200 mg via INTRAVENOUS

## 2023-08-01 MED ORDER — MIDAZOLAM HCL 2 MG/2ML IJ SOLN
INTRAMUSCULAR | Status: AC
  Filled 2023-08-01: qty 2

## 2023-08-01 MED ORDER — ROCURONIUM BROMIDE 10 MG/ML (PF) SYRINGE
PREFILLED_SYRINGE | INTRAVENOUS | Status: AC
  Filled 2023-08-01: qty 10

## 2023-08-01 MED ORDER — LACTATED RINGERS IV SOLN: INTRAVENOUS | Status: DC

## 2023-08-01 MED ORDER — BUPIVACAINE LIPOSOME 1.3 % IJ SUSP
INTRAMUSCULAR | Status: AC
  Filled 2023-08-01: qty 20

## 2023-08-01 MED ORDER — DEXAMETHASONE SODIUM PHOSPHATE 10 MG/ML IJ SOLN
INTRAMUSCULAR | Status: AC
  Filled 2023-08-01: qty 1

## 2023-08-01 MED ORDER — PHENYLEPHRINE 80 MCG/ML (10ML) SYRINGE FOR IV PUSH (FOR BLOOD PRESSURE SUPPORT)
PREFILLED_SYRINGE | INTRAVENOUS | Status: DC | PRN
  Administered 2023-08-01: 80 ug via INTRAVENOUS

## 2023-08-01 MED ORDER — OXYCODONE HCL 5 MG PO TABS: 5.0000 mg | ORAL_TABLET | ORAL | Status: DC | PRN

## 2023-08-01 MED ORDER — FENTANYL CITRATE PF 50 MCG/ML IJ SOSY
PREFILLED_SYRINGE | INTRAMUSCULAR | Status: AC
  Administered 2023-08-01: 50 ug via INTRAVENOUS
  Filled 2023-08-01: qty 1

## 2023-08-01 MED ORDER — FENTANYL CITRATE PF 50 MCG/ML IJ SOSY
25.0000 ug | PREFILLED_SYRINGE | INTRAMUSCULAR | Status: DC | PRN
  Administered 2023-08-01 (×2): 50 ug via INTRAVENOUS

## 2023-08-01 MED ORDER — GABAPENTIN 300 MG PO CAPS
300.0000 mg | ORAL_CAPSULE | Freq: Once | ORAL | Status: AC
  Administered 2023-08-01: 300 mg via ORAL
  Filled 2023-08-01: qty 1

## 2023-08-01 SURGICAL SUPPLY — 72 items
APPLICATOR SURGIFLO ENDO (HEMOSTASIS) ×1 IMPLANT
BAG COUNTER SPONGE SURGICOUNT (BAG) IMPLANT
CHLORAPREP W/TINT 26 (MISCELLANEOUS) ×1 IMPLANT
CLIP LIGATING HEM O LOK PURPLE (MISCELLANEOUS) ×2 IMPLANT
CLIP LIGATING HEMO LOK XL GOLD (MISCELLANEOUS) IMPLANT
CLIP LIGATING HEMO O LOK GREEN (MISCELLANEOUS) ×1 IMPLANT
CLIP SUT LAPRA TY ABSORB (SUTURE) ×1 IMPLANT
COVER SURGICAL LIGHT HANDLE (MISCELLANEOUS) ×1 IMPLANT
COVER TIP SHEARS 8 DVNC (MISCELLANEOUS) ×1 IMPLANT
CUTTER ECHEON FLEX ENDO 45 340 (ENDOMECHANICALS) IMPLANT
DERMABOND ADVANCED .7 DNX12 (GAUZE/BANDAGES/DRESSINGS) ×1 IMPLANT
DRAIN CHANNEL 15F RND FF 3/16 (WOUND CARE) ×1 IMPLANT
DRAPE ARM DVNC X/XI (DISPOSABLE) ×4 IMPLANT
DRAPE COLUMN DVNC XI (DISPOSABLE) ×1 IMPLANT
DRAPE INCISE IOBAN 66X45 STRL (DRAPES) ×1 IMPLANT
DRAPE SHEET LG 3/4 BI-LAMINATE (DRAPES) ×1 IMPLANT
DRIVER NDL LRG 8 DVNC XI (INSTRUMENTS) ×2 IMPLANT
DRIVER NDLE LRG 8 DVNC XI (INSTRUMENTS) ×2 IMPLANT
DRSG TEGADERM 4X4.75 (GAUZE/BANDAGES/DRESSINGS) ×1 IMPLANT
ELECT PENCIL ROCKER SW 15FT (MISCELLANEOUS) ×1 IMPLANT
ELECT REM PT RETURN 15FT ADLT (MISCELLANEOUS) ×1 IMPLANT
EVACUATOR SILICONE 100CC (DRAIN) ×1 IMPLANT
FORCEPS BPLR FENES DVNC XI (FORCEP) ×1 IMPLANT
FORCEPS PROGRASP DVNC XI (FORCEP) ×1 IMPLANT
GAUZE 4X4 16PLY ~~LOC~~+RFID DBL (SPONGE) ×1 IMPLANT
GAUZE SPONGE 2X2 8PLY STRL LF (GAUZE/BANDAGES/DRESSINGS) ×1 IMPLANT
GLOVE BIO SURGEON STRL SZ 6.5 (GLOVE) ×1 IMPLANT
GLOVE SURG LX STRL 7.5 STRW (GLOVE) ×2 IMPLANT
GOWN STRL REUS W/ TWL XL LVL3 (GOWN DISPOSABLE) ×2 IMPLANT
GOWN STRL SURGICAL XL XLNG (GOWN DISPOSABLE) ×1 IMPLANT
HEMOSTAT SURGICEL 4X8 (HEMOSTASIS) ×1 IMPLANT
HOLDER FOLEY CATH W/STRAP (MISCELLANEOUS) ×1 IMPLANT
IRRIG SUCT STRYKERFLOW 2 WTIP (MISCELLANEOUS) ×1 IMPLANT
IRRIGATION SUCT STRKRFLW 2 WTP (MISCELLANEOUS) ×1 IMPLANT
KIT BASIN OR (CUSTOM PROCEDURE TRAY) ×1 IMPLANT
KIT TURNOVER KIT A (KITS) IMPLANT
LOOP VESSEL MAXI BLUE (MISCELLANEOUS) ×1 IMPLANT
MARKER SKIN DUAL TIP RULER LAB (MISCELLANEOUS) ×1 IMPLANT
NDL HYPO 22X1.5 SAFETY MO (MISCELLANEOUS) ×1 IMPLANT
NDL INSUFFLATION 14GA 120MM (NEEDLE) ×1 IMPLANT
NEEDLE HYPO 22X1.5 SAFETY MO (MISCELLANEOUS) ×1 IMPLANT
NEEDLE INSUFFLATION 14GA 120MM (NEEDLE) ×1 IMPLANT
PORT ACCESS TROCAR AIRSEAL 12 (TROCAR) ×1 IMPLANT
PROTECTOR NERVE ULNAR (MISCELLANEOUS) ×2 IMPLANT
RELOAD STAPLE 45 2.6 WHT THIN (STAPLE) IMPLANT
SCISSORS LAP 5X45 EPIX DISP (ENDOMECHANICALS) IMPLANT
SCISSORS MNPLR CVD DVNC XI (INSTRUMENTS) ×1 IMPLANT
SEAL UNIV 5-12 XI (MISCELLANEOUS) ×4 IMPLANT
SET TRI-LUMEN FLTR TB AIRSEAL (TUBING) ×1 IMPLANT
SOL ELECTROSURG ANTI STICK (MISCELLANEOUS) ×1 IMPLANT
SOLUTION ELECTROSURG ANTI STCK (MISCELLANEOUS) ×1 IMPLANT
SPIKE FLUID TRANSFER (MISCELLANEOUS) ×1 IMPLANT
SPONGE T-LAP 4X18 ~~LOC~~+RFID (SPONGE) ×1 IMPLANT
STAPLE RELOAD 45 WHT (STAPLE) IMPLANT
SURGIFLO W/THROMBIN 8M KIT (HEMOSTASIS) ×1 IMPLANT
SUT ETHILON 3 0 PS 1 (SUTURE) ×1 IMPLANT
SUT MNCRL AB 4-0 PS2 18 (SUTURE) ×2 IMPLANT
SUT PDS AB 1 CT1 27 (SUTURE) ×2 IMPLANT
SUT V-LOC BARB 180 2/0GR6 GS22 (SUTURE) IMPLANT
SUT VIC AB 0 CT1 27XBRD ANTBC (SUTURE) ×4 IMPLANT
SUT VIC AB 2-0 SH 27X BRD (SUTURE) ×2 IMPLANT
SUT VLOC BARB 180 ABS3/0GR12 (SUTURE) ×1 IMPLANT
SUTURE V-LC BRB 180 2/0GR6GS22 (SUTURE) IMPLANT
SUTURE VLOC BRB 180 ABS3/0GR12 (SUTURE) ×1 IMPLANT
SYS BAG RETRIEVAL 10MM (BASKET) ×1 IMPLANT
SYSTEM BAG RETRIEVAL 10MM (BASKET) ×1 IMPLANT
TOWEL OR 17X26 10 PK STRL BLUE (TOWEL DISPOSABLE) ×1 IMPLANT
TRAY FOLEY MTR SLVR 16FR STAT (SET/KITS/TRAYS/PACK) ×1 IMPLANT
TRAY LAPAROSCOPIC (CUSTOM PROCEDURE TRAY) ×1 IMPLANT
TROCAR Z THREAD OPTICAL 12X100 (TROCAR) ×1 IMPLANT
TROCAR Z-THREAD OPTICAL 5X100M (TROCAR) IMPLANT
WATER STERILE IRR 1000ML POUR (IV SOLUTION) ×2 IMPLANT

## 2023-08-01 NOTE — Discharge Instructions (Addendum)
1- Drain Sites - You may have some mild persistent drainage from old drain site for several days, this is normal. This can be covered with cotton gauze for convenience.  2 - Stiches - Your stitches are all dissolvable. You may notice a "loose thread" at your incisions, these are normal and require no intervention. You may cut them flush to the skin with fingernail clippers if needed for comfort.  3 - Diet - No restrictions  4 - Activity - No heavy lifting / straining (any activities that require valsalva or "bearing down") x 4 weeks. Otherwise, no restrictions.  5 - Bathing - You may shower immediately. Do not take a bath or get into swimming pool where incision sites are submersed in water x 4 weeks.   6 -  When to Call the Doctor - Call MD for any fever >102, any acute wound problems, or any severe nausea / vomiting. You can call the [facility] [phone]) 24 hours a day 365 days a year. It will roll-over to the answering service and on-call physician after hours.  You may resume aspirin, advil, aleve, vitamins, and supplements 7 days after surgery.

## 2023-08-01 NOTE — Anesthesia Postprocedure Evaluation (Signed)
 Anesthesia Post Note  Patient: Helen Wells  Procedure(s) Performed: XI ROBOTIC ASSITED LEFT PARTIAL NEPHRECTOMY WITH ULTRASOUND (Left)     Patient location during evaluation: PACU Anesthesia Type: General Level of consciousness: awake and alert Pain management: pain level controlled Vital Signs Assessment: post-procedure vital signs reviewed and stable Respiratory status: spontaneous breathing, nonlabored ventilation and respiratory function stable Cardiovascular status: blood pressure returned to baseline and stable Postop Assessment: no apparent nausea or vomiting Anesthetic complications: no   No notable events documented.  Last Vitals:  Vitals:   08/01/23 1500 08/01/23 1529  BP: 108/66 118/79  Pulse: 94 97  Resp: 13 20  Temp:  36.8 C  SpO2: 100% 100%    Last Pain:  Vitals:   08/01/23 1529  TempSrc: Oral  PainSc:                  Erin Havers

## 2023-08-01 NOTE — Transfer of Care (Signed)
 Immediate Anesthesia Transfer of Care Note  Patient: Helen Wells  Procedure(s) Performed: XI ROBOTIC ASSITED LEFT PARTIAL NEPHRECTOMY WITH ULTRASOUND (Left)  Patient Location: PACU  Anesthesia Type:General  Level of Consciousness: awake, alert , and patient cooperative  Airway & Oxygen Therapy: Patient Spontanous Breathing and Patient connected to face mask oxygen  Post-op Assessment: Report given to RN and Post -op Vital signs reviewed and stable  Post vital signs: Reviewed and stable  Last Vitals:  Vitals Value Taken Time  BP 133/84 08/01/23 1054  Temp    Pulse 91 08/01/23 1056  Resp 18 08/01/23 1056  SpO2 100 % 08/01/23 1056  Vitals shown include unfiled device data.  Last Pain:  Vitals:   08/01/23 0642  TempSrc: Oral         Complications: No notable events documented.

## 2023-08-01 NOTE — H&P (Signed)
 Helen Wells is an 38 y.o. female.    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 1 artery / 1 vein (lumbar below artery) left renovascualr anatomy. Renal interface point below hilar axis about 2-3cm diameter. Some slight mass effect / compression of kidney w/o atrophy. SHe does NOT desire future fertility.   Recent Surveillance:  03/2023 - MRI Progression to 5.7cm left mass    PMH sig for obesity, intracranial HTN (multiple spinal taps), L spine surgery (in her 30s), lap chole. Her PCP is Dawna Etienne MD.   Today " Helen Wells " is seen ito proceed with LEFT robotic partial nephrectomy for enlarging AML. Hgb 12.1.    Past Medical History:  Diagnosis Date   ADHD (attention deficit hyperactivity disorder)    Anemia    Anemia    Anxiety    Clotting disorder (HCC)    Headache    Hyperlipemia    Hypertension    Hypertension     Past Surgical History:  Procedure Laterality Date   Gallbladder Removal 2017     lancinectomy and Discectomy 2021     Dr Gael Jolly - Neurosurgeon    Family History  Problem Relation Age of Onset   Hypertension Mother    Arthritis Mother    High blood pressure Mother    Hypertension Father    Birth defects Father    High Cholesterol Father    Learning disabilities Father    Alzheimer's disease Son    Social History:  reports that she has never smoked. She has never used smokeless tobacco. She reports current alcohol use of about 1.0 standard drink of alcohol per week. She reports that she does not use drugs.  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 Other reaction(s): Other Informed by her MD not to take any Tetracyclines. Informed by her MD not to take any Tetracyclines. Blind    Erythromycin Base     Other reaction(s): Other (See Comments) Other    Gluten Meal Diarrhea    Acid reflux/knots in stomach   Macrolides And Ketolides     Other reaction(s): Other (See Comments), Other (See Comments) Blindness, Pseudotumor Cerebri  Blindness, Pseudotumor Cerebri      Medications Prior to Admission  Medication Sig Dispense Refill   acetaminophen  (TYLENOL ) 500 MG tablet Take 1,000 mg by mouth every 6 (six) hours as needed (pain.).     acetaZOLAMIDE  (DIAMOX ) 250 MG tablet Take 2 tablets (500 mg total) by mouth 2 (two) times daily. (Patient taking differently: Take 750 mg by mouth at bedtime.) 120 tablet 11   amitriptyline  (ELAVIL ) 25 MG tablet Take 1 tablet (25 mg total) by mouth at bedtime. 90 tablet 2   amLODipine  (NORVASC ) 5 MG tablet Take 1 tablet (5 mg total) by mouth daily. (Patient taking differently: Take 5 mg by mouth at bedtime.) 90 tablet 3   lisinopril  (ZESTRIL ) 20 MG tablet Take 1 tablet (20 mg total) by mouth daily. (Patient taking differently: Take 20 mg by mouth at  bedtime.) 90 tablet 3   ferrous sulfate ER (SLOW FE) 142 (45 Fe) MG TBCR tablet Take 1 tablet by mouth at bedtime.      No results found for this or any previous visit (from the past 48 hours). No results found.  Review of Systems  Constitutional:  Negative for chills and fever.  Genitourinary:  Negative for hematuria.  All other systems reviewed and are negative.   Blood pressure 131/89, pulse 90, temperature 98 F (36.7 C), temperature source Oral, resp. rate 16, height 5\' 3"  (1.6 m), weight 96.6 kg, last menstrual period 07/17/2023, SpO2 100%. Physical Exam Vitals reviewed.  HENT:     Head: Normocephalic.  Eyes:     Pupils: Pupils are equal, round, and reactive to light.  Cardiovascular:      Rate and Rhythm: Normal rate.  Pulmonary:     Effort: Pulmonary effort is normal.  Abdominal:     General: Abdomen is flat.     Comments: Stable truncal obesity  Genitourinary:    Comments: No CVAT at present Musculoskeletal:        General: Normal range of motion.     Cervical back: Normal range of motion.  Skin:    General: Skin is warm.  Neurological:     General: No focal deficit present.     Mental Status: She is alert.  Psychiatric:        Mood and Affect: Mood normal.      Assessment/Plan  Proceed as planned with LEFT robotic partial neprhectomy for large AML. Risks, benefits, alternatives, expected peri-op course discussed previously and reiterated today.   Melody Spurling., MD 08/01/2023, 6:51 AM

## 2023-08-01 NOTE — Brief Op Note (Signed)
 08/01/2023  10:41 AM  PATIENT:  Helen Wells  38 y.o. female  PRE-OPERATIVE DIAGNOSIS:  LEFT RENAL MASS  POST-OPERATIVE DIAGNOSIS:  * No post-op diagnosis entered *  PROCEDURE:  Procedure(s) with comments: XI ROBOTIC ASSITED LEFT PARTIAL NEPHRECTOMY WITH ULTRASOUND (Left) - 180 MINUTES  SURGEON:  Surgeons and Role:    * Manny, Harvey Linen., MD - Primary  PHYSICIAN ASSISTANT:   ASSISTANTS: Carrolyn Clan PA   ANESTHESIA:   local and general  EBL:  20 mL   BLOOD ADMINISTERED:none  DRAINS:  JP to bulb; Foley to gravity    LOCAL MEDICATIONS USED:  MARCAINE      SPECIMEN:  Source of Specimen:  left renal mass, deep margin  DISPOSITION OF SPECIMEN:  PATHOLOGY  COUNTS:  YES  TOURNIQUET:  * No tourniquets in log *  DICTATION: .Other Dictation: Dictation Number   [id number]  PLAN OF CARE: Admit for overnight observation  PATIENT DISPOSITION:  PACU - hemodynamically stable.   Delay start of Pharmacological VTE agent (>24hrs) due to surgical blood loss or risk of bleeding: yes

## 2023-08-01 NOTE — Op Note (Signed)
NAME: Helen Wells, ISBILL MEDICAL RECORD NO: [id number] ACCOUNT NO: [id number] DATE OF BIRTH: 02-Oct-1985 FACILITY: [facility] LOCATION: [facility] PHYSICIAN: Sebastian Ache, MD  Operative Report   DATE OF PROCEDURE: 08/01/2023  SURGEON:  Sebastian Ache, MD.  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  PROCEDURE PERFORMED: 1.  Robot-assisted laparoscopic left partial nephrectomy. 2.  Intraoperative ultrasound with interpretation.  ESTIMATED BLOOD LOSS: 20 mL.  COMPLICATIONS:  None.  SPECIMENS: 1.  Left renal mass. 2.  Deep final margin of mass.  FINDINGS: 1.  Single artery, single vein, left renal vascular anatomy with prominent lumbar vein as anticipated. 2.  Predominantly exophytic fatty tumor of the left kidney most consistent with angiomyolipoma interface area with kidney approximately 2 cm as anticipated.  Enucleation technique used at deep aspect.  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  Given her young age and risks of potential bleeding from this, we counseled the patient towards definitive management  considering embolization versus surgery.  She wished to proceed with partial nephrectomy.  Informed consent was obtained and placed in medical record.  PROCEDURE IN DETAIL:  The patient being Helen Wells being verified and procedure being left robotic partial nephrectomy was confirmed.  Procedure timeout was performed.  Intravenous antibiotics were administered.  General endotracheal anesthesia  very carefully introduced, taking  exquisite care to avoid any hypertension given her history of intracranial hypertension.  Foley catheter was placed per urethra to straight drain.  She was placed in the left side up full flank position pulling 15  degrees of table flexion, superior arm elevator, axillary roll, sequential compression devices, bottom leg bent, top leg straight.  Bean bag was deployed.  She was further fastened to the operating table using 3-inch tape over foam padding across the  supraxiphoid chest and her pelvis.  Sterile field was created, prepped and draped the patient's entire abdomen and left flank using chlorhexidine gluconate and high flow low pressure pneumoperitoneum was obtained using Veress technique in the left lower  quadrant having passed the aspiration and drop test.  Next, an 8-mm right camera port was placed into position approximately 2 handbreadth superior lateral to the umbilicus.  Laparoscopic examination of peritoneal cavity revealed no significant adhesions  and no visceral injury.  Additional ports were placed as follows:  Left subcostal ____ mm robotic port, left far lateral 8 mm robotic port, approximately four fingerbreadths superomedial to the anterior iliac spine, left paramedian inferior robotic  port, approximately 1-1/2 hand breadth superior to the pubic ramus and two 12 mm assistant port sites in the midline, one approximately two fingerbreadths superior to plane of the camera port and one approximately two fingerbreadths inferior to plane of  the camera port.  Superior most one being AirSeal type.  Robot was docked and passed the electronic checks.  Next, attention was directed at development of retroperitoneum.  Incision made lateral to the descending colon from the area of the splenic  flexure towards the internal ring.  The colon was carefully swept medially.  Lateral splenic attachments were taken down allowing the spleen to rotate medially with the pancreas en bloc, allowing this to  rotate away from  the anterior superior surface of  the Gerota's fascia.  The lower pole of the kidney area was identified and placed on gentle lateral traction.  Dissection was proceeded medial to this.  The ureter and gonadal vessels were encountered and also placed on gentle lateral traction.  The  aorta and psoas musculature were visualized, the medial aspect of the plane and this plane was developed towards the area of the renal hilum.  The renal hilum consisted of a single artery, single relatively early branching vein and renal vascular  anatomy. There was a significant lumbar vessel emanating from the renal vein that did obscure somewhat access to the renal artery.  This lumbar vessel was controlled using a Hem-o-lok clip proximal and distal x2 resulting in a much better window to the  other artery.  The artery was circumferentially mobilized and marked with the vessel loop.  Attention was then directed to identification of the mass.  Dissection proceeded directly onto the anterior surface of the kidney at the superior pole and the  area of fatty tumor was clearly visualized.  Fortunately, most of this was separable from the kidney.  This was circumferentially mobilized.  It was at least 6 cm anticipated.  At its inferomedial aspect of the mass, there was an interface with the  kidney as anticipated.  The interface area was approximately 2 cm.  It was unclear at this point, the depth, the penetration of the kidney, therefore, drop-in probe ultrasound was used. The mass and kidney interface was interrogated and it was found that  the mass protruded in the kidney just a matter of millimeters, certainly less than a centimeter with no obvious abutting of the collecting system.  It was felt that a true partial nephrectomy would be warranted.  Warm ischemia was then achieved placing  two bulldog clamps on the artery and partial nephrectomy was performed of the interface area.  The deep aspect of this  enucleation technique was used as the fatty interface was clearly separable away from the true renal parenchyma.  This completely freed  up the left partial nephrectomy, specimen was placed in EndoCatch bag for later retrieval.  Given the enucleation technique, a separate deep margin was set aside for permanent pathology.  Renorrhaphy was then performed placing a Surgicel bolster against  the area of renorrhaphy and then two parenchymal approximation sutures of Vicryl sandwiched between Hem-o-loks and Lapra-Tys was applied over the bolster, which resulted in excellent hemostasis and parenchymal apposition.  The artery was unclamped for a  total warm ischemia time of approximately 13 minutes.  The areas of the renorrhaphy were made quite hemostatic and I was very happy with this.  Retroperitoneum was reapproximated using running V-Loc, allowing the descending colon to rest, enter the  kidney again, thus retroperitonealizing the kidney.  A closed suction drain was brought out the previous left lateral most robotic site into the peritoneal cavity.  Robot was then undocked.  Given the significant size of the mass, the decision was made  to extract by connecting the two 12 mm assistant port sites in the midline.  The partial nephrectomy assessment was removed from this and set aside for permanent pathology.  Extraction site was then closed at the level of the fascia using figure-of-eight  PDS x8, followed by approximation of Scarpa's with running Vicryl.  All incision sites were infiltrated with dilute lipolyzed Marcaine and closed at the level of the skin using subcuticular Monocryl followed by Dermabond.  Procedure was then terminated.  The patient tolerated the procedure well.  No immediate periprocedural complications.  The patient was taken to postanesthesia care unit in stable condition.  Plan for observation admission.  Please note, first assistant, Harrie Foreman was crucial for all portions of the  surgery today.  She provided invaluable retraction, suctioning, vascular clipping, vascular clamping, robotic instrument exchange and general first assistance.   NIK D: 08/01/2023 10:50:27 am T: 08/01/2023 12:16:00 pm  JOB: [id number]/ [id number]

## 2023-08-01 NOTE — Anesthesia Procedure Notes (Signed)
 Procedure Name: Intubation Date/Time: 08/01/2023 8:41 AM  Performed by: Alwyn Juba, CRNAPre-anesthesia Checklist: Patient identified, Emergency Drugs available, Suction available, Patient being monitored and Timeout performed Patient Re-evaluated:Patient Re-evaluated prior to induction Oxygen Delivery Method: Circle system utilized Preoxygenation: Pre-oxygenation with 100% oxygen Induction Type: IV induction Ventilation: Mask ventilation without difficulty Laryngoscope Size: Mac and 4 Grade View: Grade I Tube type: Oral Tube size: 7.0 mm Number of attempts: 1 Airway Equipment and Method: Stylet Placement Confirmation: ETT inserted through vocal cords under direct vision, positive ETCO2 and breath sounds checked- equal and bilateral Secured at: 21 cm Tube secured with: Tape Dental Injury: Teeth and Oropharynx as per pre-operative assessment

## 2023-08-02 ENCOUNTER — Encounter (HOSPITAL_COMMUNITY): Payer: Self-pay | Admitting: Urology

## 2023-08-02 ENCOUNTER — Other Ambulatory Visit (HOSPITAL_COMMUNITY): Payer: Self-pay

## 2023-08-02 DIAGNOSIS — D1771 Benign lipomatous neoplasm of kidney: Secondary | ICD-10-CM | POA: Diagnosis not present

## 2023-08-02 DIAGNOSIS — G932 Benign intracranial hypertension: Secondary | ICD-10-CM | POA: Diagnosis not present

## 2023-08-02 LAB — BASIC METABOLIC PANEL
Anion gap: 9 (ref 5–15)
BUN: 10 mg/dL (ref 6–20)
CO2: 15 mmol/L — ABNORMAL LOW (ref 22–32)
Calcium: 8.3 mg/dL — ABNORMAL LOW (ref 8.9–10.3)
Chloride: 107 mmol/L (ref 98–111)
Creatinine, Ser: 1.06 mg/dL — ABNORMAL HIGH (ref 0.44–1.00)
GFR, Estimated: >60 mL/min (ref >60)
Glucose, Bld: 135 mg/dL — ABNORMAL HIGH (ref 70–99)
Potassium: 3.9 mmol/L (ref 3.5–5.1)
Sodium: 131 mmol/L — ABNORMAL LOW (ref 135–145)

## 2023-08-02 LAB — HEMOGLOBIN AND HEMATOCRIT, BLOOD
HCT: 38 % (ref 36.0–46.0)
Hemoglobin: 12.3 g/dL (ref 12.0–15.0)

## 2023-08-02 MED ORDER — LISINOPRIL 20 MG PO TABS
20.0000 mg | ORAL_TABLET | Freq: Every day | ORAL | Status: DC
Start: 2023-08-02 — End: 2023-08-02

## 2023-08-02 NOTE — TOC Transition Note (Signed)
Transition of Care [facility]) - Discharge Note   Patient Details  Name: Helen Wells MRN: [id number] Date of Birth: 1986/05/09  [facility] CM/SW Contact:  Lanier Clam, RN Phone Number: 08/02/2023, 12:26 PM   Clinical Narrative:  d/c home no needs.     Final next level of care: Home/Self Care Barriers to Discharge: No Barriers Identified   Patient Goals and CMS Choice Patient states their goals for this hospitalization and ongoing recovery are:: Home CMS Medicare.gov Compare Post Acute Care list provided to:: Patient Choice offered to / list presented to : Patient [place] ownership interest in [facility].provided to:: Patient    Discharge Placement                       Discharge Plan and Services Additional resources added to the After Visit Summary for     Discharge Planning Services: CM Consult                                 Social Drivers of Health (SDOH) Interventions SDOH Screenings   Food Insecurity: No Food Insecurity (08/01/2023)  Housing: Low Risk  (08/01/2023)  Transportation Needs: No Transportation Needs (08/01/2023)  Utilities: Not At Risk (08/01/2023)  Alcohol Screen: Low Risk  (10/04/2021)  Depression (PHQ2-9): Low Risk  (03/13/2023)  Social Connections: Unknown (11/14/2021)   Received from [facility], [facility]  Stress: Stress Concern Present (06/02/2020)   Received from [facility], [facility]  Tobacco Use: Low Risk  (08/01/2023)     Readmission Risk Interventions     No data to display

## 2023-08-02 NOTE — Plan of Care (Signed)
  Problem: Clinical Measurements: Goal: Will remain free from infection Outcome: Progressing Goal: Diagnostic test results will improve Outcome: Progressing Goal: Respiratory complications will improve Outcome: Progressing Goal: Cardiovascular complication will be avoided Outcome: Progressing   Problem: Activity: Goal: Risk for activity intolerance will decrease Outcome: Progressing   

## 2023-08-02 NOTE — Discharge Summary (Signed)
Date of admission: 08/01/2023  Date of discharge: 08/02/2023  Admission diagnosis: left renal mass  Discharge diagnosis: same  Secondary diagnoses:  Patient Active Problem List   Diagnosis Date Noted   Left renal mass 08/01/2023   Gastroesophageal reflux disease 02/20/2023   Post lumbar puncture headache 07/06/2022   Pseudotumor cerebri 06/19/2022   Chronic migraine w/o aura w/o status migrainosus, not intractable 06/19/2022   GAD (generalized anxiety disorder) 04/13/2022   Localized swelling of both lower extremities 04/07/2022   Hair thinning 04/07/2022   Paragard IUD (intrauterine device) in place since 2021 03/30/2022   Prolonged menstrual cycles 03/30/2022   Lumbar radiculitis 03/23/2022   Primary hypertension 10/04/2021   Iron deficiency anemia 10/04/2021    Procedures performed: Procedure(s): XI ROBOTIC ASSITED LEFT PARTIAL NEPHRECTOMY WITH ULTRASOUND  History and Physical: For full details, please see admission history and physical. Briefly, Helen Wells is a 38 y.o. year old patient with large exophytic left renal mass who underwent left partial nephrectomy on 08/01/23.   Hospital Course: Patient tolerated the procedure well.  She was then transferred to the floor after an uneventful PACU stay.  Her hospital course was uncomplicated.  On POD#1 she had met discharge criteria: was eating a regular diet, was up and ambulating independently,  pain was well controlled, was voiding without a catheter, and was ready to for discharge.   Laboratory values:  Recent Labs    08/01/23 1153 08/02/23 0332  HGB 13.3 12.3  HCT 41.5 38.0   Recent Labs    08/01/23 0655 08/02/23 0332  NA 137 131*  K 4.1 3.9  CL 113* 107  CO2 15* 15*  GLUCOSE 91 135*  BUN 14 10  CREATININE 1.03* 1.06*  CALCIUM 8.7* 8.3*   No results for input(s): "LABPT", "INR" in the last 72 hours. No results for input(s): "LABURIN" in the last 72 hours. Results for orders placed or performed in  visit on 07/12/23  Clostridium Difficile by PCR     Status: None   Collection Time: 07/12/23 11:02 AM   Specimen: STOOL   Stool  Result Value Ref Range Status   Toxigenic C. Difficile by PCR Negative Negative Final  Stool culture     Status: None   Collection Time: 07/12/23 11:02 AM   Specimen: Stool   Stool  Result Value Ref Range Status   Salmonella/Shigella Screen Final report  Final   Stool Culture result 1 (RSASHR) Comment  Final    Comment: No Salmonella or Shigella recovered.   Campylobacter Culture Final report  Final   Stool Culture result 1 (CMPCXR) Comment  Final    Comment: No Campylobacter species isolated.   E coli, Shiga toxin Assay Negative Negative Final    Physical Exam  Gen: NAD Resp: Satting well on RA Card: Regular rate Abd: Soft, appropriately tender, ND, incision clean dry and intact GU: Voing spontaneously  Neuro: Alert   Disposition: Home  Discharge instruction: The patient was instructed to be ambulatory but told to refrain from heavy lifting, strenuous activity, or driving.   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 Other reaction(s): Other Informed by her MD not to take any Tetracyclines. Informed by her MD not to take any Tetracyclines. Blind   Erythromycin Base    Other reaction(s): Other (See Comments) Other    Gluten Meal Diarrhea   Acid reflux/knots in stomach   Macrolides And Ketolides    Other reaction(s): Other (See Comments), Other (See Comments) Blindness, Pseudotumor Cerebri  Blindness, Pseudotumor Cerebri         Medication List     STOP taking these  medications    Slow Fe 142 (45 Fe) MG Tbcr tablet Generic drug: ferrous sulfate ER       TAKE these medications    acetaminophen 500 MG tablet Commonly known as: TYLENOL Take 1,000 mg by mouth every 6 (six) hours as needed (pain.).   acetaZOLAMIDE 250 MG tablet Commonly known as: DIAMOX Take 2 tablets (500 mg total) by mouth 2 (two) times daily. What changed:  how much to take when to take this   amitriptyline 25 MG tablet Commonly known as: ELAVIL Take 1 tablet (25 mg total) by mouth at bedtime.   amLODipine 5 MG tablet Commonly known as: NORVASC Take 1 tablet (5 mg total) by mouth daily. What changed: when to take this   docusate sodium 100 MG capsule Commonly known as: COLACE Take 1 capsule (100 mg total) by mouth 2 (two) times daily.   HYDROcodone-acetaminophen 5-325 MG tablet Commonly known as: Norco Take 1-2 tablets by mouth every 6 (six) hours as needed for moderate pain (pain score 4-6) or severe pain (pain score 7-10).   lisinopril 20 MG tablet Commonly known as: ZESTRIL Take 1 tablet (20 mg total) by mouth daily. What changed: when to take this        Followup:   Follow-up Information     Berneice Heinrich Delbert Phenix., MD Follow up on 08/13/2023.   Specialty: Urology Why: at 11:30 for MD visit and pathology review. Contact information: [address] [place] [address] [phone]

## 2023-08-02 NOTE — Progress Notes (Addendum)
1 Day Post-Op Subjective: The patient is doing well.  No nausea or vomiting. Pain is adequately controlled.  Great UOP. Drain output low.   Objective: Vital signs in last 24 hours: Temp:  [97.9 F (36.6 C)-99.4 F (37.4 C)] 98.6 F (37 C) (01/16 0340) Pulse Rate:  [71-105] 88 (01/16 0340) Resp:  [12-20] 18 (01/16 0340) BP: (108-132)/(66-85) 113/70 (01/16 0340) SpO2:  [95 %-100 %] 100 % (01/16 0340)  Intake/Output from previous day: 01/15 0701 - 01/16 0700 In: 2631.4 [P.O.:600; I.V.:1831.4; IV Piggyback:200] Out: 2200 [Urine:2150; Drains:30; Blood:20] Intake/Output this shift: No intake/output data recorded.  Physical Exam:  General: Alert and oriented. CV: RRR Lungs: Clear bilaterally. GI: Soft, Nondistended. Incisions: Clean, dry, and intact Urine: Clear Extremities: Nontender, no erythema, no edema.  Lab Results: Recent Labs    08/01/23 1153 08/02/23 0332  HGB 13.3 12.3  HCT 41.5 38.0      Assessment/Plan: POD# 1 s/p left partial Nx  1) SL IVF 2) Ambulate, Incentive spirometry 3) Transition to oral pain medication 4) D/C pelvic drain 5) regular diet 6) TOV  7) Plan for likely discharge later today   Jerald Kief, MD, PhD [facility] Resident  [id number] [facility]     LOS: 0 days   Silas Sedam Matthew-Onabanjo 08/02/2023, 7:47 AM

## 2023-08-03 LAB — SURGICAL PATHOLOGY

## 2023-08-23 ENCOUNTER — Other Ambulatory Visit (HOSPITAL_BASED_OUTPATIENT_CLINIC_OR_DEPARTMENT_OTHER): Payer: Self-pay

## 2023-08-24 ENCOUNTER — Other Ambulatory Visit: Payer: Self-pay

## 2023-08-28 ENCOUNTER — Other Ambulatory Visit: Payer: Self-pay | Admitting: Physician Assistant

## 2023-08-28 ENCOUNTER — Other Ambulatory Visit (HOSPITAL_BASED_OUTPATIENT_CLINIC_OR_DEPARTMENT_OTHER): Payer: Self-pay

## 2023-08-28 ENCOUNTER — Encounter: Payer: Self-pay | Admitting: Physician Assistant

## 2023-08-28 ENCOUNTER — Ambulatory Visit (HOSPITAL_BASED_OUTPATIENT_CLINIC_OR_DEPARTMENT_OTHER)
Admission: RE | Admit: 2023-08-28 | Discharge: 2023-08-28 | Disposition: A | Discharge status: Home / Self Care | Payer: Commercial Managed Care - PPO | Source: Ambulatory Visit | Attending: Physician Assistant | Admitting: Physician Assistant

## 2023-08-28 ENCOUNTER — Ambulatory Visit: Payer: Commercial Managed Care - PPO | Admitting: Physician Assistant

## 2023-08-28 ENCOUNTER — Ambulatory Visit: Payer: Self-pay | Admitting: Family Medicine

## 2023-08-28 VITALS — BP 113/79 | HR 105 | Temp 99.1°F | Ht 63.0 in | Wt 222.1 lb

## 2023-08-28 DIAGNOSIS — R051 Acute cough: Secondary | ICD-10-CM | POA: Insufficient documentation

## 2023-08-28 DIAGNOSIS — R6889 Other general symptoms and signs: Secondary | ICD-10-CM | POA: Diagnosis not present

## 2023-08-28 DIAGNOSIS — R062 Wheezing: Secondary | ICD-10-CM | POA: Diagnosis not present

## 2023-08-28 DIAGNOSIS — R059 Cough, unspecified: Secondary | ICD-10-CM | POA: Diagnosis not present

## 2023-08-28 LAB — POCT INFLUENZA A/B
Influenza A, POC: NEGATIVE
Influenza B, POC: NEGATIVE

## 2023-08-28 LAB — POC COVID19 BINAXNOW: SARS Coronavirus 2 Ag: NEGATIVE

## 2023-08-28 MED ORDER — BENZONATATE 100 MG PO CAPS
100.0000 mg | ORAL_CAPSULE | Freq: Two times a day (BID) | ORAL | 0 refills | Status: DC | PRN
  Filled 2023-08-28: qty 20, 10d supply, fill #0

## 2023-08-28 MED ORDER — PREDNISONE 20 MG PO TABS
20.0000 mg | ORAL_TABLET | Freq: Every day | ORAL | 0 refills | Status: AC
  Filled 2023-08-28: qty 5, 5d supply, fill #0

## 2023-08-28 NOTE — Progress Notes (Signed)
Established patient visit   Patient: Helen Wells   DOB: 1986-07-07   38 y.o. Female  MRN: [id number] Visit Date: 08/28/2023  Today's healthcare provider: Alfredia Ferguson, PA-C   Chief Complaint  Patient presents with   Flu like symptoms    Present for about 2 days- congestion, headache, fever. No chills or body aches. Just pain from coughing Did recent have a dry cough after surgery(month ago). States she did have pneumonia before surgery    Subjective    Pt reports she was diagnosed w/ walking pneumonia and treated 05/2023. Since then she has had a persistent dry cough . She does have an albuterol inhaler at home.   Over the last 2 days this cough has worsened, some chest pain, SOB with deep breaths, some dizziness with deep breaths. Fever of 100.73F at home.   Taking tylenol otc   Medications: Outpatient Medications Prior to Visit  Medication Sig   acetaminophen (TYLENOL) 500 MG tablet Take 1,000 mg by mouth every 6 (six) hours as needed (pain.).   acetaZOLAMIDE (DIAMOX) 250 MG tablet Take 2 tablets (500 mg total) by mouth 2 (two) times daily. (Patient taking differently: Take 750 mg by mouth at bedtime.)   amitriptyline (ELAVIL) 25 MG tablet Take 1 tablet (25 mg total) by mouth at bedtime.   amLODipine (NORVASC) 5 MG tablet Take 1 tablet (5 mg total) by mouth daily. (Patient taking differently: Take 5 mg by mouth at bedtime.)   docusate sodium (COLACE) 100 MG capsule Take 1 capsule (100 mg total) by mouth 2 (two) times daily.   lisinopril (ZESTRIL) 20 MG tablet Take 1 tablet (20 mg total) by mouth daily. (Patient taking differently: Take 20 mg by mouth at bedtime.)   [DISCONTINUED] HYDROcodone-acetaminophen (NORCO) 5-325 MG tablet Take 1-2 tablets by mouth every 6 (six) hours as needed for moderate pain (pain score 4-6) or severe pain (pain score 7-10).   No facility-administered medications prior to visit.    Review of Systems  Constitutional:  Positive for  fatigue. Negative for fever.  HENT:  Positive for congestion.   Respiratory:  Positive for cough, shortness of breath and wheezing.   Cardiovascular:  Positive for chest pain. Negative for leg swelling.  Gastrointestinal:  Negative for abdominal pain.  Neurological:  Negative for dizziness and headaches.       Objective    BP 113/79   Pulse (!) 105   Temp 99.1 F (37.3 C) (Oral)   Ht 5\' 3"  (1.6 m)   Wt 222 lb 2 oz (100.8 kg)   SpO2 97%   BMI 39.35 kg/m    Physical Exam Constitutional:      General: She is awake.     Appearance: She is well-developed.  HENT:     Head: Normocephalic.  Eyes:     Conjunctiva/sclera: Conjunctivae normal.  Cardiovascular:     Rate and Rhythm: Regular rhythm. Tachycardia present.     Heart sounds: Normal heart sounds.  Pulmonary:     Effort: Pulmonary effort is normal.     Breath sounds: Examination of the right-lower field reveals wheezing. Wheezing present.  Skin:    General: Skin is warm.  Neurological:     Mental Status: She is alert and oriented to person, place, and time.  Psychiatric:        Attention and Perception: Attention normal.        Mood and Affect: Mood normal.        Speech:  Speech normal.        Behavior: Behavior is cooperative.     Results for orders placed or performed in visit on 08/28/23  POCT Influenza A/B  Result Value Ref Range   Influenza A, POC Negative Negative   Influenza B, POC Negative Negative  POC COVID-19  Result Value Ref Range   SARS Coronavirus 2 Ag Negative Negative    Assessment & Plan    Acute cough -     Benzonatate; Take 1 capsule (100 mg total) by mouth 2 (two) times daily as needed.  Dispense: 20 capsule; Refill: 0 -     DG Chest 2 View  Flu-like symptoms -     POCT Influenza A/B -     POC COVID-19 BinaxNow   Poc flu, covid negative. Rx tessalon for cough, recommending chest xray to r/o pneumonia and further direct therapy Advising pt use albuterol inhaler q 4-6 hours  prn Return if symptoms worsen or fail to improve.       Alfredia Ferguson, PA-C  [facility] [phone] (phone) [phone] (fax)  [facility]

## 2023-08-28 NOTE — Telephone Encounter (Signed)
Copied from CRM [id number]. Topic: Clinical - Red Word Triage >> Aug 28, 2023  8:07 AM [place] A wrote: [facility] that prompted transfer to Nurse Triage: Low-grade fever, congestion in chest, headache (1-10)6   Chief Complaint: cough x 4 weeks, 4 wks post-op partial nephrectomy Symptoms: pain with deep breaths, low grade fever Frequency:constant Pertinent Negatives: Patient denies cardiac history Disposition: [] ED /[] Urgent Care (no appt availability in office) / [x] Appointment(In office/virtual)/ []  [place] [facility]/ [] Home Care/ [] Refused Recommended Disposition /[] [place] [facility]/ []  Follow-up with PCP Additional Notes: Patient reports cough x 4 weeks, getting worse, non-productive. States that her cough is getting worse past 2 days and she is concerned because she recently had surgery 4 weeks ago and developed Pneumonia during that time. Pt endorses low grade fever and now pain when taking deep breaths. Appt scheduled for today at 10a  Reason for Disposition  [1] Continuous (nonstop) coughing interferes with work or school AND [2] no improvement using cough treatment per Care Advice    Dry cough x 4 weeks getting worse, pain with deep breaths, recent surgery  Answer Assessment - Initial Assessment Questions 1. ONSET: "When did the cough begin?"      *No Answer* 2. SEVERITY: "How bad is the cough today?"      *No Answer* 3. SPUTUM: "Describe the color of your sputum" (none, dry cough; clear, white, yellow, green)     Dry cough  4. HEMOPTYSIS: "Are you coughing up any blood?" If so ask: "How much?" (flecks, streaks, tablespoons, etc.)     No   5. DIFFICULTY BREATHING: "Are you having difficulty breathing?" If Yes, ask: "How bad is it?" (e.g., mild, moderate, severe)    - MILD: No SOB at rest, mild SOB with walking, speaks normally in sentences, can lie down, no retractions, pulse < 100.    - MODERATE: SOB at rest, SOB with minimal exertion and prefers to sit, cannot lie down  flat, speaks in phrases, mild retractions, audible wheezing, pulse 100-120.    - SEVERE: Very SOB at rest, speaks in single words, struggling to breathe, sitting hunched forward, retractions, pulse > 120      No difficult breathing, hurts when taking deep breath  6. FEVER: "Do you have a fever?" If Yes, ask: "What is your temperature, how was it measured, and when did it start?"     100.23F  7. CARDIAC HISTORY: "Do you have any history of heart disease?" (e.g., heart attack, congestive heart failure)      No  8. LUNG HISTORY: "Do you have any history of lung disease?"  (e.g., pulmonary embolus, asthma, emphysema)     No  9. PE RISK FACTORS: "Do you have a history of blood clots?" (or: recent major surgery, recent prolonged travel, bedridden)     Recent surgery 4wks ago, partial nephrectomy  10. OTHER SYMPTOMS: "Do you have any other symptoms?" (e.g., runny nose, wheezing, chest pain)       Chest congesion, heavyieness 11. PREGNANCY: "Is there any chance you are pregnant?" "When was your last menstrual period?"       *No Answer* 12. TRAVEL: "Have you traveled out of the country in the last month?" (e.g., travel history, exposures)       *No Answer*  Protocols used: Cough - Acute Non-Productive-A-AH

## 2023-09-17 ENCOUNTER — Other Ambulatory Visit (HOSPITAL_BASED_OUTPATIENT_CLINIC_OR_DEPARTMENT_OTHER): Payer: Self-pay

## 2023-09-20 ENCOUNTER — Ambulatory Visit: Admitting: Physician Assistant

## 2023-09-20 ENCOUNTER — Other Ambulatory Visit (HOSPITAL_BASED_OUTPATIENT_CLINIC_OR_DEPARTMENT_OTHER): Payer: Self-pay

## 2023-09-20 ENCOUNTER — Other Ambulatory Visit: Payer: Self-pay

## 2023-09-20 ENCOUNTER — Ambulatory Visit: Payer: Self-pay | Admitting: Family Medicine

## 2023-09-20 ENCOUNTER — Encounter: Payer: Self-pay | Admitting: Physician Assistant

## 2023-09-20 VITALS — BP 113/78 | HR 91 | Temp 98.0°F | Ht 63.0 in | Wt 219.6 lb

## 2023-09-20 DIAGNOSIS — T3695XA Adverse effect of unspecified systemic antibiotic, initial encounter: Secondary | ICD-10-CM | POA: Diagnosis not present

## 2023-09-20 DIAGNOSIS — R052 Subacute cough: Secondary | ICD-10-CM | POA: Diagnosis not present

## 2023-09-20 DIAGNOSIS — B379 Candidiasis, unspecified: Secondary | ICD-10-CM | POA: Diagnosis not present

## 2023-09-20 DIAGNOSIS — R5383 Other fatigue: Secondary | ICD-10-CM | POA: Diagnosis not present

## 2023-09-20 MED ORDER — FLUCONAZOLE 150 MG PO TABS
150.0000 mg | ORAL_TABLET | Freq: Once | ORAL | 0 refills | Status: AC
  Filled 2023-09-20: qty 2, 2d supply, fill #0

## 2023-09-20 MED ORDER — AMOXICILLIN-POT CLAVULANATE 875-125 MG PO TABS
1.0000 | ORAL_TABLET | Freq: Two times a day (BID) | ORAL | 0 refills | Status: AC
  Filled 2023-09-20: qty 14, 7d supply, fill #0

## 2023-09-20 MED ORDER — BENZONATATE 100 MG PO CAPS
100.0000 mg | ORAL_CAPSULE | Freq: Two times a day (BID) | ORAL | 0 refills | Status: DC | PRN
  Filled 2023-09-20: qty 30, 15d supply, fill #0

## 2023-09-20 NOTE — Progress Notes (Signed)
 Established patient visit   Patient: Helen Wells   DOB: Oct 27, 1985   38 y.o. Female  MRN: [id number] Visit Date: 09/20/2023  Today's healthcare provider: Alfredia Ferguson, PA-C   Cc. Persistent cough, fatigue.  Subjective    Patient was diagnosed and treated for walking pneumonia 11/24.  She had had a dry cough and used albuterol as needed since then, presented to our office, was seen by me 08/28/2023.  It seemed she had another viral illness with acute fever, treated with Tessalon for cough, point-of-care COVID and flu were negative and chest x-ray revealed no recurrent pneumonia.  Today she reports a period where she did feel better but then all her symptoms returned, mainly a dry cough, fatigue.  She is concerned she might be iron deficient and anemic again due to her fatigue.  The cough is causing a significant amount of chest discomfort.  Medications: Outpatient Medications Prior to Visit  Medication Sig   acetaminophen (TYLENOL) 500 MG tablet Take 1,000 mg by mouth every 6 (six) hours as needed (pain.).   acetaZOLAMIDE (DIAMOX) 250 MG tablet Take 2 tablets (500 mg total) by mouth 2 (two) times daily. (Patient taking differently: Take 750 mg by mouth at bedtime.)   amitriptyline (ELAVIL) 25 MG tablet Take 1 tablet (25 mg total) by mouth at bedtime.   amLODipine (NORVASC) 5 MG tablet Take 1 tablet (5 mg total) by mouth daily. (Patient taking differently: Take 5 mg by mouth at bedtime.)   lisinopril (ZESTRIL) 20 MG tablet Take 1 tablet (20 mg total) by mouth daily. (Patient taking differently: Take 20 mg by mouth at bedtime.)   [DISCONTINUED] benzonatate (TESSALON) 100 MG capsule Take 1 capsule (100 mg total) by mouth 2 (two) times daily as needed.   [DISCONTINUED] docusate sodium (COLACE) 100 MG capsule Take 1 capsule (100 mg total) by mouth 2 (two) times daily.   No facility-administered medications prior to visit.    Review of Systems  Constitutional:  Positive  for fatigue. Negative for fever.  Respiratory:  Positive for cough and chest tightness. Negative for shortness of breath.   Cardiovascular:  Negative for chest pain and leg swelling.  Gastrointestinal:  Negative for abdominal pain.  Neurological:  Negative for dizziness and headaches.       Objective    BP 113/78   Pulse 91   Temp 98 F (36.7 C)   Ht 5\' 3"  (1.6 m)   Wt 219 lb 9.6 oz (99.6 kg)   SpO2 100%   BMI 38.90 kg/m    Physical Exam Constitutional:      General: She is awake.     Appearance: She is well-developed.  HENT:     Head: Normocephalic.  Eyes:     Conjunctiva/sclera: Conjunctivae normal.  Cardiovascular:     Rate and Rhythm: Normal rate and regular rhythm.     Heart sounds: Normal heart sounds.  Pulmonary:     Effort: Pulmonary effort is normal.     Breath sounds: Normal breath sounds. No wheezing, rhonchi or rales.  Skin:    General: Skin is warm.  Neurological:     Mental Status: She is alert and oriented to person, place, and time.  Psychiatric:        Attention and Perception: Attention normal.        Mood and Affect: Mood normal.        Speech: Speech normal.        Behavior: Behavior is cooperative.  No results found for any visits on 09/20/23.  Assessment & Plan    Subacute cough -     Amoxicillin-Pot Clavulanate; Take 1 tablet by mouth 2 (two) times daily for 7 days.  Dispense: 14 tablet; Refill: 0 -     Benzonatate; Take 1 capsule (100 mg total) by mouth 2 (two) times daily as needed.  Dispense: 30 capsule; Refill: 0  Other fatigue -     Comprehensive metabolic panel -     CBC with Differential/Platelet -     TSH -     IBC + Ferritin  Antibiotic-induced yeast infection -     Fluconazole; Take 1 tablet (150 mg total) by mouth once for 1 dose. If symptoms persist can take second dose in 72 hours  Dispense: 2 tablet; Refill: 0   Rx Augmentin, patient has contraindications to azithromycin, doxycycline.  Will prescribe Diflucan for  antibiotic related yeast infection.  Fatigue likely secondary to ongoing respiratory symptoms but will check CBC, CMP, TSH and iron panel.  Advised patient if she has no improvement with antibiotic would refer to pulmonology given prolonged nature of illness.  Return if symptoms worsen or fail to improve.       Alfredia Ferguson, PA-C  [facility] [phone] (phone) [phone] (fax)  [facility]

## 2023-09-20 NOTE — Telephone Encounter (Signed)
 Red Word that prompted transfer to Nurse Triage: Patient is experiencing extreme fatigue for the last four days, she is constantly tired and has developed a dry cough.     Chief Complaint: Non-productive cough, fatigue Symptoms: Above Frequency: 4 days Pertinent Negatives: Patient denies fever Disposition: [] ED /[] Urgent Care (no appt availability in office) / [x] Appointment(In office/virtual)/ []  [place] [facility]/ [] Home Care/ [] Refused Recommended Disposition /[] [place] [facility]/ []  Follow-up with PCP Additional Notes: Agrees with appointment.  Reason for Disposition  [1] Continuous (nonstop) coughing interferes with work or school AND [2] no improvement using cough treatment per Care Advice  Answer Assessment - Initial Assessment Questions 1. ONSET: "When did the cough begin?"      4 days 2. SEVERITY: "How bad is the cough today?"      Mild 3. SPUTUM: "Describe the color of your sputum" (none, dry cough; clear, white, yellow, green)     None 4. HEMOPTYSIS: "Are you coughing up any blood?" If so ask: "How much?" (flecks, streaks, tablespoons, etc.)     No 5. DIFFICULTY BREATHING: "Are you having difficulty breathing?" If Yes, ask: "How bad is it?" (e.g., mild, moderate, severe)    - MILD: No SOB at rest, mild SOB with walking, speaks normally in sentences, can lie down, no retractions, pulse < 100.    - MODERATE: SOB at rest, SOB with minimal exertion and prefers to sit, cannot lie down flat, speaks in phrases, mild retractions, audible wheezing, pulse 100-120.    - SEVERE: Very SOB at rest, speaks in single words, struggling to breathe, sitting hunched forward, retractions, pulse > 120      No 6. FEVER: "Do you have a fever?" If Yes, ask: "What is your temperature, how was it measured, and when did it start?"     No 7. CARDIAC HISTORY: "Do you have any history of heart disease?" (e.g., heart attack, congestive heart failure)      No 8. LUNG HISTORY: "Do you have any  history of lung disease?"  (e.g., pulmonary embolus, asthma, emphysema)     No 9. PE RISK FACTORS: "Do you have a history of blood clots?" (or: recent major surgery, recent prolonged travel, bedridden)     Surgery 7 weeks ago 10. OTHER SYMPTOMS: "Do you have any other symptoms?" (e.g., runny nose, wheezing, chest pain)       Fatigue 11. PREGNANCY: "Is there any chance you are pregnant?" "When was your last menstrual period?"       No 12. TRAVEL: "Have you traveled out of the country in the last month?" (e.g., travel history, exposures)       No  Protocols used: Cough - Acute Non-Productive-A-AH

## 2023-09-21 ENCOUNTER — Encounter: Payer: Self-pay | Admitting: Physician Assistant

## 2023-09-21 LAB — CBC WITH DIFFERENTIAL/PLATELET
Basophils Absolute: 0.2 10*3/uL — ABNORMAL HIGH (ref 0.0–0.1)
Basophils Relative: 2.4 % (ref 0.0–3.0)
Eosinophils Absolute: 0.2 10*3/uL (ref 0.0–0.7)
Eosinophils Relative: 2.2 % (ref 0.0–5.0)
HCT: 39.1 % (ref 36.0–46.0)
Hemoglobin: 12.8 g/dL (ref 12.0–15.0)
Lymphocytes Relative: 35 % (ref 12.0–46.0)
Lymphs Abs: 2.6 10*3/uL (ref 0.7–4.0)
MCHC: 32.8 g/dL (ref 30.0–36.0)
MCV: 98.1 fl (ref 78.0–100.0)
Monocytes Absolute: 0.7 10*3/uL (ref 0.1–1.0)
Monocytes Relative: 8.8 % (ref 3.0–12.0)
Neutro Abs: 3.8 10*3/uL (ref 1.4–7.7)
Neutrophils Relative %: 51.6 % (ref 43.0–77.0)
Platelets: 341 10*3/uL (ref 150.0–400.0)
RBC: 3.98 Mil/uL (ref 3.87–5.11)
RDW: 15.5 % (ref 11.5–15.5)
WBC: 7.4 10*3/uL (ref 4.0–10.5)

## 2023-09-21 LAB — COMPREHENSIVE METABOLIC PANEL
ALT: 12 U/L (ref 0–35)
AST: 12 U/L (ref 0–37)
Albumin: 4 g/dL (ref 3.5–5.2)
Alkaline Phosphatase: 84 U/L (ref 39–117)
BUN: 7 mg/dL (ref 6–23)
CO2: 23 meq/L (ref 19–32)
Calcium: 9.2 mg/dL (ref 8.4–10.5)
Chloride: 108 meq/L (ref 96–112)
Creatinine, Ser: 1.01 mg/dL (ref 0.40–1.20)
GFR: 70.72 mL/min (ref >60.00)
Glucose, Bld: 84 mg/dL (ref 70–99)
Potassium: 3.9 meq/L (ref 3.5–5.1)
Sodium: 139 meq/L (ref 135–145)
Total Bilirubin: 0.3 mg/dL (ref 0.2–1.2)
Total Protein: 6.7 g/dL (ref 6.0–8.3)

## 2023-09-21 LAB — IBC + FERRITIN
Ferritin: 13.8 ng/mL (ref 10.0–291.0)
Iron: 31 ug/dL — ABNORMAL LOW (ref 42–145)
Saturation Ratios: 8 % — ABNORMAL LOW (ref 20.0–50.0)
TIBC: 386.4 ug/dL (ref 250.0–450.0)
Transferrin: 276 mg/dL (ref 212.0–360.0)

## 2023-09-21 LAB — TSH: TSH: 2.58 u[IU]/mL (ref 0.35–5.50)

## 2023-09-27 ENCOUNTER — Telehealth: Payer: Self-pay

## 2023-09-27 ENCOUNTER — Other Ambulatory Visit (HOSPITAL_BASED_OUTPATIENT_CLINIC_OR_DEPARTMENT_OTHER): Payer: Self-pay

## 2023-09-27 ENCOUNTER — Other Ambulatory Visit: Payer: Self-pay | Admitting: Physician Assistant

## 2023-09-27 DIAGNOSIS — B379 Candidiasis, unspecified: Secondary | ICD-10-CM

## 2023-09-27 MED ORDER — FLUCONAZOLE 150 MG PO TABS
150.0000 mg | ORAL_TABLET | Freq: Once | ORAL | 0 refills | Status: AC
  Filled 2023-09-27: qty 1, 1d supply, fill #0

## 2023-09-27 NOTE — Telephone Encounter (Signed)
 Mychart message has been sent to pt.

## 2023-09-27 NOTE — Telephone Encounter (Signed)
 Called pt was not able to leave a message due to VM not being set up.

## 2023-09-27 NOTE — Telephone Encounter (Signed)
 Copied from CRM [id number]. Topic: Clinical - Medical Advice >> Sep 27, 2023  8:46 AM Orinda Kenner C wrote: Reason for CRM: Patient states still has a yeast infection and needs another treatment. Please advise and can contact via MyChart.  [facility] - [facility] [address], [address] [place] [address] Phone:[phone]

## 2023-10-02 ENCOUNTER — Other Ambulatory Visit (HOSPITAL_BASED_OUTPATIENT_CLINIC_OR_DEPARTMENT_OTHER): Payer: Self-pay

## 2023-10-02 ENCOUNTER — Ambulatory Visit: Admitting: Obstetrics and Gynecology

## 2023-10-02 ENCOUNTER — Other Ambulatory Visit (HOSPITAL_COMMUNITY)
Admission: RE | Admit: 2023-10-02 | Discharge: 2023-10-02 | Disposition: A | Discharge status: Home / Self Care | Source: Ambulatory Visit | Attending: Obstetrics and Gynecology | Admitting: Obstetrics and Gynecology

## 2023-10-02 VITALS — BP 111/76 | HR 98 | Wt 217.0 lb

## 2023-10-02 DIAGNOSIS — N898 Other specified noninflammatory disorders of vagina: Secondary | ICD-10-CM

## 2023-10-02 DIAGNOSIS — N760 Acute vaginitis: Secondary | ICD-10-CM

## 2023-10-02 MED ORDER — NYSTATIN-TRIAMCINOLONE 100000-0.1 UNIT/GM-% EX OINT
1.0000 | TOPICAL_OINTMENT | Freq: Two times a day (BID) | CUTANEOUS | 0 refills | Status: DC
Start: 2023-10-02 — End: 2023-12-05
  Filled 2023-10-02: qty 30, 60d supply, fill #0

## 2023-10-02 NOTE — Progress Notes (Signed)
   GYNECOLOGY PROGRESS NOTE  History:  38 y.o. G2P2000 presents to [facility] office today for problem gyn visit. She reports being on an abx, she was given diflucan, reports the discharge has subsided,  cleared and no longer itching, but she reports she is having external irritation. She also has cloudy urine, she has a hx of partial nephrectomy.  The following portions of the patient's history were reviewed and updated as appropriate: allergies, current medications, past family history, past medical history, past social history, past surgical history and problem list. Last pap smear on 03/30/2022 was normal, neg HRHPV.  Health Maintenance Due  Topic Date Due   COVID-19 Vaccine (3 - Pfizer risk series) 11/12/2019     Review of Systems:  Pertinent items are noted in HPI.   Objective:  Physical Exam Blood pressure 111/76, pulse 98, weight 217 lb (98.4 kg), last menstrual period 09/17/2023. VS reviewed, nursing note reviewed,  Constitutional: well developed, well nourished, no distress HEENT: normocephalic Pulm/chest wall: normal effort Abdomen: soft Neuro: alert and oriented  Skin: warm, dry Psych: affect normal Pelvic exam: Cervix pink, visually closed, without lesion, scant white creamy discharge, vaginal walls and external genitalia normal. Erythematous patch noted  on vulva  Assessment & Plan:  1. Vaginal irritation (Primary) Pending vaginal swab to determine treatment  - Cervicovaginal ancillary only( [place]) - POCT Urinalysis Dipstick - Urine Culture - nystatin-triamcinolone ointment (MYCOLOG); Apply to the affected area(s) topically 2 (two) times daily.  Dispense: 30 g; Refill: 0  2. Acute vaginitis  - nystatin-triamcinolone ointment (MYCOLOG); Apply to the affected area(s) topically 2 (two) times daily.  Dispense: 30 g; Refill: 0   Future Appointments  Date Time Provider Department Center  10/30/2023  2:10 PM Sue Lush, FNP [facility] None  03/14/2024  10:00 AM Wendling, [name], DO [facility]     Albertine Grates, FNP 2:17 PM

## 2023-10-03 ENCOUNTER — Other Ambulatory Visit (HOSPITAL_BASED_OUTPATIENT_CLINIC_OR_DEPARTMENT_OTHER): Payer: Self-pay

## 2023-10-04 ENCOUNTER — Other Ambulatory Visit (HOSPITAL_BASED_OUTPATIENT_CLINIC_OR_DEPARTMENT_OTHER): Payer: Self-pay

## 2023-10-04 LAB — CERVICOVAGINAL ANCILLARY ONLY
Bacterial Vaginitis (gardnerella): POSITIVE — AB
Candida Glabrata: NEGATIVE
Candida Vaginitis: NEGATIVE
Chlamydia: NEGATIVE
Comment: NEGATIVE
Comment: NEGATIVE
Comment: NEGATIVE
Comment: NEGATIVE
Comment: NEGATIVE
Comment: NORMAL
Neisseria Gonorrhea: NEGATIVE
Trichomonas: NEGATIVE

## 2023-10-04 LAB — URINE CULTURE: Organism ID, Bacteria: NO GROWTH

## 2023-10-04 MED ORDER — METRONIDAZOLE 500 MG PO TABS
500.0000 mg | ORAL_TABLET | Freq: Two times a day (BID) | ORAL | 0 refills | Status: AC
  Filled 2023-10-04: qty 14, 7d supply, fill #0

## 2023-10-06 ENCOUNTER — Encounter: Payer: Self-pay | Admitting: Obstetrics and Gynecology

## 2023-10-08 ENCOUNTER — Telehealth: Payer: Self-pay | Admitting: Neurology

## 2023-10-08 ENCOUNTER — Other Ambulatory Visit: Payer: Self-pay | Admitting: Physician Assistant

## 2023-10-08 DIAGNOSIS — R052 Subacute cough: Secondary | ICD-10-CM

## 2023-10-08 NOTE — Telephone Encounter (Signed)
 Referral placed.

## 2023-10-08 NOTE — Telephone Encounter (Signed)
 Copied from CRM [id number]. Topic: Clinical - Medical Advice >> Oct 08, 2023  2:40 PM Almira Coaster wrote: Reason for CRM: Patient was seen on 09/20/2023 with Helen Wells, Per patient Helen Wells asked patient to call in if symptoms don't improve or worse and she would be referred to a Pulmonologist. Patient states symptoms have not improved but haven't worsen either. She would like to see the specialist.

## 2023-10-16 ENCOUNTER — Telehealth: Payer: Self-pay | Admitting: Neurology

## 2023-10-16 NOTE — Telephone Encounter (Signed)
 Call to patient after mychart message requesting spinal tap for symptoms.  she reports having swelling in legs, increased BP, blurry vision and headaches since December. She reports these being her original symptoms that improved but have returned. She had been able to decrease from 4 ( 250mg  each ) diamox daily to 3 but now is back to 4 diamox tablets daily. Discussed with Dr. Terrace Arabia and was advised have patient seen at Dr. Dione Booze office. Called to Dr. Dione Booze office and they were able to move appointment up to 4/22. Patient aware and appreciative. Dr Terrace Arabia made aware.

## 2023-10-16 NOTE — Telephone Encounter (Signed)
 Pt said, having symptoms that need to have a spinal tap for. Do not have a scheduled appt. Need to know what need to do. Would like a call back.

## 2023-10-30 ENCOUNTER — Ambulatory Visit: Admitting: Obstetrics and Gynecology

## 2023-11-02 ENCOUNTER — Other Ambulatory Visit (HOSPITAL_BASED_OUTPATIENT_CLINIC_OR_DEPARTMENT_OTHER): Payer: Self-pay

## 2023-11-06 DIAGNOSIS — R519 Headache, unspecified: Secondary | ICD-10-CM | POA: Diagnosis not present

## 2023-11-06 DIAGNOSIS — H4710 Unspecified papilledema: Secondary | ICD-10-CM | POA: Diagnosis not present

## 2023-11-06 DIAGNOSIS — G932 Benign intracranial hypertension: Secondary | ICD-10-CM | POA: Diagnosis not present

## 2023-11-06 DIAGNOSIS — E236 Other disorders of pituitary gland: Secondary | ICD-10-CM | POA: Diagnosis not present

## 2023-11-07 ENCOUNTER — Ambulatory Visit: Admitting: Obstetrics & Gynecology

## 2023-11-15 ENCOUNTER — Ambulatory Visit: Admitting: Family Medicine

## 2023-11-15 ENCOUNTER — Other Ambulatory Visit (HOSPITAL_COMMUNITY)
Admission: RE | Admit: 2023-11-15 | Discharge: 2023-11-15 | Disposition: A | Discharge status: Home / Self Care | Source: Ambulatory Visit | Attending: Family Medicine | Admitting: Family Medicine

## 2023-11-15 VITALS — BP 121/81 | HR 97 | Ht 63.0 in | Wt 221.0 lb

## 2023-11-15 DIAGNOSIS — N920 Excessive and frequent menstruation with regular cycle: Secondary | ICD-10-CM

## 2023-11-15 DIAGNOSIS — Z30431 Encounter for routine checking of intrauterine contraceptive device: Secondary | ICD-10-CM

## 2023-11-15 DIAGNOSIS — Z01419 Encounter for gynecological examination (general) (routine) without abnormal findings: Secondary | ICD-10-CM

## 2023-11-15 DIAGNOSIS — N898 Other specified noninflammatory disorders of vagina: Secondary | ICD-10-CM

## 2023-11-15 DIAGNOSIS — R350 Frequency of micturition: Secondary | ICD-10-CM

## 2023-11-15 LAB — POCT URINALYSIS DIPSTICK
Bilirubin, UA: NEGATIVE
Blood, UA: NEGATIVE
Glucose, UA: NEGATIVE
Ketones, UA: NEGATIVE
Leukocytes, UA: NEGATIVE
Nitrite, UA: NEGATIVE
Protein, UA: NEGATIVE
Spec Grav, UA: 1.01 (ref 1.010–1.025)
Urobilinogen, UA: NEGATIVE U/dL — AB
pH, UA: 5 (ref 5.0–8.0)

## 2023-11-17 LAB — CBC
Hematocrit: 40.1 % (ref 34.0–46.6)
Hemoglobin: 13 g/dL (ref 11.1–15.9)
MCH: 30.6 pg (ref 26.6–33.0)
MCHC: 32.4 g/dL (ref 31.5–35.7)
MCV: 94 fL (ref 79–97)
Platelets: 385 10*3/uL (ref 150–450)
RBC: 4.25 x10E6/uL (ref 3.77–5.28)
RDW: 14.6 % (ref 11.7–15.4)
WBC: 10.5 10*3/uL (ref 3.4–10.8)

## 2023-11-17 LAB — IRON AND TIBC
Iron Saturation: 15 % (ref 15–55)
Iron: 60 ug/dL (ref 27–159)
Total Iron Binding Capacity: 395 ug/dL (ref 250–450)
UIBC: 335 ug/dL (ref 131–425)

## 2023-11-17 LAB — FERRITIN: Ferritin: 15 ng/mL (ref 15–150)

## 2023-11-17 LAB — VITAMIN B12: Vitamin B-12: 289 pg/mL (ref 232–1245)

## 2023-11-17 LAB — FOLATE: Folate: <2 ng/mL — ABNORMAL LOW (ref >3.0)

## 2023-11-17 LAB — TSH RFX ON ABNORMAL TO FREE T4: TSH: 3.5 u[IU]/mL (ref 0.450–4.500)

## 2023-11-19 ENCOUNTER — Other Ambulatory Visit: Payer: Self-pay

## 2023-11-19 ENCOUNTER — Other Ambulatory Visit (HOSPITAL_BASED_OUTPATIENT_CLINIC_OR_DEPARTMENT_OTHER): Payer: Self-pay

## 2023-11-19 DIAGNOSIS — N760 Acute vaginitis: Secondary | ICD-10-CM

## 2023-11-19 LAB — CERVICOVAGINAL ANCILLARY ONLY
Bacterial Vaginitis (gardnerella): POSITIVE — AB
Candida Glabrata: NEGATIVE
Candida Vaginitis: NEGATIVE
Comment: NEGATIVE
Comment: NEGATIVE
Comment: NEGATIVE

## 2023-11-19 MED ORDER — METRONIDAZOLE 500 MG PO TABS
500.0000 mg | ORAL_TABLET | Freq: Two times a day (BID) | ORAL | 0 refills | Status: DC
  Filled 2023-11-19: qty 14, 7d supply, fill #0

## 2023-11-20 ENCOUNTER — Encounter: Payer: Self-pay | Admitting: Family Medicine

## 2023-11-20 NOTE — Progress Notes (Unsigned)
 ANNUAL EXAM Patient name: Helen Wells MRN [id number]  Date of birth: 11-12-1985 Chief Complaint:   Annual Exam  History of Present Illness:   Helen Wells is a 38 y.o.  G80P2000  female  being seen today for a routine annual exam.  Current complaints: heavy periods on paragard IUD. Does not want hormonal IUD - hormones, even hormonal iud made her BP increase dramatically. Does feel tired and exhausted during her periods. She does wonder if she is anemic.  Patient's last menstrual period was 11/01/2023 (exact date).    Last pap 03/2022. Results were: NILM w/ HRHPV negative. H/O abnormal pap: no Last mammogram: n/a Last colonoscopy: n/a     11/15/2023    2:22 PM 03/13/2023    9:56 AM 01/23/2023    3:06 PM 11/24/2022    1:03 PM 10/31/2022   12:29 PM  Depression screen PHQ 2/9  Decreased Interest 1 0 0 0 0  Down, Depressed, Hopeless 1 0 0 0 0  PHQ - 2 Score 2 0 0 0 0  Altered sleeping 2 0     Tired, decreased energy 2 0     Change in appetite 1 0     Feeling bad or failure about yourself  0 0     Trouble concentrating 0 0     Moving slowly or fidgety/restless 0 0     Suicidal thoughts 0 0     PHQ-9 Score 7 0     Difficult doing work/chores  Not difficult at all           11/15/2023    2:22 PM 04/13/2022    8:59 AM  GAD 7 : Generalized Anxiety Score  Nervous, Anxious, on Edge 1 3  Control/stop worrying 1 2  Worry too much - different things 1 2  Trouble relaxing 0 3  Restless 0 2  Easily annoyed or irritable 1 1  Afraid - awful might happen 0 1  Total GAD 7 Score 4 14  Anxiety Difficulty  Somewhat difficult     Review of Systems:   Pertinent items are noted in HPI Denies any headaches, blurred vision, fatigue, shortness of breath, chest pain, abdominal pain, abnormal vaginal discharge/itching/odor/irritation, problems with periods, bowel movements, urination, or intercourse unless otherwise stated above. Pertinent History Reviewed:  Reviewed past  medical,surgical, social and family history.  Reviewed problem list, medications and allergies. Physical Assessment:   Vitals:   11/15/23 1410  BP: 121/81  Pulse: 97  Weight: 221 lb (100.2 kg)  Height: 5\' 3"  (1.6 m)  Body mass index is 39.15 kg/m.        Physical Examination:   General appearance - well appearing, and in no distress  Mental status - alert, oriented to person, place, and time  Psych:  She has a normal mood and affect  Skin - warm and dry, normal color, no suspicious lesions noted  Chest - effort normal, all lung fields clear to auscultation bilaterally  Heart - normal rate and regular rhythm  Neck:  midline trachea, no thyromegaly or nodules  Breasts - breasts appear normal, no suspicious masses, no skin or nipple changes or axillary nodes  Abdomen - soft, nontender, nondistended, no masses or organomegaly  Pelvic - VULVA: normal appearing vulva with no masses, tenderness or lesions  VAGINA: normal appearing vagina with normal color and discharge, no lesions  CERVIX: normal appearing cervix without discharge or lesions, no CMT  Thin prep pap is not done  IUD strings visualized  UTERUS: uterus is felt to be normal size, shape, consistency and nontender   ADNEXA: No adnexal masses or tenderness noted.  Extremities:  No swelling or varicosities noted  Chaperone present for exam  Assessment & Plan:  1. Urine frequency (Primary) - POCT Urinalysis Dipstick  2. Menorrhagia with regular cycle - Vitamin B12 - TSH Rfx on Abnormal to Free T4 - CBC - Ferritin - Folate - Iron and TIBC  3. Well woman exam with routine gynecological exam  4. Vaginal discharge - Cervicovaginal ancillary only  5. IUD check up IUD strings seen   Labs/procedures today:   Orders Placed This Encounter  Procedures   Vitamin B12   TSH Rfx on Abnormal to Free T4   CBC   Ferritin   Folate   Iron and TIBC   TSH Rfx on Abnormal to Free T4   Vitamin B12   POCT Urinalysis Dipstick     Meds: No orders of the defined types were placed in this encounter.   Follow-up: No follow-ups on file.  Keyshia Orwick J Jerelyn Trimarco, DO 11/20/2023 3:35 PM

## 2023-11-23 ENCOUNTER — Other Ambulatory Visit (HOSPITAL_BASED_OUTPATIENT_CLINIC_OR_DEPARTMENT_OTHER): Payer: Self-pay

## 2023-11-23 DIAGNOSIS — D1771 Benign lipomatous neoplasm of kidney: Secondary | ICD-10-CM | POA: Diagnosis not present

## 2023-11-23 DIAGNOSIS — R8271 Bacteriuria: Secondary | ICD-10-CM | POA: Diagnosis not present

## 2023-11-23 DIAGNOSIS — L910 Hypertrophic scar: Secondary | ICD-10-CM | POA: Diagnosis not present

## 2023-11-23 MED ORDER — TRIAMCINOLONE ACETONIDE 0.025 % EX CREA
1.0000 | TOPICAL_CREAM | Freq: Every day | CUTANEOUS | 0 refills | Status: AC
Start: 2023-11-23 — End: 2023-12-30
  Filled 2023-11-23: qty 45, 45d supply, fill #0

## 2023-11-30 ENCOUNTER — Telehealth: Payer: Self-pay | Admitting: Neurology

## 2023-11-30 NOTE — Telephone Encounter (Signed)
 Pt states she has had her eye exam but she has not heard form anyone, pt is asking to be called about the needed spinal tap

## 2023-12-04 ENCOUNTER — Ambulatory Visit: Admitting: Neurology

## 2023-12-04 ENCOUNTER — Telehealth: Payer: Self-pay | Admitting: Neurology

## 2023-12-04 ENCOUNTER — Telehealth: Payer: Self-pay

## 2023-12-04 NOTE — Telephone Encounter (Signed)
 Helen Wells

## 2023-12-04 NOTE — Telephone Encounter (Signed)
 Received the feedback from ophthalmologist Dr. 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  Please Schedule her with NP with her next available.

## 2023-12-04 NOTE — Telephone Encounter (Signed)
 Call to patient, advised per Dr. Gracie Lav no spinal tap was indicated based on report form DR. Groat office. Patient agreeable to see Isa Manuel NP this afternoon to discuss symptoms.
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
## 2023-12-05 ENCOUNTER — Other Ambulatory Visit (HOSPITAL_BASED_OUTPATIENT_CLINIC_OR_DEPARTMENT_OTHER): Payer: Self-pay

## 2023-12-05 ENCOUNTER — Ambulatory Visit: Admitting: Internal Medicine

## 2023-12-05 ENCOUNTER — Ambulatory Visit: Admitting: Family Medicine

## 2023-12-05 ENCOUNTER — Encounter: Payer: Self-pay | Admitting: Internal Medicine

## 2023-12-05 ENCOUNTER — Encounter: Payer: Self-pay | Admitting: Family Medicine

## 2023-12-05 VITALS — BP 94/60 | HR 70 | Temp 97.8°F | Ht 63.0 in | Wt 220.8 lb

## 2023-12-05 VITALS — BP 100/70 | HR 98 | Temp 98.0°F | Resp 16 | Ht 63.0 in | Wt 220.0 lb

## 2023-12-05 DIAGNOSIS — I10 Essential (primary) hypertension: Secondary | ICD-10-CM | POA: Diagnosis not present

## 2023-12-05 DIAGNOSIS — R053 Chronic cough: Secondary | ICD-10-CM

## 2023-12-05 DIAGNOSIS — R0602 Shortness of breath: Secondary | ICD-10-CM

## 2023-12-05 DIAGNOSIS — R071 Chest pain on breathing: Secondary | ICD-10-CM | POA: Diagnosis not present

## 2023-12-05 DIAGNOSIS — G932 Benign intracranial hypertension: Secondary | ICD-10-CM | POA: Diagnosis not present

## 2023-12-05 LAB — NITRIC OXIDE: Nitric Oxide: 16

## 2023-12-05 MED ORDER — FLUTICASONE-SALMETEROL 100-50 MCG/ACT IN AEPB
1.0000 | INHALATION_SPRAY | Freq: Two times a day (BID) | RESPIRATORY_TRACT | 5 refills | Status: DC
  Filled 2023-12-05: qty 60, 30d supply, fill #0
  Filled 2023-12-28 – 2024-01-11 (×3): qty 60, 30d supply, fill #1
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
## 2023-12-05 NOTE — Patient Instructions (Signed)
 It was a pleasure to see you today!  Please schedule follow up with myself in 2 months.  If my schedule is not open yet, we will contact you with a reminder closer to that time. Please call [phone] if you haven't heard from us  a month before, and always call us  sooner if issues or concerns arise. You can also send us  a message through MyChart, but but aware that this is not to be used for urgent issues and it may take up to 5-7 days to receive a reply. Please be aware that you will likely be able to view your results before I have a chance to respond to them. Please give us  5 business days to respond to any non-urgent results.   VISIT SUMMARY:  Today, you were seen for persistent chest pain and cough that have been ongoing since your pneumonia diagnosis in November. Despite treatment, your symptoms have persisted, and you have experienced pain in your lower right lung, especially after physical activity and deep breaths. We discussed your history of high blood pressure and idiopathic intracranial hypertension (IIH), and how these conditions are currently being managed.  YOUR PLAN:  -ASTHMA: Asthma is a condition where your airways narrow and swell, causing difficulty in breathing. Your chest pain and cough, which worsen with exertion, suggest asthma. You should continue using your albuterol  inhaler as needed. We will also start you on Wixela 100 mcg once daily in the morning. If there is no improvement after two weeks, increase the dose to twice daily. A pulmonary function test has been ordered to further evaluate your lung function. Please remember to gargle after using the inhaler to prevent thrush.  -IDIOPATHIC INTRACRANIAL HYPERTENSION (IIH): IIH is a condition where there is increased pressure around your brain, which can cause headaches and vision problems. You are currently managing this with Diamox  taken at night to avoid nausea. The risk of exacerbation with inhaled steroids is low.

## 2023-12-05 NOTE — Patient Instructions (Addendum)
 Please don't get pregnant on these medications.   Keep the diet clean and stay active.  Stay hydrated.   Stay on the lisinopril   Check your blood pressures 2-3 times per week, alternating the time of day you check it. If it is high, considering waiting 1-2 minutes and rechecking. If it gets higher, your anxiety is likely creeping up and we should avoid rechecking.   Let us  know if you need anything.

## 2023-12-05 NOTE — Progress Notes (Signed)
 Chief Complaint  Patient presents with   Hypertension    BP    Subjective Helen Wells is a 38 y.o. female who presents for hypertension follow up. She does monitor home blood pressures. Blood pressures ranging from 90-100's/60-70's on average. Blood pressure started to drop a few weeks ago after her neurologist increased her Diamox  dosing.  This happened last year as well. She is compliant with medication- lisinopril  20 mg/d, Norvasc  5 mg/d. Patient has these side effects of medication: none She is not always adhering to a healthy diet overall. Current exercise: walking No CP or SOB.    Past Medical History:  Diagnosis Date   ADHD (attention deficit hyperactivity disorder)    Anemia    Anemia    Anxiety    Clotting disorder (HCC)    Headache    Hyperlipemia    Hypertension    Hypertension     Exam BP 100/70 (BP Location: Left Arm, Patient Position: Sitting)   Pulse 98   Temp 98 F (36.7 C) (Oral)   Resp 16   Ht 5\' 3"  (1.6 m)   Wt 220 lb (99.8 kg)   LMP 11/01/2023 (Exact Date)   SpO2 100%   BMI 38.97 kg/m  General:  well developed, well nourished, in no apparent distress Heart: RRR, no bruits, no LE edema Lungs: clear to auscultation, no accessory muscle use Psych: well oriented with normal range of affect and appropriate judgment/insight  Primary hypertension  IIH (idiopathic intracranial hypertension) - Plan: Ambulatory referral to Neurology  Chronic issue that is not adequately controlled.  We will decrease her medication.  Continue lisinopril  20 mg daily, drop amlodipine  5 mg daily.  Monitor blood pressure at home several times per week.  Send message if still low or if it goes too high.  Counseled on diet and exercise. She is requesting a second opinion from another neurologist.  She has a history of IIH which seems to be resolved when she has a lumbar puncture.  She reports having similar symptoms and her neurologist referred her to [facility] saying she  could not do anything any further.  Those appointments with cholesterol $125 per month and she cannot afford this.  Will refer to Dr. Festus Hubert of the [facility] neurology team for his help. F/u as originally scheduled or as needed. The patient voiced understanding and agreement to the plan.  [name] [place], DO 12/05/23  3:47 PM

## 2023-12-06 ENCOUNTER — Encounter: Payer: Self-pay | Admitting: Neurology
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
## 2023-12-19 ENCOUNTER — Encounter: Payer: Self-pay | Admitting: Adult Health

## 2023-12-19 ENCOUNTER — Ambulatory Visit: Admitting: Adult Health

## 2023-12-21 ENCOUNTER — Other Ambulatory Visit (HOSPITAL_BASED_OUTPATIENT_CLINIC_OR_DEPARTMENT_OTHER): Payer: Self-pay

## 2023-12-28 ENCOUNTER — Other Ambulatory Visit (HOSPITAL_BASED_OUTPATIENT_CLINIC_OR_DEPARTMENT_OTHER): Payer: Self-pay

## 2024-01-10 ENCOUNTER — Other Ambulatory Visit (HOSPITAL_BASED_OUTPATIENT_CLINIC_OR_DEPARTMENT_OTHER): Payer: Self-pay

## 2024-01-10 ENCOUNTER — Other Ambulatory Visit: Payer: Self-pay | Admitting: Family Medicine

## 2024-01-10 ENCOUNTER — Other Ambulatory Visit: Payer: Self-pay | Admitting: Neurology

## 2024-01-10 MED ORDER — LISINOPRIL 20 MG PO TABS
20.0000 mg | ORAL_TABLET | Freq: Every day | ORAL | 1 refills | Status: DC
  Filled 2024-01-10 – 2024-02-18 (×2): qty 90, 90d supply, fill #0
  Filled 2024-05-19: qty 90, 90d supply, fill #1

## 2024-01-10 NOTE — Telephone Encounter (Signed)
 Refusing due to last note:  Patient continue complains of intermittent blurry vision, that was helped by a higher dose of Diamox  from 250 twice a day to 500 mg twice a day, but at the same time she reported increased headache with higher dose of Diamox , also complains side effect of itching, numbness of fingertips, I have offered multiple treatment options, including CGRP antagonist, Botox injection as migraine prevention, she does not want to proceed,

## 2024-01-14 ENCOUNTER — Ambulatory Visit: Payer: Self-pay

## 2024-01-14 ENCOUNTER — Other Ambulatory Visit (HOSPITAL_BASED_OUTPATIENT_CLINIC_OR_DEPARTMENT_OTHER): Payer: Self-pay

## 2024-01-14 NOTE — Telephone Encounter (Signed)
 FYI pt scheduled to see you 7/1.

## 2024-01-14 NOTE — Telephone Encounter (Signed)
 FYI Only or Action Required?: FYI only for provider.  Patient is followed in Pulmonology for SOB, last seen on 12/05/2023 by Meade Verdon RAMAN, MD. Called Nurse Triage reporting Breathing Problem. Symptoms began several weeks ago. Interventions attempted: Maintenance inhaler. Symptoms are: unchanged.  Triage Disposition: See Physician Within 24 Hours  Patient/caregiver understands and will follow disposition?: Yes    Copied from CRM [id number]. Topic: Clinical - Red Word Triage >> Jan 14, 2024 12:33 PM Helen Wells wrote: Red Word that prompted transfer to Nurse Triage: Severe lung pain and fatigue. Reason for Disposition  [1] MODERATE longstanding difficulty breathing (e.g., speaks in phrases, SOB even at rest, pulse 100-120) AND [2] SAME as normal  Answer Assessment - Initial Assessment Questions E2C2 Pulmonary Triage - Initial Assessment Questions Chief Complaint (e.g., cough, sob, wheezing, fever, chills, sweat or additional symptoms) *Go to specific symptom protocol after initial questions. R lung pain - any breath I take hurts Fatigue - exactly the way I felt last year when I had PNA Hoarseness x 2 weeks  How long have symptoms been present? X 3 days  Have you tested for COVID or Flu? Note: If not, ask patient if a home test can be taken. If so, instruct patient to call back for positive results. No  MEDICINES:   Have you used any OTC meds to help with symptoms? No If yes, ask What medications? N/a  Have you used your inhalers/maintenance medication? Yes If yes, What medications? fluticasone -salmeterol (WIXELA INHUB ) - has not been taking consistently It bothers my eyes  If inhaler, ask How many puffs and how often? Note: Review instructions on medication in the chart. See above  OXYGEN: Do you wear supplemental oxygen? No If yes, How many liters are you supposed to use? N/a  Do you monitor your oxygen levels? No If yes, What is your reading  (oxygen level) today? N/a  What is your usual oxygen saturation reading?  (Note: Pulmonary O2 sats should be 90% or greater) N/a     1. RESPIRATORY STATUS: Describe your breathing? (e.g., wheezing, shortness of breath, unable to speak, severe coughing)      See above 2. ONSET: When did this breathing problem begin?      See above 3. PATTERN Does the difficult breathing come and go, or has it been constant since it started?      constant 4. SEVERITY: How bad is your breathing? (e.g., mild, moderate, severe)    - MILD: No SOB at rest, mild SOB with walking, speaks normally in sentences, can lie down, no retractions, pulse < 100.    - MODERATE: SOB at rest, SOB with minimal exertion and prefers to sit, cannot lie down flat, speaks in phrases, mild retractions, audible wheezing, pulse 100-120.    - SEVERE: Very SOB at rest, speaks in single words, struggling to breathe, sitting hunched forward, retractions, pulse > 120      Pain is there whether I do anything or not Easily winded mild sob -  5. RECURRENT SYMPTOM: Have you had difficulty breathing before? If Yes, ask: When was the last time? and What happened that time?      PNA last year 6. CARDIAC HISTORY: Do you have any history of heart disease? (e.g., heart attack, angina, bypass surgery, angioplasty)      denies 7. LUNG HISTORY: Do you have any history of lung disease?  (e.g., pulmonary embolus, asthma, emphysema)     no 8. CAUSE: What do you think is causing  the breathing problem?      Thinks PNA 9. OTHER SYMPTOMS: Do you have any other symptoms? (e.g., dizziness, runny nose, cough, chest pain, fever)     R lung pain Denies others 10. O2 SATURATION MONITOR:  Do you use an oxygen saturation monitor (pulse oximeter) at home? If Yes, ask: What is your reading (oxygen level) today? What is your usual oxygen saturation reading? (e.g., 95%)       N/a 11. PREGNANCY: Is there any chance you are  pregnant? When was your last menstrual period?       Denies, LMP 2 weeks ago 12. TRAVEL: Have you traveled out of the country in the last month? (e.g., travel history, exposures)       N/a  Protocols used: Breathing Difficulty-A-AH

## 2024-01-15 ENCOUNTER — Ambulatory Visit: Admitting: Pulmonary Disease

## 2024-01-17 ENCOUNTER — Ambulatory Visit: Admitting: Pulmonary Disease

## 2024-01-17 ENCOUNTER — Ambulatory Visit (INDEPENDENT_AMBULATORY_CARE_PROVIDER_SITE_OTHER)

## 2024-01-17 ENCOUNTER — Other Ambulatory Visit (HOSPITAL_BASED_OUTPATIENT_CLINIC_OR_DEPARTMENT_OTHER): Payer: Self-pay

## 2024-01-17 ENCOUNTER — Encounter: Payer: Self-pay | Admitting: Pulmonary Disease

## 2024-01-17 VITALS — BP 122/82 | HR 78 | Ht 63.0 in | Wt 217.4 lb

## 2024-01-17 DIAGNOSIS — R051 Acute cough: Secondary | ICD-10-CM

## 2024-01-17 DIAGNOSIS — R509 Fever, unspecified: Secondary | ICD-10-CM | POA: Diagnosis not present

## 2024-01-17 DIAGNOSIS — R059 Cough, unspecified: Secondary | ICD-10-CM | POA: Diagnosis not present

## 2024-01-17 DIAGNOSIS — R079 Chest pain, unspecified: Secondary | ICD-10-CM | POA: Diagnosis not present

## 2024-01-17 MED ORDER — AMOXICILLIN-POT CLAVULANATE 875-125 MG PO TABS
1.0000 | ORAL_TABLET | Freq: Two times a day (BID) | ORAL | 0 refills | Status: DC
  Filled 2024-01-17: qty 14, 7d supply, fill #0

## 2024-01-17 NOTE — Progress Notes (Signed)
 @Patient  ID: Helen Wells, female    DOB: 20-May-1986, 38 y.o.   MRN: [id number]  Chief Complaint  Patient presents with   Follow-up    Pt states pain in right lung. Pt also states a cough began 4 days prior. Cough is not constant, only occurs when she takes a deep breath. Low grade fever last two nights.  Pt states her lung feels like something is in it Pain is constant.  Pt states that nothing is able to help the cough or pain in the lung.      Referring provider: Frann Mabel Mt*  HPI:   38 y.o. woman whom we are seeing for evaluation of acute on chronic right chest pain.  Multiple prior pulmonary notes reviewed.  Multiple ED notes reviewed.  Multiple prior PCP notes reviewed.  She states she was diagnosed with pneumonia in the fall of 2024.  PCP note 04/2023 diagnosed with walking pneumonia.  I see no chest imaging ordered.  There are no images or results.  She was given antibiotics.  She was seen approximately 3 weeks later.  Additional antibiotics different antibiotics ordered.  She did not want to take this after seeing the package insert, this was levofloxacin .  Initial course was Augmentin .  Again I see no chest x-ray ordered or documented.  I see no results or images.  She states they did chest x-ray.  He was on the med [employer].  I showed her the medical record where there were no images or results.  Regardless, sound like this improved a bit.  Gradually with time.  Then had surgery earlier this year for partial nephrectomy.  This was on the left however.  But symptom has been intermittent right chest pain.  Occasionally pleuritic.  Like a pressure like someone pushing with a fist inside.  No time of day when things are better or worse.  No position of making his bed worse.  No seasonal environmental factors she can identify that makes better worse.  She is concerned because she has had elevated temperature the last few days so that with chest discomfort  makes her worried about pneumonia she was diagnosed with in the fall 2024.    Questionaires / Pulmonary Flowsheets:   ACT:      No data to display          MMRC:     No data to display          Epworth:      No data to display          Tests:   FENO:  Lab Results  Component Value Date   NITRICOXIDE 16 12/05/2023    PFT:     No data to display          WALK:      No data to display          Imaging: Personally reviewed and as per EMR in discussion of this note No results found.  Lab Results: Personally reviewed CBC    Component Value Date/Time   WBC 10.5 11/15/2023 1504   WBC 7.4 09/20/2023 1541   RBC 4.25 11/15/2023 1504   RBC 3.98 09/20/2023 1541   HGB 13.0 11/15/2023 1504   HCT 40.1 11/15/2023 1504   PLT 385 11/15/2023 1504   MCV 94 11/15/2023 1504   MCH 30.6 11/15/2023 1504   MCH 32.4 07/23/2023 1433   MCHC 32.4 11/15/2023 1504   MCHC 32.8 09/20/2023 1541  RDW 14.6 11/15/2023 1504   LYMPHSABS 2.6 09/20/2023 1541   MONOABS 0.7 09/20/2023 1541   EOSABS 0.2 09/20/2023 1541   BASOSABS 0.2 (H) 09/20/2023 1541    BMET    Component Value Date/Time   NA 139 09/20/2023 1541   NA 141 10/02/2022 0833   K 3.9 09/20/2023 1541   CL 108 09/20/2023 1541   CO2 23 09/20/2023 1541   GLUCOSE 84 09/20/2023 1541   BUN 7 09/20/2023 1541   BUN 12 10/02/2022 0833   CREATININE 1.01 09/20/2023 1541   CALCIUM 9.2 09/20/2023 1541   GFRNONAA >60 08/02/2023 0332    BNP No results found for: BNP  ProBNP No results found for: PROBNP  Specialty Problems   None   Allergies  Allergen Reactions   Bupivacaine -Meloxicam Er     Other reaction(s): Unknown   Erythromycin     Blind Other reaction(s): Other 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 Other reaction(s): Other Informed by her MD not to take any Tetracyclines. Informed by her MD not to take any Tetracyclines. Blind    Erythromycin Base     Other reaction(s): Other (See Comments) Other    Gluten Meal Diarrhea    Acid reflux/knots in stomach   Macrolides And Ketolides     Other reaction(s): Other (See Comments), Other (See Comments) Blindness, Pseudotumor Cerebri  Blindness, Pseudotumor Cerebri      Immunization History  Administered Date(s) Administered   Dtap, Unspecified 08/01/1985, 09/29/1985, 12/26/1985, 01/15/1987, 11/26/1990   Hep B, Unspecified 04/14/1997, 05/19/1997, 10/13/1997   MMR 10/02/1986, 12/10/1995   PFIZER(Purple Top)SARS-COV-2 Vaccination 09/13/2019, 10/15/2019   Polio, Unspecified 08/01/1985, 09/29/1985, 01/15/1987, 11/26/1990   Tdap 01/07/2007, 04/08/2015, 04/07/2021    Past Medical History:  Diagnosis Date   ADHD (attention deficit hyperactivity disorder)    Anemia    Anemia    Anxiety    Clotting disorder (HCC)    Headache    Hyperlipemia    Hypertension    Hypertension     Tobacco History: Social History   Tobacco Use  Smoking Status Never  Smokeless Tobacco Never   Counseling given: Not Answered   Continue to not smoke  Outpatient Encounter Medications as of 01/17/2024  Medication Sig   amoxicillin -clavulanate (AUGMENTIN ) 875-125 MG tablet Take 1 tablet by mouth 2 (two) times daily.   acetaminophen  (TYLENOL ) 500 MG tablet Take 1,000 mg by mouth every 6 (six) hours as needed (pain.).   acetaZOLAMIDE  (DIAMOX ) 250 MG tablet Take 2 tablets (500 mg total) by mouth 2 (two) times daily.   amitriptyline  (ELAVIL ) 25 MG tablet Take 1 tablet (25 mg total) by mouth at bedtime.   b complex vitamins capsule Take 1 capsule by mouth daily.   ferrous sulfate 324 MG TBEC Take 324 mg by mouth.   folic acid  (FOLVITE ) 1 MG tablet Take 1 mg by mouth daily.   lisinopril  (ZESTRIL ) 20 MG tablet Take 1 tablet (20 mg total) by  mouth daily.   [DISCONTINUED] fluticasone -salmeterol (WIXELA INHUB ) 100-50 MCG/ACT AEPB Inhale 1 puff into the lungs 2 (two) times daily.   No facility-administered encounter medications on file as of 01/17/2024.     Review of Systems  Review of Systems  No chest pain with exertion.  No orthopnea or PND.  Comprehensive review of systems otherwise negative. Physical Exam  BP 122/82 (BP Location: Left Arm, Cuff Size: Large)   Pulse 78   Ht 5' 3 (1.6 m)   Wt 217 lb 6.4 oz (98.6 kg)   SpO2 98%   BMI 38.51 kg/m   Wt Readings from Last 5 Encounters:  01/17/24 217 lb 6.4 oz (98.6 kg)  12/05/23 220 lb (99.8 kg)  12/05/23 220 lb 12.8 oz (100.2 kg)  11/15/23 221 lb (100.2 kg)  10/02/23 217 lb (98.4 kg)    BMI Readings from Last 5 Encounters:  01/17/24 38.51 kg/m  12/05/23 38.97 kg/m  12/05/23 39.11 kg/m  11/15/23 39.15 kg/m  10/02/23 38.44 kg/m     Physical Exam Sitting up, in no acute distress Eyes: EOMI, no icterus Neck: Supple, no JVP Abdomen: Nondistended MSK: No synovitis midodrine effusion Pulmonary: Clear, no work of breathing, no crackles or wheeze  Assessment & Plan:   Acute cough, low-grade temperature elevation, intermittent pleuritic chest pain on the right: She is concerned about pneumonia.  Was diagnosed with walking pneumonia fall 2024.  She reports chest x-ray which I do not doubt.  However I see no record of this.  There is no results or images this seems odd to me as it was done admitted to [facility] which is within our system and should be available to you.  I checked the encounter details and there is no chest x-ray ordered.  Regardless, concern for pneumonia.  Fortune lung exam is clear.  Chest x-ray today.  Given going to the holiday weekend and likely delay getting results, preemptive prescription for Augmentin  1 tablet twice daily for 7 days for possible pneumonia sent.  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  May need to consider Biologics in the future given inability to tolerate inhalers.   No follow-ups on file.  Recommend her encouraged to attend upcoming follow-up with Dr. Meade 02/21/2024   Donnice JONELLE Beals, MD 01/17/2024

## 2024-01-17 NOTE — Patient Instructions (Addendum)
 With the fever and chest pain, was get a chest x-ray to evaluate for pneumonia  Given were going into a holiday weekend I went ahead and prescribed Augmentin  1 tablet twice a day for 7 days as there could be a delay in getting the results of the chest x-ray  Lets continue your previously scheduled follow-up appointment with Dr. Meade after pulmonary function test on August 7

## 2024-01-20 ENCOUNTER — Ambulatory Visit: Payer: Self-pay | Admitting: Pulmonary Disease

## 2024-01-22 ENCOUNTER — Telehealth: Payer: Self-pay

## 2024-01-22 ENCOUNTER — Other Ambulatory Visit (HOSPITAL_BASED_OUTPATIENT_CLINIC_OR_DEPARTMENT_OTHER): Payer: Self-pay

## 2024-01-22 DIAGNOSIS — B379 Candidiasis, unspecified: Secondary | ICD-10-CM

## 2024-01-22 MED ORDER — FLUCONAZOLE 100 MG PO TABS
100.0000 mg | ORAL_TABLET | Freq: Every day | ORAL | 0 refills | Status: AC
  Filled 2024-01-22: qty 3, 3d supply, fill #0

## 2024-01-22 NOTE — Telephone Encounter (Signed)
 Per Dr.Wert ok to send in Dliflucan 100 daily for 3 days. Patient has been notified. Nothing else further needed.

## 2024-01-22 NOTE — Telephone Encounter (Signed)
 Copied from CRM [id number]. Topic: Clinical - Medication Question >> Jan 17, 2024  4:35 PM [name] wrote: Reason for CRM: Pt stated she was prescribed amoxicillin -clavulanate (AUGMENTIN ) 875-125 MG tablet [[id number]] today, but she forgot to ask Dr. Annella for a yeast infection medication as well. Pt is requesting two fills of the medication as she stated she typically has a problem with yeast after the first round of mediation. Pt's phone number is [phone].  Pt's pharmacy: [facility] [place] [facility] [address], [address] [place] [address] Phone: [phone] Fax: [phone] Hours: M-F 7:30am-6pm  Pt is requesting a call back if possible to confirm medication is sent to pharmacy.  Spoke with patient regarding prior message.Patient stated that she has not started her medication yet due to patient get's yeast infection when she takes antibiotics .Advised patient that Dr.Hunsucker is not in the office this week and will send this message to the DOD. Advised patient someone will contact her back once we get this message back from the DOD.   Dr.Wert can you please advise

## 2024-02-12 DIAGNOSIS — D1771 Benign lipomatous neoplasm of kidney: Secondary | ICD-10-CM | POA: Diagnosis not present

## 2024-02-18 ENCOUNTER — Other Ambulatory Visit (HOSPITAL_BASED_OUTPATIENT_CLINIC_OR_DEPARTMENT_OTHER): Payer: Self-pay

## 2024-02-19 DIAGNOSIS — D3501 Benign neoplasm of right adrenal gland: Secondary | ICD-10-CM | POA: Diagnosis not present

## 2024-02-19 DIAGNOSIS — D1771 Benign lipomatous neoplasm of kidney: Secondary | ICD-10-CM | POA: Diagnosis not present

## 2024-02-19 DIAGNOSIS — Z9049 Acquired absence of other specified parts of digestive tract: Secondary | ICD-10-CM | POA: Diagnosis not present

## 2024-02-19 DIAGNOSIS — K432 Incisional hernia without obstruction or gangrene: Secondary | ICD-10-CM | POA: Diagnosis not present

## 2024-02-21 ENCOUNTER — Encounter: Payer: Self-pay | Admitting: Internal Medicine

## 2024-02-21 ENCOUNTER — Ambulatory Visit: Admitting: Internal Medicine

## 2024-02-21 VITALS — BP 114/92 | HR 75 | Temp 98.5°F | Ht 63.0 in | Wt 219.0 lb

## 2024-02-21 DIAGNOSIS — R079 Chest pain, unspecified: Secondary | ICD-10-CM

## 2024-02-21 DIAGNOSIS — R053 Chronic cough: Secondary | ICD-10-CM | POA: Diagnosis not present

## 2024-02-21 DIAGNOSIS — R0602 Shortness of breath: Secondary | ICD-10-CM

## 2024-02-21 DIAGNOSIS — R0789 Other chest pain: Secondary | ICD-10-CM

## 2024-02-21 LAB — PULMONARY FUNCTION TEST
DL/VA % pred: 125 %
DL/VA: 5.64 ml/min/mmHg/L
DLCO unc % pred: 126 %
DLCO unc: 26.91 ml/min/mmHg
FEF 25-75 Pre: 4.68 L/s
FEF2575-%Pred-Pre: 148 %
FEV1-%Pred-Pre: 109 %
FEV1-Pre: 3.25 L
FEV1FVC-%Pred-Pre: 106 %
FEV6-%Pred-Pre: 104 %
FEV6-Pre: 3.68 L
FEV6FVC-%Pred-Pre: 101 %
FVC-%Pred-Pre: 102 %
FVC-Pre: 3.68 L
Pre FEV1/FVC ratio: 88 %
Pre FEV6/FVC Ratio: 100 %
RV % pred: 102 %
RV: 1.54 L
TLC % pred: 108 %
TLC: 5.31 L

## 2024-02-21 NOTE — Progress Notes (Signed)
 Helen Wells    [id number]    Dec 11, 1985  Primary Care Physician:Wendling, Mabel Mt, DO Date of Appointment: 02/21/2024 Established Patient Visit  Chief complaint:   Chief Complaint  Patient presents with   Follow-up    PFT. Patient states she has a heavy chest     HPI: Helen Wells is a 38 y.o. woman with cough and chest heaviness.   Interval Updates: Here for follow up after PFTs which show normal pulmonary function.   She did not have any improvement in her breathing with advair . She had adverse effects with advair  which worsened her blurry vision related to her IIH.   She was seen for an acute visit with Dr. Annella and he ordered a chest xray which was normal. He prescribed augmentin  for possible pneumonia. She had a yeast infection from the antibiotics.   Her symptoms are otherwise unchanged. She feels they started ever since her renal mass excision in January 2025. She was asking about any bacterial exposure during her surgery that could be causing her symptoms.   I have reviewed the patient's family social and past medical history and updated as appropriate.   Past Medical History:  Diagnosis Date   ADHD (attention deficit hyperactivity disorder)    Anemia    Anemia    Anxiety    Clotting disorder (HCC)    Headache    Hyperlipemia    Hypertension    Hypertension     Past Surgical History:  Procedure Laterality Date   Gallbladder Removal 2017     lancinectomy and Discectomy 2021     Dr Sherryl - Neurosurgeon   ROBOTIC ASSITED PARTIAL NEPHRECTOMY Left 08/01/2023   Procedure: XI ROBOTIC ASSITED LEFT PARTIAL NEPHRECTOMY WITH ULTRASOUND;  Surgeon: Alvaro Ricardo KATHEE Mickey., MD;  Location: [facility];  Service: Urology;  Laterality: Left;  180 MINUTES    Family History  Problem Relation Age of Onset   Hypertension Mother    Arthritis Mother    High blood pressure Mother    Hypertension Father    Birth defects Father    High  Cholesterol Father    Learning disabilities Father    Alzheimer's disease Son     Social History   Occupational History   Not on file  Tobacco Use   Smoking status: Never   Smokeless tobacco: Never  Vaping Use   Vaping status: Never Used  Substance and Sexual Activity   Alcohol use: Yes    Alcohol/week: 1.0 standard drink of alcohol    Types: 1 Shots of liquor per week    Comment: 1 drink per month   Drug use: Never   Sexual activity: Yes    Birth control/protection: None     Physical Exam: Blood pressure (!) 114/92, pulse 75, temperature 98.5 F (36.9 C), temperature source Oral, height 5' 3 (1.6 m), weight 219 lb (99.3 kg), peak flow 95 L/min.  Gen:      No acute distress ENT:  no nasal polyps, mucus membranes moist Lungs:    No increased respiratory effort, symmetric chest wall excursion, clear to auscultation bilaterally, no wheezes or crackles CV:         Regular rate and rhythm; no murmurs, rubs, or gallops.  No pedal edema   Data Reviewed: Imaging: I have personally reviewed the chest xray July 2025 - no acute cardiopulmonary process.   PFTs:     Latest Ref Rng & Units 02/21/2024  2:51 PM  PFT Results  FVC-Pre L 3.68  P  FVC-Predicted Pre % 102  P  Pre FEV1/FVC % % 88  P  FEV1-Pre L 3.25  P  FEV1-Predicted Pre % 109  P  DLCO uncorrected ml/min/mmHg 26.91  P  DLCO UNC% % 126  P  DLVA Predicted % 125  P  TLC L 5.31  P  TLC % Predicted % 108  P  RV % Predicted % 102  P    P Preliminary result   I have personally reviewed the patient's PFTs and normal pulmonary function.  Labs: Lab Results  Component Value Date   NA 139 09/20/2023   K 3.9 09/20/2023   CO2 23 09/20/2023   GLUCOSE 84 09/20/2023   BUN 7 09/20/2023   CREATININE 1.01 09/20/2023   CALCIUM 9.2 09/20/2023   GFR 70.72 09/20/2023   EGFR 80 10/02/2022   GFRNONAA >60 08/02/2023   Lab Results  Component Value Date   WBC 10.5 11/15/2023   HGB 13.0 11/15/2023   HCT 40.1 11/15/2023    MCV 94 11/15/2023   PLT 385 11/15/2023    Immunization status: Immunization History  Administered Date(s) Administered   Dtap, Unspecified 08/01/1985, 09/29/1985, 12/26/1985, 01/15/1987, 11/26/1990   Hep B, Unspecified 04/14/1997, 05/19/1997, 10/13/1997   MMR 10/02/1986, 12/10/1995   PFIZER(Purple Top)SARS-COV-2 Vaccination 09/13/2019, 10/15/2019   Polio, Unspecified 08/01/1985, 09/29/1985, 01/15/1987, 11/26/1990   Tdap 01/07/2007, 04/08/2015, 04/07/2021    External Records Personally Reviewed: pulmonary  Assessment:  Chest pain Chest heaviness Hypertension  Plan/Recommendations:  Reasonable to stop advair  due to limited She did not do the albuterol  challenge during PFT>  PFT normal pulmonary function. Chest imaging reviewed and clear Due to family history of CAD and her early onset HTN recommend cardiology referral. Doubt the symptoms of chest heaviness are related to pulmonary etiology.    Return to Care: Return if symptoms worsen or fail to improve.   Verdon Gore, MD Pulmonary and Critical Care Medicine [facility] Office:[phone]

## 2024-02-21 NOTE — Progress Notes (Signed)
 Full pft performed today except post spiro. Patient did not feel comfortable using inhaler.

## 2024-02-21 NOTE — Patient Instructions (Signed)
 It was a pleasure to see you today!  Please schedule follow up as needed. Please call [phone] if  issues or concerns arise. You can also send us  a message through MyChart, but but aware that this is not to be used for urgent issues and it may take up to 5-7 days to receive a reply. Please be aware that you will likely be able to view your results before I have a chance to respond to them. Please give us  5 business days to respond to any non-urgent results.    Before your next visit I would like you to have:  Your breathing testing is normal.  Your chest xray is clear. Due to your family history and your blood pressure history I am recommending you see a cardiologist for next steps for evaluation.

## 2024-02-21 NOTE — Patient Instructions (Signed)
 Full pft performed today except post spiro. Patient did not feel comfortable using inhaler.

## 2024-03-04 DIAGNOSIS — D1771 Benign lipomatous neoplasm of kidney: Secondary | ICD-10-CM | POA: Diagnosis not present

## 2024-03-10 ENCOUNTER — Other Ambulatory Visit: Payer: Self-pay | Admitting: Family Medicine

## 2024-03-10 ENCOUNTER — Other Ambulatory Visit (HOSPITAL_BASED_OUTPATIENT_CLINIC_OR_DEPARTMENT_OTHER): Payer: Self-pay

## 2024-03-10 MED ORDER — CEFDINIR 300 MG PO CAPS
300.0000 mg | ORAL_CAPSULE | Freq: Two times a day (BID) | ORAL | 0 refills | Status: AC
  Filled 2024-03-10: qty 14, 7d supply, fill #0

## 2024-03-10 MED ORDER — FLUCONAZOLE 150 MG PO TABS
ORAL_TABLET | ORAL | 0 refills | Status: DC
  Filled 2024-03-10: qty 2, 4d supply, fill #0

## 2024-03-13 ENCOUNTER — Other Ambulatory Visit: Payer: Self-pay | Admitting: Family Medicine

## 2024-03-13 ENCOUNTER — Other Ambulatory Visit (HOSPITAL_BASED_OUTPATIENT_CLINIC_OR_DEPARTMENT_OTHER): Payer: Self-pay

## 2024-03-13 MED ORDER — AMITRIPTYLINE HCL 25 MG PO TABS
25.0000 mg | ORAL_TABLET | Freq: Every day | ORAL | 2 refills | Status: AC
  Filled 2024-03-13 – 2024-04-04 (×2): qty 90, 90d supply, fill #0
  Filled 2024-07-16: qty 90, 90d supply, fill #1

## 2024-03-14 ENCOUNTER — Encounter: Payer: Commercial Managed Care - PPO | Admitting: Family Medicine

## 2024-03-25 ENCOUNTER — Encounter: Attending: Physical Medicine & Rehabilitation | Admitting: Physical Medicine & Rehabilitation

## 2024-03-25 ENCOUNTER — Other Ambulatory Visit (HOSPITAL_BASED_OUTPATIENT_CLINIC_OR_DEPARTMENT_OTHER): Payer: Self-pay

## 2024-03-25 ENCOUNTER — Encounter: Payer: Self-pay | Admitting: Physical Medicine & Rehabilitation

## 2024-03-25 VITALS — BP 122/84 | HR 65 | Ht 63.0 in | Wt 200.2 lb

## 2024-03-25 DIAGNOSIS — M7072 Other bursitis of hip, left hip: Secondary | ICD-10-CM | POA: Diagnosis not present

## 2024-03-25 DIAGNOSIS — M7662 Achilles tendinitis, left leg: Secondary | ICD-10-CM | POA: Insufficient documentation

## 2024-03-25 NOTE — Patient Instructions (Signed)
 Achilles Tendinitis Rehab Ask your health care provider which exercises are safe for you. Do exercises exactly as told by your provider and adjust them as directed. It is normal to feel mild stretching, pulling, tightness, or discomfort as you do these exercises. Stop right away if you feel sudden pain or your pain gets worse. Do not begin these exercises until told by your provider. Stretching and range-of-motion exercises These exercises warm up your muscles and joints. They improve the movement and flexibility of your ankle. These exercises also help to relieve pain. Standing wall calf stretch with straight knee  Stand with your hands against a wall. Extend your left / right leg behind you, and bend your front knee slightly. Keep both of your heels on the floor. Point the toes of your back foot slightly inward. Keeping your heels on the floor and your back knee straight, shift your weight toward the wall. Do not let your back arch. You should feel a gentle stretch in your upper calf. Hold this position for __________ seconds. Repeat __________ times. Complete this exercise __________ times a day. Standing wall calf stretch with bent knee  Stand with your hands against a wall. Extend your left / right leg behind you, and bend your front knee slightly. Keep both of your heels on the floor. Point the toes of your back foot slightly inward. Keeping your heels on the floor, bend your back knee slightly. You should feel a gentle stretch deep in your lower calf near your heel. Hold this position for __________ seconds. Repeat __________ times. Complete this exercise __________ times a day. Strengthening exercises These exercises build strength and control of your ankle. Endurance is the ability to use your muscles for a long time, even after they get tired. Plantar flexion with band In this exercise, you push your toes downward, away from you, with an exercise band providing resistance. Sit on  the floor with your left / right leg extended. You may put a pillow under your calf to give your foot more room to move. Loop a rubber exercise band or tube around the ball of your left / right foot. The ball of your foot is on the walking surface, right under your toes. The band or tube should be slightly tense when your foot is relaxed. If the band or tube slips, you can put on your shoe or put a washcloth between the band and your foot to help it stay in place. Slowly point your toes downward, pushing them away from you (plantar flexion). Hold this position for __________ seconds. Slowly release the tension in the band or tube, controlling smoothly until your foot is back to the starting position. Repeat steps 1-5 with your left / right leg. Repeat __________ times. Complete this exercise __________ times a day. Eccentric heel drop  In this exercise, you stand and slowly raise your heel and then slowly lower it. This exercise lengthens the calf muscles (eccentric) while the foot bears weight. If this exercise is too easy, try doing it while wearing a backpack with weights in it. Stand on a step with the balls of your feet. The ball of your foot is on the walking surface, right under your toes. Do not put your heels on the step. For balance, rest your hands on the wall or on a railing. Rise up onto the balls of your feet. Keeping your heels up, shift all of your weight to your left / right leg and pick up your  other leg. Slowly lower your left / right leg so your heel drops below the level of the step. Put down your other foot before going back to the start position. If told by your provider, build up to: 3 sets of 15 repetitions while keeping your knees straight. 3 sets of 15 repetitions while keeping your knees slightly bent as far as told by your provider. Repeat __________ times. Complete this exercise __________ times a day. Balance exercises These exercises improve or maintain your  balance. Balance helps to prevent falls. Single leg stand If this exercise is too easy, you can try it with your eyes closed or while standing on a pillow. Without shoes, stand near a railing or in a doorframe. Hold on to the railing or doorframe as needed. Stand on your left / right foot. Keep your big toe down on the floor and try to keep your arch lifted. You should feel a stretch across the bottom of your foot and arch. Do not let your foot roll inward. Hold this position for __________ seconds. Repeat __________ times. Complete this exercise __________ times a day. This information is not intended to replace advice given to you by your health care provider. Make sure you discuss any questions you have with your health care provider. Document Revised: 04/11/2022 Document Reviewed: 04/11/2022 Elsevier Patient Education  2024 [employer]Semimembranosus Tendinitis Rehab Ask your health care provider which exercises are safe for you. Do exercises exactly as told by your health care provider and adjust them as directed. It is normal to feel mild stretching, pulling, tightness, or discomfort as you do these exercises. Stop right away if you feel sudden pain or your pain gets worse. Do not begin these exercises until told by your health care provider. Stretching and range-of-motion exercise This exercise warms up your muscles and joints and improves the movement and flexibility of your thigh. This exercise also helps to relieve pain, numbness, and tingling. Hamstring stretch, supine  Lie on your back (supine position). Loop a belt or towel over the ball of your left / right foot. The ball of your foot is on the walking surface, right under your toes. Straighten your left / right knee and slowly pull on the belt to raise your leg. Stop when you feel a gentle stretch behind your knee or thigh (hamstring). Do not allow your knee to bend while you do this. Keep your other leg flat on the floor. Hold  this position for __________ seconds. Repeat __________ times. Complete this exercise __________ times a day. Strengthening exercises These exercises build strength and endurance in your thigh. Endurance is the ability to use your muscles for a long time, even after they get tired. Straight leg raises, prone This exercise strengthens the hip extensor muscles. Lie on your abdomen on a bed or a firm surface (prone position). Tense the muscles in your buttocks and lift your left / right leg about 4 inches (10 cm). Keep your knee straight as you lift your leg. If you cannot lift your leg that high without arching your back, place a pillow under your hips. Hold this position for __________seconds. Slowly lower your leg to the starting position. Let your muscles relax completely before you do another repetition. Repeat __________ times. Complete this exercise __________ times a day. Bridge  This exercise is sometimes called a hip extensor exercise. If this exercise is too easy, try doing it with your arms crossed over your chest. Lie on your back on a  firm surface with your knees bent and your feet flat on the floor. Tighten your buttocks muscles and lift your buttocks off the floor until your trunk is level with your thighs. Do not arch your back. You should feel the muscles working in your buttocks and the back of your thighs (hip extensors). If you do not feel a stretch in these muscles, slide your feet 1-2 inches (2.5-5 cm) farther away from your buttocks. Hold this position for __________ seconds. Slowly lower your hips to the starting position. Let your buttocks muscles relax completely between repetitions. Repeat __________ times. Complete this exercise __________ times a day. Eccentric hamstring, prone Eccentric hamstring exercise means that muscles in the back of the knee are lengthening under tension. Lie on your abdomen on a bed or on the floor (prone position). Start with your legs  straight. Cross your legs at the ankles with your left / right leg on top. Using your bottom leg to do the work, bend both knees. Using just your left / right leg, slowly lower your leg back down toward the bed. Add a __________ lb / kg weight as told by your health care provider. Let your muscles relax completely between repetitions. Repeat __________ times. Complete this exercise __________ times a day. Squats  Stand in front of a table with your feet and knees pointing straight ahead. You may rest your hands on the table for balance but not for support. Slowly bend your knees and lower your hips like you are going to sit in a chair. Keep your thighs straight or pointed slightly outward. Keep your weight over your heels, not over your toes. Keep your lower legs upright so they are parallel with the table legs. Do not let your hips go lower than your knees. Do not bend your knees lower than told by your health care provider. If your knee pain increases, do not bend as low. Hold this position for __________ seconds. Slowly push with your legs to return to standing. Do not use your hands to pull yourself to standing. Repeat __________ times. Complete this exercise __________ times a day. This information is not intended to replace advice given to you by your health care provider. Make sure you discuss any questions you have with your health care provider. Document Revised: 02/08/2021 Document Reviewed: 02/08/2021 Elsevier Patient Education  2024 [employer].

## 2024-03-25 NOTE — Progress Notes (Addendum)
 Subjective:    Patient ID: Helen Wells, female    DOB: 29-Jan-1986, 38 y.o.   MRN: [id number]  HPI CC:  Left sided buttocks pain  38 year old female with history of L5-S1 decompression as well as left sacroiliac disorder returns today for increasing pain. Started ~33mo ago after foot injury in June  caused limp.  She did not note any direct injury to the buttock area although notes some potential injury when she was lifting someone on the water  during summer camp. This pain is worse with sitting.  It feels lower down than her previous sacroiliac pain. 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  Pain Inventory Average Pain 0 Pain Right Now 6 My pain is constant, sharp, stabbing, and aching  In the last 24 hours, has pain interfered with the following? General activity 3 Relation with others 0 Enjoyment of life 5 What TIME of day is your pain at its worst? morning , daytime, evening, and night Sleep (in general) Poor  Pain is worse with: bending, sitting, and some activites Pain improves with: heat/ice Relief from Meds: 0  Family History  Problem Relation Age of Onset   Hypertension Mother    Arthritis Mother    High blood pressure Mother    Hypertension Father    Birth defects Father    High Cholesterol Father    Learning disabilities Father    Alzheimer's disease Son    Social History   Socioeconomic History   Marital status: Married    Spouse name: Not on file   Number of children: 2   Years of education: Not on file   Highest education level: Bachelor's degree (e.g., BA, AB, BS)  Occupational History   Not on file  Tobacco Use   Smoking status: Never   Smokeless tobacco: Never  Vaping Use   Vaping status: Never Used  Substance and Sexual Activity   Alcohol use: Yes    Alcohol/week: 1.0 standard drink of alcohol    Types: 1 Shots of liquor  per week    Comment: 1 drink per month   Drug use: Never   Sexual activity: Yes    Birth control/protection: None  Other Topics Concern   Not on file  Social History Narrative   ** Merged History Encounter **       Social Drivers of Corporate Investment Banker Strain: Not on file  Food Insecurity: No Food Insecurity (08/01/2023)   Hunger Vital Sign    Worried About Running Out of Food in the Last Year: Never true    Ran Out of Food in the Last Year: Never true  Transportation Needs: No Transportation Needs (08/01/2023)   PRAPARE - Administrator, Civil Service (Medical): No    Lack of Transportation (Non-Medical): No  Physical Activity: Not on file  Stress: Stress Concern Present (06/02/2020)   Received from [facility] - Occupational Stress Questionnaire    Feeling of Stress : To some extent  Social Connections: Unknown (11/14/2021)   [facility]   Social Network    Social Network: Not on file   Past Surgical History:  Procedure Laterality Date   Gallbladder Removal 2017     lancinectomy and Discectomy 2021     Dr Sherryl - Neurosurgeon   ROBOTIC ASSITED PARTIAL NEPHRECTOMY Left 08/01/2023  Procedure: XI ROBOTIC ASSITED LEFT PARTIAL NEPHRECTOMY WITH ULTRASOUND;  Surgeon: Alvaro Ricardo KATHEE Mickey., MD;  Location: [facility];  Service: Urology;  Laterality: Left;  180 MINUTES   Past Surgical History:  Procedure Laterality Date   Gallbladder Removal 2017     lancinectomy and Discectomy 2021     Dr Sherryl - Neurosurgeon   ROBOTIC ASSITED PARTIAL NEPHRECTOMY Left 08/01/2023   Procedure: XI ROBOTIC ASSITED LEFT PARTIAL NEPHRECTOMY WITH ULTRASOUND;  Surgeon: Alvaro Ricardo KATHEE Mickey., MD;  Location: [facility];  Service: Urology;  Laterality: Left;  180 MINUTES   Past Medical History:  Diagnosis Date   ADHD (attention deficit hyperactivity disorder)    Anemia    Anemia    Anxiety    Clotting disorder (HCC)     Headache    Hyperlipemia    Hypertension    Hypertension    BP 122/84 (BP Location: Left Arm, Patient Position: Sitting, Cuff Size: Large)   Pulse 65   Ht 5' 3 (1.6 m)   Wt 200 lb 3.2 oz (90.8 kg)   SpO2 99%   BMI 35.46 kg/m   Opioid Risk Score:   Fall Risk Score:  `1  Depression screen [facility] 2/9     03/25/2024   10:08 AM 11/15/2023    2:22 PM 03/13/2023    9:56 AM 01/23/2023    3:06 PM 11/24/2022    1:03 PM 10/31/2022   12:29 PM 09/27/2022    8:10 AM  Depression screen PHQ 2/9  Decreased Interest 0 1 0 0 0 0 0  Down, Depressed, Hopeless 0 1 0 0 0 0 0  PHQ - 2 Score 0 2 0 0 0 0 0  Altered sleeping  2 0    0  Tired, decreased energy  2 0    2  Change in appetite  1 0    0  Feeling bad or failure about yourself   0 0    0  Trouble concentrating  0 0    0  Moving slowly or fidgety/restless  0 0    0  Suicidal thoughts  0 0    0  PHQ-9 Score  7 0    2  Difficult doing work/chores   Not difficult at all    Not difficult at all     Review of Systems  Musculoskeletal:  Positive for back pain.       Left side back and sciatic pain  All other systems reviewed and are negative.      Objective:   Physical Exam Vitals and nursing note reviewed.  Constitutional:      Appearance: She is obese.  Musculoskeletal:     Right lower leg: No edema.     Left lower leg: No edema.     Comments: Negative Thompson's test There is pain in the hamstring area with extension of the knee in a seated position. There is tenderness over the ischial tuberosity on the left side Gaenslens: Positive left Sacral thrust (prone) : Positive left Lateral compression: Negative FABER's: Negative Distraction (supine): Negative Thigh thrust test: Negative  No tenderness over the greater trochanter of the hip No tenderness of the lumbar paraspinal area Lumbar range of motion no pain with extension does have pain with forward flexion Lumbar area  Skin:    General: Skin is dry.  Neurological:      General: No focal deficit present.     Mental Status: She is alert and oriented to person,  place, and time.  Psychiatric:        Mood and Affect: Mood normal.        Behavior: Behavior normal.           Assessment & Plan:  #1.  Left buttock pain does not appear to be sacroiliac instead appears to be ischial tuberosity/hamstring pain.  I have given her exercises for this. We discussed that at this point do not recommend sacroiliac injection. Consider physical therapy Consider shockwave therapy 2.  Ankle pain appears to be improving no sign of Achilles tear.  Have given her some exercises to do for this as well  Patient called in complaining of increasing pain at this time on the right side starting on 05/14/2024.  Not much better on 05/19/2024.  Pain is mainly at the junction between the low back and buttock area.  She does have some radiating pain to the lower buttock but nothing into the leg.  No recent fevers.  We discussed her past medical history and medication allergies.  She did have stomach ulcers diagnosed after using Mobic for 1 year.  We discussed short-term use of Celebrex 100 mg twice daily along with Prilosec 40 mg/day.  10-day course.  She may stop this before the 10 days over if she is having improved symptoms

## 2024-03-27 ENCOUNTER — Encounter: Payer: Self-pay | Admitting: Family Medicine

## 2024-04-01 ENCOUNTER — Other Ambulatory Visit: Payer: Self-pay

## 2024-04-01 ENCOUNTER — Ambulatory Visit (INDEPENDENT_AMBULATORY_CARE_PROVIDER_SITE_OTHER): Admitting: Family Medicine

## 2024-04-01 ENCOUNTER — Ambulatory Visit: Payer: Self-pay | Admitting: Family Medicine

## 2024-04-01 ENCOUNTER — Encounter: Payer: Self-pay | Admitting: Family Medicine

## 2024-04-01 VITALS — BP 126/78 | HR 75 | Temp 98.0°F | Resp 16 | Ht 63.0 in | Wt 222.8 lb

## 2024-04-01 DIAGNOSIS — Z0000 Encounter for general adult medical examination without abnormal findings: Secondary | ICD-10-CM | POA: Diagnosis not present

## 2024-04-01 DIAGNOSIS — E7800 Pure hypercholesterolemia, unspecified: Secondary | ICD-10-CM

## 2024-04-01 LAB — CBC
HCT: 39.8 % (ref 36.0–46.0)
Hemoglobin: 12.9 g/dL (ref 12.0–15.0)
MCHC: 32.5 g/dL (ref 30.0–36.0)
MCV: 88.4 fl (ref 78.0–100.0)
Platelets: 268 K/uL (ref 150.0–400.0)
RBC: 4.5 Mil/uL (ref 3.87–5.11)
RDW: 14.2 % (ref 11.5–15.5)
WBC: 6.9 K/uL (ref 4.0–10.5)

## 2024-04-01 LAB — LIPID PANEL
Cholesterol: 220 mg/dL — ABNORMAL HIGH (ref 0–200)
HDL: 40.7 mg/dL (ref >39.00)
LDL Cholesterol: 146 mg/dL — ABNORMAL HIGH (ref 0–99)
NonHDL: 179.47
Total CHOL/HDL Ratio: 5
Triglycerides: 168 mg/dL — ABNORMAL HIGH (ref 0.0–149.0)
VLDL: 33.6 mg/dL (ref 0.0–40.0)

## 2024-04-01 LAB — B12 AND FOLATE PANEL
Folate: 4.9 ng/mL — ABNORMAL LOW (ref >5.9)
Vitamin B-12: 205 pg/mL — ABNORMAL LOW (ref 211–911)

## 2024-04-01 NOTE — Progress Notes (Signed)
 Chief Complaint  Patient presents with   Annual Exam    CPE     Well Woman Helen Wells is here for a complete physical.   Her last physical was >1 year ago.  Current diet: in general, a healthy diet. Current exercise: walking. Fatigue out of ordinary? No Seatbelt? Yes Advanced directive? No  Health Maintenance Pap/HPV- Yes Tetanus- Yes HIV screening- Yes Hep C screening- Yes  Past Medical History:  Diagnosis Date   ADHD (attention deficit hyperactivity disorder)    Anemia    Anemia    Anxiety    Clotting disorder (HCC)    Headache    Hyperlipemia    Hypertension    Hypertension      Past Surgical History:  Procedure Laterality Date   Gallbladder Removal 2017     lancinectomy and Discectomy 2021     Dr Sherryl - Neurosurgeon   ROBOTIC ASSITED PARTIAL NEPHRECTOMY Left 08/01/2023   Procedure: XI ROBOTIC ASSITED LEFT PARTIAL NEPHRECTOMY WITH ULTRASOUND;  Surgeon: Alvaro Ricardo KATHEE Mickey., MD;  Location: [facility];  Service: Urology;  Laterality: Left;  180 MINUTES    Medications  Current Outpatient Medications on File Prior to Visit  Medication Sig Dispense Refill   acetaminophen  (TYLENOL ) 500 MG tablet Take 1,000 mg by mouth every 6 (six) hours as needed (pain.).     acetaZOLAMIDE  (DIAMOX ) 250 MG tablet Take 2 tablets (500 mg total) by mouth 2 (two) times daily. 120 tablet 11   amitriptyline  (ELAVIL ) 25 MG tablet Take 1 tablet (25 mg total) by mouth at bedtime. 90 tablet 2   b complex vitamins capsule Take 1 capsule by mouth daily.     ferrous sulfate 324 MG TBEC Take 324 mg by mouth.     fluconazole  (DIFLUCAN ) 150 MG tablet Take 1 tab, repeat in 72 hours if no improvement. 2 tablet 0   folic acid  (FOLVITE ) 1 MG tablet Take 1 mg by mouth daily.     lisinopril  (ZESTRIL ) 20 MG tablet Take 1 tablet (20 mg total) by mouth daily. 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 Other reaction(s): Other Informed by her MD not to take any Tetracyclines. Informed by her MD not to take any Tetracyclines. Blind    Erythromycin Base     Other reaction(s): Other (See Comments) Other    Gluten Meal Diarrhea    Acid reflux/knots in stomach   Macrolides And Ketolides     Other reaction(s): Other (See Comments), Other (See Comments) Blindness, Pseudotumor Cerebri  Blindness, Pseudotumor Cerebri      Review of Systems: Constitutional:  no unexpected weight changes Eye:  no recent significant change in vision Ear/Nose/Mouth/Throat:  Ears:  no tinnitus or vertigo and no recent change in hearing Nose/Mouth/Throat:  no complaints of nasal congestion, no sore throat Cardiovascular: no chest pain Respiratory:  no cough and no shortness of breath Gastrointestinal:  no abdominal pain, no change in bowel habits GU:  Female: negative for dysuria or pelvic pain Musculoskeletal/Extremities:  no pain of the joints Integumentary (Skin/Breast):  no abnormal skin lesions  reported Neurologic:  no headaches Endocrine:  denies fatigue Hematologic/Lymphatic:  No areas of easy bleeding  Exam BP 126/78 (BP Location: Left Arm, Patient Position: Sitting)   Pulse 75   Temp 98 F (36.7 C) (Oral)   Resp 16   Ht 5' 3 (1.6 m)   Wt 222 lb 12.8 oz (101.1 kg)   SpO2 100%   BMI 39.47 kg/m  General:  well developed, well nourished, in no apparent distress Skin:  no significant moles, warts, or growths Head:  no masses, lesions, or tenderness Eyes:  pupils equal and round, sclera anicteric without injection Ears:  canals without lesions, TMs shiny without retraction, no obvious effusion, no erythema Nose:  nares  patent, mucosa normal, and no drainage  Throat/Pharynx:  lips and gingiva without lesion; tongue and uvula midline; non-inflamed pharynx; no exudates or postnasal drainage Neck: neck supple without adenopathy, thyromegaly, or masses Lungs:  clear to auscultation, breath sounds equal bilaterally, no respiratory distress Cardio:  regular rate and rhythm, no bruits, no LE edema Abdomen:  abdomen soft, nontender; bowel sounds normal; no masses or organomegaly Genital: Defer to GYN Musculoskeletal:  symmetrical muscle groups noted without atrophy or deformity Extremities:  no clubbing, cyanosis, or edema, no deformities, no skin discoloration Neuro:  gait normal; deep tendon reflexes normal and symmetric Psych: well oriented with normal range of affect and appropriate judgment/insight  Assessment and Plan  Well adult exam - Plan: CBC, Comprehensive metabolic panel with GFR, Lipid panel, B12 and Folate Panel   Well 38 y.o. female. Counseled on diet and exercise. Advanced directive form provided today.  Other orders as above. Follow up in 6 mo. The patient voiced understanding and agreement to the plan.  Mabel [place], DO 04/01/24 10:09 AM

## 2024-04-01 NOTE — Patient Instructions (Signed)
 Give [place] 2-3 business days to get the results of your labs back.   Keep the diet clean and stay active.  Please get me a copy of your advanced directive form at your convenience.   Let us know if you need anything.

## 2024-04-04 ENCOUNTER — Other Ambulatory Visit (HOSPITAL_BASED_OUTPATIENT_CLINIC_OR_DEPARTMENT_OTHER): Payer: Self-pay

## 2024-04-05 LAB — COMPREHENSIVE METABOLIC PANEL WITH GFR
ALT: 15 U/L (ref 0–35)
AST: 14 U/L (ref 0–37)
Albumin: 4.1 g/dL (ref 3.5–5.2)
Alkaline Phosphatase: 73 U/L (ref 39–117)
BUN: 12 mg/dL (ref 6–23)
CO2: 22 meq/L (ref 19–32)
Calcium: 9.2 mg/dL (ref 8.4–10.5)
Chloride: 112 meq/L (ref 96–112)
Creatinine, Ser: 1.11 mg/dL (ref 0.40–1.20)
GFR: 62.91 mL/min (ref >60.00)
Glucose, Bld: 91 mg/dL (ref 70–99)
Potassium: 4.2 meq/L (ref 3.5–5.1)
Sodium: 138 meq/L (ref 135–145)
Total Bilirubin: 0.2 mg/dL (ref 0.2–1.2)
Total Protein: 6.8 g/dL (ref 6.0–8.3)

## 2024-04-12 ENCOUNTER — Encounter: Payer: Self-pay | Admitting: Family Medicine

## 2024-04-14 NOTE — Telephone Encounter (Signed)
 Called pt was advised per to schedule appt, if have SOB go ER. Pt said no SOB and she would call  Back to make the appt.

## 2024-04-21 ENCOUNTER — Ambulatory Visit: Admitting: Neurology

## 2024-04-22 ENCOUNTER — Ambulatory Visit: Admitting: Neurology

## 2024-04-22 ENCOUNTER — Encounter: Payer: Self-pay | Admitting: Neurology

## 2024-04-22 ENCOUNTER — Telehealth: Payer: Self-pay | Admitting: Neurology

## 2024-04-22 VITALS — BP 126/85 | HR 97 | Ht 63.0 in | Wt 224.0 lb

## 2024-04-22 DIAGNOSIS — G932 Benign intracranial hypertension: Secondary | ICD-10-CM

## 2024-04-22 DIAGNOSIS — G43001 Migraine without aura, not intractable, with status migrainosus: Secondary | ICD-10-CM

## 2024-04-22 NOTE — Patient Instructions (Signed)
 I would like you to get another eye exam with Dr. Octavia look for recurrence of papilledema and assess for worsening visual fields.  If positive, would order spinal tap and check imaging of the sinuses (big veins) in the brain If eye exam is unremarkable, would increase amitriptyline . When you have the eye exam, have them fax report to me and send me a mychart message so I know to look out for the report. Further recommendations pending results.  Otherwise, follow up 6 months.

## 2024-04-22 NOTE — Telephone Encounter (Signed)
 Pt called this afternoon and she stated that she was told that she can not schedule a eye appointment until the Dr. Amedeo notes in. [facility] stated that they are over booked so in order for Pt to be seen they need Dr. Notes. Thanks

## 2024-04-22 NOTE — Progress Notes (Signed)
 NEUROLOGY CONSULTATION NOTE  [name] MRN: [id number] DOB: [birth year]  Referring provider: Mabel Pry, DO Primary care provider: Mabel Pry, DO  Reason for consult:  second opinion regarding idiopathic intracranial hypertension  Assessment/Plan:   Idiopathic intracranial hypertension - she has had a recurrence of her habitual symptoms since her last eye exam.   Migraine without aura, without status migrainosus, not intractable    Continue acetazolamide  500mg  twice daily for now.  Since she is symptomatic with visual disturbance, I would recommend having a repeat eye exam with Dr. Octavia.  If there is recurrence of optic nerve edema, would schedule LP to check opening pressure and assess for improvement of symptoms (as she states she cannot tolerate increasing acetazolamide ) and check CTV head. If eye exam does not demonstrate optic nerve edema or visual field deficits, would treat for chronic headache by increasing amitriptyline  to 50mg  at bedtime.   Total time spent on today's visit was 66 minutes dedicated to this patient today, preparing to see patient, examining the patient, ordering tests and/or medications and counseling the patient, documenting clinical information in the EHR or other health record, independently interpreting results and communicating results to the patient/family, discussing treatment and goals, answering patient's questions and coordinating care.     Subjective:  Helen Wells is a 38 year old right-handed female with ADHD, HTN, HLD, anxiety and clotting disorder who presents for second opinion regarding idiopathic intracranial hypertension.  History supplemented by prior neurologist's and referring provider's notes.  Patient began experiencing migraines at age 3 years old.  Described as severe bilateral retro-orbital pounding pain with nausea and phonophobia.  They were infrequent up until 38 years old when she started her  menstrual cycle and would occur about 7 times a month.  By her early 93s, they were occurring daily but subsequently stopped at age 43.  They returned in 2012.  She found out she had a gluten allergy.  Once she started a gluten-free diet, the migraines resolved in 2016.  Prior medications used included sumatriptan, rizatriptan , Fioricet , nortriptyline  (side effects), and topiramate (aggravated depression).  When she was 38 years old, she began experiencing new headache, described as a dull pressure across the forehead.  She started experiencing blurred vision and floaters when she subsequently lost her vision.  She had an eye exam which revealed papilledema.  She underwent an LP which revealed elevated opening pressure, confirming idiopathic intracranial hypertension.  She noticed fairly immediate relief and her vision completely returned within one week. She was started on Diamox  but discontinued it after a year because she was doing well.  In late 2023, she began experiencing the bifrontal headaches with blurred vision again.  She was seen by an optometrist in November 2023 who noted mild optic edema and was restarted on acetazolamide .  MRI of brain with and without contrast on 06/14/2022 revealed small size of the ventricular system and partial empty sella which may be seen in setting of IIH but no acute findings.  She was referred to neurology and underwent lumbar puncture in December 2023 which demonstrated opening pressure of 28 cm water .  Symptoms resolved but she then had recurrence of headache and blurred vision several months later.  She was seen by ophthalmology, Dr. Octavia, in May 2024 whose exam revealed bilateral mild papillary edema, likely chronic findings with full visual fields without enlarged blind spots.  She had a repeat LP that month, which revealed opening pressure of 24 cm water  which improved headache and  blurred vision.  She remained on Diamox .  She had repeat eye exam with Dr. Octavia in  April 2025 whose exam revealed full visual fields and no papilledema.    However, she had a recurrence of the headache beginning in July 2025 which has been persistent.  She describes a dull bilateral retro-orbital pressure without nausea, vomiting, photophobia, phonophobia.  It is not positional.  She notes some hazy vision in the periphery of both eyes.  He has not had any episodes of vision loss.  She denies pulsatile tinnitus.  Twice a month, the headache may become severe for 2 days.  She feels that the Diamox  is keeping it from getting worse but she does not think she would tolerate a higher dose due to the paresthesias.  She was previously on furosemide  and HCTZ, which were ineffective.  Topiramate caused aggravation of depression.  She has not had any weight gain.  She has an IUD.  Does not frequently take analgesics.     Past NSAIDS/analgesics:  Fioricet , Toradol   Past abortive triptans:  rizatriptan , sumatriptan Past abortive ergotamine:  none Past muscle relaxants:  cyclobenzaprine  Past anti-emetic:  ondansetron  Past antihypertensive medications:  furosemide , hydrochlorothiazide, amlodipine  Past antidepressant medications:  nortriptyline  (side effects), amitriptyline  Past anticonvulsant medications:  topiramate (aggravated depression) Past anti-CGRP:  Ajovy    Current NSAIDS/analgesics:  acetaminophen  1000mg  Current triptans:  none Current ergotamine:  none Current anti-emetic:  none Current muscle relaxants:  none Current Antihypertensive medications:  lisinopril  20mg  daily Current Antidepressant medications:  amitriptyline  25mg  at bedtime Current Anticonvulsant medications:  acetazolamide  500mg  twice daily Current anti-CGRP:  none Current Vitamins/Herbal/Supplements:  ferrous sulfate, B complex, folic acid  Current Antihistamines/Decongestants:  none Other therapy:  none Birth control:  IUD   Caffeine :  soda 3 times a week.  Rarely coffee Diet:  64 oz water  daily.   Gluten-free.  Usually eats light and only dinner (meat, vegetables) Exercise:  walks Depression:  stable; Anxiety:  stable Other pain:  chronic back pain Sleep hygiene:  poor.  Trouble falling asleep.    History of TBI/concussion:  no Family history of headache:  mother and father (migraines) Family history of cerebral aneurysm:  no  CMP from last month was normal.   PAST MEDICAL HISTORY: Past Medical History:  Diagnosis Date   ADHD (attention deficit hyperactivity disorder)    Anemia    Anemia    Anxiety    Clotting disorder    Headache    Hyperlipemia    Hypertension    Hypertension     PAST SURGICAL HISTORY: Past Surgical History:  Procedure Laterality Date   Gallbladder Removal 2017     lancinectomy and Discectomy 2021     Dr Sherryl - Neurosurgeon   ROBOTIC ASSITED PARTIAL NEPHRECTOMY Left 08/01/2023   Procedure: XI ROBOTIC ASSITED LEFT PARTIAL NEPHRECTOMY WITH ULTRASOUND;  Surgeon: Alvaro Ricardo KATHEE Mickey., MD;  Location: [facility];  Service: Urology;  Laterality: Left;  180 MINUTES    MEDICATIONS: Current Outpatient Medications on File Prior to Visit  Medication Sig Dispense Refill   acetaminophen  (TYLENOL ) 500 MG tablet Take 1,000 mg by mouth every 6 (six) hours as needed (pain.).     acetaZOLAMIDE  (DIAMOX ) 250 MG tablet Take 2 tablets (500 mg total) by mouth 2 (two) times daily. 120 tablet 11   amitriptyline  (ELAVIL ) 25 MG tablet Take 1 tablet (25 mg total) by mouth at bedtime. 90 tablet 2   b complex vitamins capsule Take 1 capsule by mouth daily.  ferrous sulfate 324 MG TBEC Take 324 mg by mouth.     fluconazole  (DIFLUCAN ) 150 MG tablet Take 1 tab, repeat in 72 hours if no improvement. 2 tablet 0   folic acid  (FOLVITE ) 1 MG tablet Take 1 mg by mouth daily.     lisinopril  (ZESTRIL ) 20 MG tablet Take 1 tablet (20 mg total) by mouth daily. 90 tablet 1   No current facility-administered medications on file prior to visit.    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 Other reaction(s): Other Informed by her MD not to take any Tetracyclines. Informed by her MD not to take any Tetracyclines. Blind    Erythromycin Base     Other reaction(s): Other (See Comments) Other    Gluten Meal Diarrhea    Acid reflux/knots in stomach   Macrolides And Ketolides     Other reaction(s): Other (See Comments), Other (See Comments) Blindness, Pseudotumor Cerebri  Blindness, Pseudotumor Cerebri      FAMILY HISTORY: Family History  Problem Relation Age of Onset   Hypertension Mother    Arthritis Mother    High blood pressure Mother    Hypertension Father    Birth defects Father    High Cholesterol Father    Learning disabilities Father    Alzheimer's disease Son     Objective:  Blood pressure 126/85, pulse 97, height 5' 3 (1.6 m), weight 224 lb (101.6 kg), SpO2 97%. General: No acute distress.  Patient appears well-groomed.   Head:  Normocephalic/atraumatic Eyes:  fundi examined but not visualized Neck: supple, no paraspinal tenderness, full range of motion Heart: regular rate and rhythm Neurological Exam: Mental status: alert and oriented to person, place, and time, speech fluent and not dysarthric, language intact. Cranial nerves: CN I: not tested CN II: pupils equal, round and reactive to light, visual fields intact CN III, IV, VI:  full range of motion, no nystagmus, no ptosis CN V: facial sensation intact. CN VII: upper and lower face symmetric CN VIII: hearing intact CN IX, X: gag intact, uvula midline CN XI: sternocleidomastoid and trapezius muscles intact CN XII: tongue midline Bulk &  Tone: normal, no fasciculations. Motor:  muscle strength 5/5 throughout Sensation:  Pinprick and vibratory sensation intact. Deep Tendon Reflexes:  2+ throughout,  toes downgoing.   Finger to nose testing:  Without dysmetria.   Gait:  Normal station and stride.  Romberg negative.    Thank you for allowing me to take part in the care of this patient.  Juliene Dunnings, DO  CC: Mabel Pry, DO

## 2024-04-23 ENCOUNTER — Encounter: Payer: Self-pay | Admitting: Neurology

## 2024-04-23 NOTE — Telephone Encounter (Signed)
 Records faxed via epic and fax machine

## 2024-04-25 ENCOUNTER — Other Ambulatory Visit: Payer: Self-pay | Admitting: Medical Genetics

## 2024-04-25 DIAGNOSIS — Z006 Encounter for examination for normal comparison and control in clinical research program: Secondary | ICD-10-CM

## 2024-04-28 ENCOUNTER — Telehealth: Payer: Self-pay | Admitting: Neurology

## 2024-04-28 DIAGNOSIS — G932 Benign intracranial hypertension: Secondary | ICD-10-CM | POA: Diagnosis not present

## 2024-04-28 DIAGNOSIS — R519 Headache, unspecified: Secondary | ICD-10-CM | POA: Diagnosis not present

## 2024-04-28 DIAGNOSIS — E236 Other disorders of pituitary gland: Secondary | ICD-10-CM | POA: Diagnosis not present

## 2024-04-28 NOTE — Telephone Encounter (Signed)
 Received eye exam results from [facility].  No optic nerve swelling noted.  But that doesn't rule out increased intracranial pressure.  My plan was to increase amitriptyline  if eye exam was okay.  But I think we should go ahead and check another spinal tap to make sure that the pressure isn't currently elevated.  If it is elevated, then we can treat accordingly.  If it is within normal range, we can increase amitriptyline .  I believe that radiology requires a brain scan within 6 months of the spinal tap.  If that is correct, will order CT head.  Under LP order, instruct measuring opening pressure with patient in lateral decubitus and check CSF cell count, protein, glucose, gram stain and culture and cytology.

## 2024-04-29 ENCOUNTER — Other Ambulatory Visit: Payer: Self-pay | Admitting: Family Medicine

## 2024-04-29 NOTE — Addendum Note (Signed)
 Addended by: OZELL JESUSA PARAS on: 04/29/2024 11:38 AM   Modules accepted: Orders

## 2024-04-29 NOTE — Telephone Encounter (Signed)
 Patient advised of note below

## 2024-04-29 NOTE — Addendum Note (Signed)
 Addended by: OZELL JESUSA PARAS on: 04/29/2024 01:07 PM   Modules accepted: Orders

## 2024-04-30 NOTE — Progress Notes (Signed)
 [employer]. Informed that CPT [phone] and CPT [id number] does not require a prior authorization. [id number].

## 2024-05-02 ENCOUNTER — Ambulatory Visit
Admission: RE | Admit: 2024-05-02 | Discharge: 2024-05-02 | Disposition: A | Discharge status: Home / Self Care | Source: Ambulatory Visit | Attending: Neurology | Admitting: Neurology

## 2024-05-02 DIAGNOSIS — R519 Headache, unspecified: Secondary | ICD-10-CM | POA: Diagnosis not present

## 2024-05-02 DIAGNOSIS — G932 Benign intracranial hypertension: Secondary | ICD-10-CM

## 2024-05-06 ENCOUNTER — Ambulatory Visit: Payer: Self-pay | Admitting: Neurology

## 2024-05-06 DIAGNOSIS — G932 Benign intracranial hypertension: Secondary | ICD-10-CM

## 2024-05-06 NOTE — Progress Notes (Signed)
 Patient advised. LP scheduled for 05/23/24.

## 2024-05-14 ENCOUNTER — Encounter: Payer: Self-pay | Admitting: Physical Medicine & Rehabilitation

## 2024-05-16 ENCOUNTER — Other Ambulatory Visit (INDEPENDENT_AMBULATORY_CARE_PROVIDER_SITE_OTHER)

## 2024-05-16 ENCOUNTER — Other Ambulatory Visit

## 2024-05-16 DIAGNOSIS — E7800 Pure hypercholesterolemia, unspecified: Secondary | ICD-10-CM

## 2024-05-16 LAB — LIPID PANEL
Cholesterol: 204 mg/dL — ABNORMAL HIGH (ref 0–200)
HDL: 41.3 mg/dL (ref >39.00)
LDL Cholesterol: 132 mg/dL — ABNORMAL HIGH (ref 0–99)
NonHDL: 162.67
Total CHOL/HDL Ratio: 5
Triglycerides: 151 mg/dL — ABNORMAL HIGH (ref 0.0–149.0)
VLDL: 30.2 mg/dL (ref 0.0–40.0)

## 2024-05-17 ENCOUNTER — Ambulatory Visit: Payer: Self-pay | Admitting: Family Medicine

## 2024-05-19 ENCOUNTER — Other Ambulatory Visit (HOSPITAL_BASED_OUTPATIENT_CLINIC_OR_DEPARTMENT_OTHER): Payer: Self-pay

## 2024-05-19 ENCOUNTER — Other Ambulatory Visit: Payer: Self-pay

## 2024-05-19 MED ORDER — ESOMEPRAZOLE MAGNESIUM 20 MG PO CPDR
20.0000 mg | DELAYED_RELEASE_CAPSULE | Freq: Every day | ORAL | 0 refills | Status: DC
  Filled 2024-05-19: qty 10, 10d supply, fill #0

## 2024-05-19 MED ORDER — CELECOXIB 100 MG PO CAPS
100.0000 mg | ORAL_CAPSULE | Freq: Two times a day (BID) | ORAL | 0 refills | Status: DC
  Filled 2024-05-19: qty 20, 10d supply, fill #0

## 2024-05-19 NOTE — Addendum Note (Signed)
 Addended by: CARILYN PRENTICE BRAVO on: 05/19/2024 11:12 AM   Modules accepted: Orders

## 2024-05-22 NOTE — Discharge Instructions (Signed)

## 2024-05-23 ENCOUNTER — Other Ambulatory Visit (HOSPITAL_COMMUNITY)
Admission: RE | Admit: 2024-05-23 | Discharge: 2024-05-23 | Disposition: A | Discharge status: Home / Self Care | Source: Ambulatory Visit | Attending: Neurology | Admitting: Neurology

## 2024-05-23 ENCOUNTER — Ambulatory Visit
Admission: RE | Admit: 2024-05-23 | Discharge: 2024-05-23 | Disposition: A | Discharge status: Home / Self Care | Source: Ambulatory Visit | Attending: Neurology | Admitting: Neurology

## 2024-05-23 VITALS — BP 180/97 | HR 68

## 2024-05-23 DIAGNOSIS — G932 Benign intracranial hypertension: Secondary | ICD-10-CM | POA: Diagnosis not present

## 2024-05-23 NOTE — Telephone Encounter (Signed)
-----   Message from Juliene Lamar Dunnings sent at 05/23/2024 12:42 PM EST ----- The spinal fluid pressure is elevated even on the Diamox  500mg  twice daily.  Normally, I would advise to increase dose but patient has told me she cannot tolerate higher doses.  She did not  respond/tolerate other typical medications for IIH, such as topiramate and furosemide .  Therefore, we are left with possible surgical options.  I can refer her to a neurosurgeon, Dr. Janja, who is  upstairs in our building.   ----- Message ----- From: Interface, Rad Results In Sent: 05/23/2024   9:32 AM EST To: Juliene JONELLE Dunnings, DO

## 2024-05-23 NOTE — Telephone Encounter (Signed)
 Patient advised. Patient agreed tot he Referral.   Referral sent to Dr.Janja office.

## 2024-05-26 LAB — CYTOLOGY - NON PAP

## 2024-05-27 ENCOUNTER — Ambulatory Visit: Admitting: Neurosurgery

## 2024-05-27 ENCOUNTER — Encounter: Payer: Self-pay | Admitting: Neurosurgery

## 2024-05-27 VITALS — BP 124/90 | HR 73 | Temp 97.6°F | Ht 63.0 in | Wt 221.8 lb

## 2024-05-27 DIAGNOSIS — G932 Benign intracranial hypertension: Secondary | ICD-10-CM | POA: Diagnosis not present

## 2024-05-27 NOTE — Progress Notes (Unsigned)
 Assessment : Discussed the use of AI scribe software for clinical note transcription with the patient, who gave verbal consent to proceed.  History of Present Illness Helen Wells is a 38 year old female with intracranial hypertension who presents with persistent headaches and blurred vision. She is accompanied by her husband. She was referred by Dr. Skeet for evaluation of persistent headaches and increased intracranial pressure.  She has a long-standing history of intracranial hypertension, initially diagnosed at age 45, which was thought to be triggered by erythromycin for acne. At that time, she underwent a spinal tap and was treated with Diamox  for a year, which resolved her symptoms until two and a half years ago.  Two and a half years ago, her symptoms recurred after taking steroids for back issues. She underwent multiple spinal taps and was treated with Lasix  and then Diamox , with varying doses. Her headaches typically resolve after spinal taps, but this time, despite a recent spinal tap, her headache persists.  Her headaches are associated with blurred vision and 'swimming eye' when fluid builds up. Her headaches return when her intracranial pressure increases, typically measured at 28-29 mmHg during spinal taps. Her vision was lost at age 68 when her pressure was 51 mmHg, but it returned after treatment.  Since June, she has experienced persistent headaches, blurry vision, and 'black floaties' in her eyes. She describes her headache as sitting on her eyebrow line and reports weakness in her legs and fluctuating blood pressure. Despite a recent spinal tap, her symptoms have not resolved.  She has a history of papilledema, which she reports is constant. She was seen by an eye specialist in June and a month before her last spinal tap, who did not recommend further spinal taps at those times.  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 She also reports high cholesterol.  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    Plan : I had a lengthy conversation with her about her 38 year old journey with idiopathic intracranial hypertension.  I am really happy to hear that for the longest time her headaches were able to be treated with intermittent lumbar punctures and they abated for many months to years but unfortunately this latest 1 has not resulted in any reduction of her headaches.  She is very leery about trying different medication because that is what I offered her because she has not given a try to zonisamide.  She could not tolerate Topamax in the past and currently is on 1000 mg of Diamox  a day.  I explained to her the connection of idiopathic intracranial hypertension with plate and she says that she has several times lost significant weight but it rapidly returns.  I told her that she needs to talk to her primary care doctor about a consistent path toward lasting weight loss.  Secondly, I talked to her about stents versus shunts and explained to her what each entails.  I would like to get an MRV first.  Her husband interjected and he wants her to try medication as well and I told him that it is important for her to give this some consideration and go home and think about whether she would like to try zonisamide and after the MRV is done, she is going to come back and see me.  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   Social History   Socioeconomic History   Marital status: Married    Spouse name: Richard   Number of children: 2   Years of education: Not on file   Highest education level: Bachelor's degree (e.g., BA, AB, BS)   Occupational History   Not on file  Tobacco Use   Smoking status: Never   Smokeless tobacco: Never  Vaping Use   Vaping status: Never Used  Substance and Sexual Activity   Alcohol use: Yes    Alcohol/week: 1.0 standard drink of alcohol    Types: 1 Shots of liquor per week    Comment: 1 drink per month   Drug use: Never   Sexual activity: Yes    Birth control/protection: None  Other Topics Concern   Not on file  Social History Narrative   ** Merged History Encounter **          Right handed 3   Social Drivers of Health   Financial Resource Strain: Not on file  Food Insecurity: No Food Insecurity (08/01/2023)   Hunger Vital Sign    Worried About Running Out of Food in the Last Year: Never true    Ran Out of Food in the Last Year: Never true  Transportation Needs: No Transportation Needs (08/01/2023)   PRAPARE - Administrator, Civil Service (Medical): No    Lack of Transportation (Non-Medical): No  Physical Activity: Not on file  Stress: Stress Concern Present (06/02/2020)   Received from [facility] - Occupational Stress Questionnaire    Feeling of Stress : To some extent  Social Connections: Unknown (11/14/2021)   Received from [facility]   Social Network    Social Network: Not on file  Intimate Partner Violence: Not At Risk (08/01/2023)   Humiliation, Afraid, Rape, and Kick questionnaire    Fear of Current or Ex-Partner: No    Emotionally Abused: No    Physically Abused: No    Sexually Abused: No    Family History  Problem Relation Age of Onset   Migraines Mother    Hypertension Mother    Arthritis Mother    High blood pressure Mother    Stroke Father    Migraines Father    Hypertension Father    Birth defects Father    High Cholesterol Father    Learning disabilities Father    Stroke Sister    Alzheimer's disease Son    Dementia Maternal Grandmother    Parkinsonism Paternal Grandmother     Stroke Paternal Grandfather     Allergies  Allergen Reactions   Bupivacaine -Meloxicam Er     Other reaction(s): Unknown   Erythromycin     Blind Other reaction(s): Other 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 Other reaction(s): Other Informed by her MD not to take any Tetracyclines. Informed by her MD not to take any Tetracyclines. Blind    Erythromycin Base     Other reaction(s): Other (See Comments) Other    Gluten Meal Diarrhea    Acid reflux/knots in stomach   Macrolides And Ketolides  Other reaction(s): Other (See Comments), Other (See Comments) Blindness, Pseudotumor Cerebri  Blindness, Pseudotumor Cerebri      Past Medical History:  Diagnosis Date   ADHD (attention deficit hyperactivity disorder)    Anemia    Anemia    Anxiety    Clotting disorder    Headache    Hyperlipemia    Hypertension    Hypertension     Past Surgical History:  Procedure Laterality Date   Gallbladder Removal 2017     lancinectomy and Discectomy 2021     Dr Sherryl - Neurosurgeon   ROBOTIC ASSITED PARTIAL NEPHRECTOMY Left 08/01/2023   Procedure: XI ROBOTIC ASSITED LEFT PARTIAL NEPHRECTOMY WITH ULTRASOUND;  Surgeon: Alvaro Ricardo KATHEE Mickey., MD;  Location: [facility];  Service: Urology;  Laterality: Left;  180 MINUTES     Physical Exam   Physical Exam HENT:     Head: Normocephalic.     Nose: Nose normal.  Eyes:     Pupils: Pupils are equal, round, and reactive to light.  Cardiovascular:     Rate and Rhythm: Normal rate.  Pulmonary:     Effort: Pulmonary effort is normal.  Abdominal:     General: Abdomen is flat.  Musculoskeletal:     Cervical back: Normal range of motion.  Neurological:     Mental Status: She is alert.     Cranial Nerves: Cranial nerves 2-12 are intact.      Sensory: Sensation is intact.     Motor: Motor function is intact.     Coordination: Coordination is intact.     Results for orders placed or performed during the hospital encounter of 05/02/24  CT HEAD WO CONTRAST ( )   Narrative   EXAM: CT HEAD WITHOUT CONTRAST 05/02/2024 10:49:59 AM  TECHNIQUE: CT of the head was performed without the administration of intravenous contrast. Automated exposure control, iterative reconstruction, and/or weight based adjustment of the mA/kV was utilized to reduce the radiation dose to as low as reasonably achievable.  COMPARISON: Brain MRI 06/14/2022. Head CT 09/22/2022.  CLINICAL HISTORY: Headache, intracranial hypertension features. Chronic headaches; Intracranial hypertension.  FINDINGS:  BRAIN AND VENTRICLES: No acute hemorrhage. No evidence of acute infarct. No hydrocephalus. No extra-axial collection. No mass effect or midline shift.  Chronic Partially empty, expanded sella. No suspicious intracranial vascular hyperdensity.  ORBITS: No acute abnormality.  SINUSES: No acute abnormality.  SOFT TISSUES AND SKULL: No acute soft tissue abnormality. No skull fracture.  IMPRESSION: 1. No acute intracranial abnormality. 2. Chronic Partially empty, expanded sella which can be a normal anatomic variant but also is associated with idiopathic intracranial hypertension (pseudotumor cerebri).  Electronically signed by: Helayne Hurst MD 05/06/2024 03:52 AM EDT RP Workstation: [id number]   Results for orders placed or performed during the hospital encounter of 06/14/22  MR Brain W Wo Contrast   Narrative   CLINICAL DATA:  Chronic nonintractable headache, unspecified headache type R51.9, G89.29 (ICD-10-CM).  EXAM: MRI HEAD WITHOUT AND WITH CONTRAST  TECHNIQUE: Multiplanar, multiecho pulse sequences of the brain and surrounding structures were obtained without and with intravenous contrast.  CONTRAST:  10mL GADAVIST  GADOBUTROL  1  MMOL/ML IV SOLN  COMPARISON:  Head CT July 17, 2020.  FINDINGS: Brain: No acute infarction, hemorrhage, hydrocephalus, extra-axial collection or mass lesion.  Small size of the ventricular system and partial empty sella may be seen in the setting idiopathic intracranial hypertension no focus of abnormal contrast enhancement identified.  Vascular: Normal flow voids.  Skull and upper cervical spine: Normal marrow  signal.  Sinuses/Orbits: Negative.  Other: None.  IMPRESSION: 1. No acute intracranial abnormality. 2. 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 Correlation with opening pressure suggested.   Electronically Signed   By: Katyucia  de Macedo Rodrigues M.D.   On: 06/15/2022 15:25

## 2024-05-28 ENCOUNTER — Encounter: Payer: Self-pay | Admitting: Neurosurgery

## 2024-05-28 ENCOUNTER — Encounter: Payer: Self-pay | Admitting: Neurology

## 2024-05-29 ENCOUNTER — Other Ambulatory Visit (HOSPITAL_BASED_OUTPATIENT_CLINIC_OR_DEPARTMENT_OTHER): Payer: Self-pay

## 2024-05-29 ENCOUNTER — Ambulatory Visit
Admission: RE | Admit: 2024-05-29 | Discharge: 2024-05-29 | Disposition: A | Discharge status: Home / Self Care | Source: Ambulatory Visit | Attending: Neurosurgery | Admitting: Neurosurgery

## 2024-05-29 ENCOUNTER — Other Ambulatory Visit: Payer: Self-pay | Admitting: Neurology

## 2024-05-29 DIAGNOSIS — G932 Benign intracranial hypertension: Secondary | ICD-10-CM

## 2024-05-29 MED ORDER — RIZATRIPTAN BENZOATE 10 MG PO TBDP
10.0000 mg | ORAL_TABLET | ORAL | 5 refills | Status: AC | PRN
  Filled 2024-05-29: qty 10, 30d supply, fill #0

## 2024-06-03 ENCOUNTER — Encounter (HOSPITAL_COMMUNITY): Payer: Self-pay

## 2024-06-03 ENCOUNTER — Ambulatory Visit: Admitting: Neurosurgery

## 2024-06-03 ENCOUNTER — Emergency Department (HOSPITAL_COMMUNITY)

## 2024-06-03 ENCOUNTER — Telehealth: Payer: Self-pay

## 2024-06-03 ENCOUNTER — Other Ambulatory Visit: Payer: Self-pay

## 2024-06-03 ENCOUNTER — Encounter: Payer: Self-pay | Admitting: Neurosurgery

## 2024-06-03 ENCOUNTER — Emergency Department (HOSPITAL_COMMUNITY)
Admission: EM | Admit: 2024-06-03 | Discharge: 2024-06-03 | Disposition: A | Discharge status: Home / Self Care | Attending: Emergency Medicine | Admitting: Emergency Medicine

## 2024-06-03 VITALS — BP 131/88 | HR 78 | Temp 98.8°F | Ht 63.0 in | Wt 220.0 lb

## 2024-06-03 DIAGNOSIS — I10 Essential (primary) hypertension: Secondary | ICD-10-CM | POA: Diagnosis not present

## 2024-06-03 DIAGNOSIS — R519 Headache, unspecified: Secondary | ICD-10-CM | POA: Diagnosis not present

## 2024-06-03 DIAGNOSIS — G932 Benign intracranial hypertension: Secondary | ICD-10-CM

## 2024-06-03 LAB — CBC WITH DIFFERENTIAL/PLATELET
Abs Immature Granulocytes: 0.02 K/uL (ref 0.00–0.07)
Basophils Absolute: 0 K/uL (ref 0.0–0.1)
Basophils Relative: 1 %
Eosinophils Absolute: 0.1 K/uL (ref 0.0–0.5)
Eosinophils Relative: 1 %
HCT: 37.2 % (ref 36.0–46.0)
Hemoglobin: 12.2 g/dL (ref 12.0–15.0)
Immature Granulocytes: 0 %
Lymphocytes Relative: 31 %
Lymphs Abs: 2.4 K/uL (ref 0.7–4.0)
MCH: 28.2 pg (ref 26.0–34.0)
MCHC: 32.8 g/dL (ref 30.0–36.0)
MCV: 86.1 fL (ref 80.0–100.0)
Monocytes Absolute: 0.6 K/uL (ref 0.1–1.0)
Monocytes Relative: 8 %
Neutro Abs: 4.5 K/uL (ref 1.7–7.7)
Neutrophils Relative %: 59 %
Platelets: 314 K/uL (ref 150–400)
RBC: 4.32 MIL/uL (ref 3.87–5.11)
RDW: 13.5 % (ref 11.5–15.5)
WBC: 7.7 K/uL (ref 4.0–10.5)
nRBC: 0 % (ref 0.0–0.2)

## 2024-06-03 LAB — COMPREHENSIVE METABOLIC PANEL WITH GFR
ALT: 20 U/L (ref 0–44)
AST: 17 U/L (ref 15–41)
Albumin: 3.5 g/dL (ref 3.5–5.0)
Alkaline Phosphatase: 79 U/L (ref 38–126)
Anion gap: 13 (ref 5–15)
BUN: 9 mg/dL (ref 6–20)
CO2: 24 mmol/L (ref 22–32)
Calcium: 8.9 mg/dL (ref 8.9–10.3)
Chloride: 102 mmol/L (ref 98–111)
Creatinine, Ser: 0.98 mg/dL (ref 0.44–1.00)
GFR, Estimated: >60 mL/min (ref >60)
Glucose, Bld: 98 mg/dL (ref 70–99)
Potassium: 3.5 mmol/L (ref 3.5–5.1)
Sodium: 139 mmol/L (ref 135–145)
Total Bilirubin: 0.5 mg/dL (ref 0.0–1.2)
Total Protein: 7.2 g/dL (ref 6.5–8.1)

## 2024-06-03 LAB — HCG, SERUM, QUALITATIVE: Preg, Serum: NEGATIVE

## 2024-06-03 MED ORDER — DEXAMETHASONE SOD PHOSPHATE PF 10 MG/ML IJ SOLN: 10.0000 mg | Freq: Once | INTRAMUSCULAR | Status: DC

## 2024-06-03 MED ORDER — PROCHLORPERAZINE EDISYLATE 10 MG/2ML IJ SOLN: 10.0000 mg | Freq: Once | INTRAMUSCULAR | Status: DC

## 2024-06-03 MED ORDER — DIPHENHYDRAMINE HCL 50 MG/ML IJ SOLN: 12.5000 mg | Freq: Once | INTRAMUSCULAR | Status: DC

## 2024-06-03 NOTE — Progress Notes (Signed)
 I went by the emergency room just now and spoke to her. She says that she still has a headache, but her vision is much better.  She said that she's definitely going to go to the weight loss clinic and if her vision stays good, will want to pursue the stent.  I reminded her that the pressure measurements will determine whether she's a candidate for this or not. Secondly, she needs to remember that once the stent is in, she will have to be on Plavix for three months, and during that time, needing a shunt will have to be weighed against stopping the Plavix and the risk of thrombosis of the stent. She voiced understanding of this. She's going to think about this and she will call me with what she wants to do.

## 2024-06-03 NOTE — Progress Notes (Signed)
 38 year old lady with long history of idiopathic intracranial hypertension who I saw her last week.  She always gets relief after the lumbar puncture but this time, she did not have this and continues to have blurry vision.  She returned with the MRV and I reviewed this with her and her husband.  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  I had a lengthy conversation with her and her husband: The conversation was recorded by her husband on his phone.  I explained to her that she has the following options. First option would be is to try zonisamide.  The benefit of this would be is that this is the least invasive option however this is not going to help with her visual loss immediately.  The second option would be is to pursue weight loss.  The problem with this is that this is a long-term solution and regardless of what we do she will have to pursue this anyway as I had discussed with her at her last visit.  This again is not going to help with the current situation but it is an option if she wants to take the risk of visual problems.  Third option would be an endovascular option and I explained the benefit of this would be the noninvasive nature however the drawback is that it is a two-step procedure with the pressure measurements followed by stent placement.  We talked about the time course of this and we also talked about the need for anticoagulation with antiplatelet agents for 3 months thereafter.  The last option would be a shunt placement.  This would lead to immediate risk duction of visual problems however carries the risk of intracerebral hemorrhage and abdominal discomfort from the tubing and is the most invasive option.  I talked to her about this in many different ways repeatedly and she was very emotional about this because she is afraid of going blind get at the same time she is also afraid of the procedure.  After many discussion in many different ways with different  angles with different approaches, we decided that she is going to go to the emergency room and get a stat lumbar puncture.  I called to radiologist as well as the imaging center but could not get a hold of anybody and therefore I am sending her to the emergency room.  I was very clear about my intentions: I told her that the only thing that is going to save her vision the most is a reduction in her intracranial pressure which would be the most successful with a ventriculoperitoneal shunt.  She is very hesitant about this and I understand that but this does not negate the fact that it will lower the risk of blindness.  She is very afraid to go blind because she has already had that once before.  She tells me that she feels like she has been losing vision but has not told anybody about it because she is concerned that her children will get upset.  I am sending her to the emergency room and she is going to think about what she wants to do in the end

## 2024-06-03 NOTE — Procedures (Signed)
 PROCEDURE SUMMARY:  Successful fluoroscopic guided lumbar puncture.  Opening pressure was 24 cm H2O ~20 mL clear colorless CSF fluid collected. Closing pressure was 11 cm H2O  No immediate complications.  Pt tolerated well.   EBL = trace  Lay flat for 1 hour post procedure.  Please see full dictation in imaging section of Epic for procedure details.  Electronically signed by: Amina Menchaca, PA-C

## 2024-06-03 NOTE — Discharge Instructions (Addendum)
 Please follow-up closely with neurosurgery and return to ER with new or worsening symptoms.

## 2024-06-03 NOTE — ED Notes (Signed)
 Pt gives verbal consent for mse

## 2024-06-03 NOTE — ED Provider Triage Note (Signed)
 Emergency Medicine Provider Triage Evaluation Note  Helen Wells , a 38 y.o. female  was evaluated in triage.  Pt complains of headache, blurred vision, brain fog, and eye pressure secondary to idiopathic intracranial hypertension.  Patient was diagnosed with IIH when she was 38 years old.  She has had lumbar punctures to relieve her symptoms in the past; her last lumbar puncture was on November 7.  She reports immediately relief of eye pressure, however the eye pressure returned 3 days ago.  She never experienced relief of her headache, blurred vision, or brain fog after this recent lumbar puncture.  Patient reports blurred vision has worsened with time.  Patient was seen by her neurosurgeon earlier today.  They attempted to call the office where she typically has her lumbar punctures done however when they did not answer the phone her neurosurgeon sent her here for lumbar puncture procedure.  Review of Systems  Positive: Blurred vision.  Headache.  Bilateral eye pressure.  Brain fog. Negative: Nausea, vomiting, dizziness, syncope.  Physical Exam  BP (!) 131/92   Pulse 84   Temp 98.3 F (36.8 C) (Oral)   Resp 18   LMP 05/02/2024 (Approximate)   SpO2 100%  Gen:   Awake, no distress   Resp:  Normal effort  MSK:   Moves extremities without difficulty  Other:  Pupils are equal round and reactive to light.  Peripheral field of vision is intact.  Strength of bilateral upper extremities is intact.  Medical Decision Making  Medically screening exam initiated at 3:19 PM.  Appropriate orders placed.  Berenice A Buckhalter was informed that the remainder of the evaluation will be completed by another provider, this initial triage assessment does not replace that evaluation, and the importance of remaining in the ED until their evaluation is complete.   Rosina Almarie DELENA, PA-C 06/03/24 1525

## 2024-06-03 NOTE — ED Provider Notes (Signed)
 [place] EMERGENCY DEPARTMENT AT [facility] Provider Note   CSN: [id number] Arrival date & time: 06/03/24  1436     Patient presents with: Headache   Helen Wells is a 38 y.o. female with past medical history reporting to ER with complaint of IIH, HTN, GAD reporting to Er with complaint of blurry vision, headache, pressure behind eyes.  Patient reports that this feels like her typical increased pressure when she needs to have lumbar puncture.  She was sent from her neurosurgeon for stat LP.  She has no fever, neck stiffness.  No injury trauma or fall.  No focal neurological deficit.    Stat LP with IR ordered per Neurosurgery note for therapeutic LP.    Headache      Prior to Admission medications   Medication Sig Start Date End Date Taking? Authorizing Provider  acetaminophen  (TYLENOL ) 500 MG tablet Take 1,000 mg by mouth every 6 (six) hours as needed (pain.).    [provider]  acetaZOLAMIDE  (DIAMOX ) 250 MG tablet Take 2 tablets (500 mg total) by mouth 2 (two) times daily. 01/09/23   Onita Duos, MD  amitriptyline  (ELAVIL ) 25 MG tablet Take 1 tablet (25 mg total) by mouth at bedtime. 03/13/24   Frann Mabel Mt, DO  b complex vitamins capsule Take 1 capsule by mouth daily.    [provider]  celecoxib (CELEBREX) 100 MG capsule Take 1 capsule (100 mg total) by mouth 2 (two) times daily. Patient not taking: Reported on 06/03/2024 05/19/24   Carilyn Prentice BRAVO, MD  cyanocobalamin  (VITAMIN B12) 500 MCG tablet Take 500 mcg by mouth daily.    [provider]  esomeprazole (NEXIUM) 20 MG capsule Take 1 capsule (20 mg total) by mouth daily at 12 noon. Patient not taking: Reported on 06/03/2024 05/19/24   Carilyn Prentice BRAVO, MD  ferrous sulfate 324 MG TBEC Take 324 mg by mouth.    [provider]  folic acid  (FOLVITE ) 1 MG tablet Take 1 mg by mouth daily.    [provider]  lisinopril  (ZESTRIL ) 20 MG tablet Take 1  tablet (20 mg total) by mouth daily. 01/10/24   Frann Mabel Mt, DO  rizatriptan  (MAXALT -MLT) 10 MG disintegrating tablet Take 1 tablet (10 mg total) by mouth as needed for migraine. May repeat in 2 hours if needed.  Maximum 2 tablets in 24 hours. 05/29/24   Skeet Juliene SAUNDERS, DO    Allergies: Bupivacaine -meloxicam er, Erythromycin, Mobic [meloxicam], Tetracyclines & related, Erythromycin base, Gluten meal, and Macrolides and ketolides    Review of Systems  Neurological:  Positive for headaches.    Updated Vital Signs BP (!) 131/92   Pulse 84   Temp 98.3 F (36.8 C) (Oral)   Resp 18   LMP 05/02/2024 (Approximate)   SpO2 100%   Physical Exam Vitals and nursing note reviewed.  Constitutional:      General: She is not in acute distress.    Appearance: She is not toxic-appearing.  HENT:     Head: Normocephalic and atraumatic.  Eyes:     General: No scleral icterus.    Conjunctiva/sclera: Conjunctivae normal.  Cardiovascular:     Rate and Rhythm: Normal rate and regular rhythm.     Pulses: Normal pulses.     Heart sounds: Normal heart sounds.  Pulmonary:     Effort: Pulmonary effort is normal. No respiratory distress.     Breath sounds: Normal breath sounds.  Abdominal:     General: Abdomen  is flat. Bowel sounds are normal.     Palpations: Abdomen is soft.     Tenderness: There is no abdominal tenderness.  Skin:    General: Skin is warm and dry.     Findings: No lesion.  Neurological:     General: No focal deficit present.     Mental Status: She is alert and oriented to person, place, and time. Mental status is at baseline.     GCS: GCS eye subscore is 4. GCS verbal subscore is 5. GCS motor subscore is 6.     (all labs ordered are listed, but only abnormal results are displayed) Labs Reviewed  HCG, SERUM, QUALITATIVE  CBC WITH DIFFERENTIAL/PLATELET  COMPREHENSIVE METABOLIC PANEL WITH GFR    EKG: None  Radiology: No results found.   Procedures    Medications Ordered in the ED  prochlorperazine (COMPAZINE) injection 10 mg (has no administration in time range)  diphenhydrAMINE (BENADRYL) injection 12.5 mg (has no administration in time range)  dexamethasone  (DECADRON ) injection 10 mg (has no administration in time range)    Clinical Course as of 06/03/24 1726  Tue Jun 03, 2024  1619 Spoke with Neurosurgery Dr Rosslyn will call back after LP [JB]    Clinical Course User Index [JB] Odeal Welden, Warren SAILOR, PA-C                                 Medical Decision Making Amount and/or Complexity of Data Reviewed Labs: ordered. Radiology: ordered.   This patient presents to the ED for concern of headache, this involves an extensive number of treatment options, and is a complaint that carries with it a high risk of complications and morbidity.  The differential diagnosis includes tension headache, migraine, intracranial mass, intracranial hemorrhage, intracranial infection including meningitis vs encephalitis, trigeminal neuralgia, AVM, sinusitis, cerebral aneurysm, muscular headache, cavernous sinus thrombosis, carotid artery dissection.   Lab Tests:  I personally interpreted labs.  The pertinent results include:   CBC, CMP and hCG unremarkable    Consultations Obtained:  I requested consultation with the neurosurgery Dr Rosslyn  and discussed lab and imaging findings as well as pertinent plan - they recommend: Lp, likely okay to d/c if no improvement Patient has been seen by neurosurgery here and they have voiced follow-up instructions.   Problem List / ED Course / Critical interventions / Medication management  Patient reports with headache, pressure behind eye and blurry vision.  She has no focal neurological deficit on exam.  Hemodynamically stable well-appearing. No fever or meningeal signs. She has not been having fever and she denies injury trauma or fall.  Basic labs are reassuring.  I did consult IR who performed lumbar  puncture.  They recommended 1 hour of laying flat before reassessment and ultimately discharge. Reviewed patient's office visit with neurosurgery earlier today.  She had extensive conversation about IIH treatment.  Ultimately was sent here for stat therapeutic lumbar puncture. Patient had an LP here with significant improvement in symptoms.  She declines any further treatment here.  She tolerated LP well and was monitored for over an hour in ED after LP.  She was also seen by neurosurgery. I have reviewed the patients home medicines and have made adjustments as needed. Given improvement in symptoms and neurosurgery is assessed at bedside feel stable for discharge.  Hemodynamically stable throughout stay.  Appropriate for discharge with close outpatient follow-up.  She was given return precautions.  Final diagnoses:  Bad headache    ED Discharge Orders     None          Shermon Warren SAILOR, PA-C 06/03/24 1733    Franklyn Sid SAILOR, MD 06/04/24 [phone]

## 2024-06-03 NOTE — ED Triage Notes (Signed)
 Pt ambulatory to er, pt states that she has a hx of IIH, states that she is here for a headache. Pt states that she is feeling dizzy, blurry vision and brain fog.  States that this is what her normal headaches feel like.  No facial droop noted, denies unilateral weakness

## 2024-06-03 NOTE — Telephone Encounter (Signed)
 Patient called from ED stating that the ER won't be able to do her LP this evening. I talked with the patient's primary nurse via secure chat who stated they are working with IR to get the patient taken care of.  Patient wanted me to reach out to [facility]. I made their scheduler , Wilbern Dills, aware she may need outpatient LP but that I would touch base if this does infact need to be scheduled.

## 2024-06-04 ENCOUNTER — Encounter: Payer: Self-pay | Admitting: Neurosurgery

## 2024-06-04 NOTE — Telephone Encounter (Signed)
 Patient scheduled to discuss surgery.

## 2024-06-06 ENCOUNTER — Other Ambulatory Visit: Payer: Self-pay

## 2024-06-06 ENCOUNTER — Encounter: Payer: Self-pay | Admitting: Neurosurgery

## 2024-06-06 ENCOUNTER — Ambulatory Visit: Admitting: Neurosurgery

## 2024-06-06 ENCOUNTER — Ambulatory Visit: Payer: Self-pay | Admitting: General Surgery

## 2024-06-06 ENCOUNTER — Encounter (HOSPITAL_COMMUNITY): Payer: Self-pay | Admitting: Neurosurgery

## 2024-06-06 VITALS — BP 139/87 | HR 71 | Temp 98.3°F | Ht 63.0 in | Wt 219.8 lb

## 2024-06-06 DIAGNOSIS — G932 Benign intracranial hypertension: Secondary | ICD-10-CM

## 2024-06-06 NOTE — Progress Notes (Signed)
 SDW call  Patient was given pre-op instructions over the phone. Patient verbalized understanding of instructions provided. She denies SOB, fever or cough    PCP - Dr. Mabel Pry Cardiologist -  Pulmonary:    PPM/ICD - denies Device Orders - na Rep Notified - na   Chest x-ray - 01/17/2024 EKG -  07/23/2023 Stress Test - ECHO -  Cardiac Cath -  PFT: 12/05/2023  Sleep Study/sleep apnea/CPAP: States was suppose to go for testing but never went  Non-diabetic  Blood Thinner Instructions: denies Aspirin Instructions:denies   ERAS Protcol - NPO   Anesthesia review: Yes. Intracranial HTN, HTN, HLD,   Your procedure is scheduled on Monday June 09, 2024  Report to [facility] Main Entrance A at  0940  A.M., then check in with the Admitting office.  Call this number if you have problems the morning of surgery:  [phone]   If you have any questions prior to your surgery date call [phone]: Open Monday-Friday 8am-4pm If you experience any cold or flu symptoms such as cough, fever, chills, shortness of breath, etc. between now and your scheduled surgery, please notify us  at the above number    Remember:  Do not eat or drink after midnight the night before your surgery  Take these medicines the morning of surgery with A SIP OF WATER :  Diamox   As needed: Tylenol , maxalt   As of today, STOP taking any Aspirin (unless otherwise instructed by your surgeon) Aleve, Naproxen, Ibuprofen, Motrin, Advil, Goody's, BC's, all herbal medications, fish oil, and all vitamins.

## 2024-06-06 NOTE — Progress Notes (Signed)
 Anesthesia Chart Review: CANDELARIA MICHAE POLITE  Case: [id number] Date/Time: 06/09/24 1155   Procedures:      SHUNT INSERTION VENTRICULAR-PERITONEAL     COMPUTER-ASSISTED NAVIGATION, FOR CRANIAL PROCEDURE     LAPAROSCOPY, DIAGNOSTIC   Anesthesia type: General   Pre-op diagnosis: Blurred vision.  Headache.  Bilateral eye pressure.  Brain fog.   Location: [facility] ROOM 20 / [facility] OR   Surgeons: Rosslyn Dino HERO, MD; Polly Cordella LABOR, MD       DISCUSSION: Patient is a 38 year old female scheduled for the above procedure. Per 06/06/2024 Progress Note by Dr. Rosslyn, she has idiopathic intracranial hypertension since the age of 9. She has had worsening of her condition and as opposed to previous lumbar punctures, she did not have any relief at the last 1. She was in my clinic last week very upset because she was losing vision. We emergently sent her to the emergency room and a lumbar puncture demonstrated opening pressure of 24 cm and CSF was taken off... She reported relief for only a few hours. He offered another LP, but she preferred to proceed with surgery which he would plan to scheduled within the next few day--and scheduled for 06/09/2024.    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   She is a same day work-up, so anesthesia team to evaluate on the day of surgery.  Labs on 06/03/2024 included CBC with differential, CMP, and negative pregnancy test.   VS: LMP 06/01/2024 (Approximate)  Wt Readings from Last 3 Encounters:  06/06/24 99.7 kg  06/03/24 99.8 kg  05/27/24 100.6 kg   BP Readings from Last 3 Encounters:  06/06/24  139/87  06/03/24 122/86  06/03/24 131/88   Pulse Readings from Last 3 Encounters:  06/06/24 71  06/03/24 73  06/03/24 78     PROVIDERS: Frann Mabel Mt, DO is PCP  Skeet Cornet, DO is neurologist Alvaro Ricardo Raddle, MD is urologist   LABS: Most recent lab results in [facility] include: Lab Results  Component Value Date   WBC 7.7 06/03/2024   HGB 12.2 06/03/2024   HCT 37.2 06/03/2024   PLT 314 06/03/2024   GLUCOSE 98 06/03/2024   CHOL 204 (H) 05/16/2024   TRIG 151.0 (H) 05/16/2024   HDL 41.30 05/16/2024   LDLCALC 132 (H) 05/16/2024   ALT 20 06/03/2024   AST 17 06/03/2024   NA 139 06/03/2024   K 3.5 06/03/2024   CL 102 06/03/2024   CREATININE 0.98 06/03/2024   BUN 9 06/03/2024   CO2 24 06/03/2024   TSH 3.500 11/15/2023   HGBA1C 5.7 03/14/2023     PFTs 02/21/2024:  Latest Reference Range & Units 02/21/24 14:51  FVC-Pre L 3.68  FVC-%Pred-Pre % 102  FEV1-Pre L 3.25  FEV1-%Pred-Pre % 109  Pre FEV1/FVC ratio % 88  FEV1FVC-%Pred-Pre % 106  FEF 25-75 Pre L/sec 4.68  FEF2575-%Pred-Pre % 148  FEV6-Pre L 3.68  FEV6-%Pred-Pre % 104  Pre FEV6/FVC Ratio % 100  FEV6FVC-%Pred-Pre % 101  TLC L 5.31  TLC % pred % 108  RV L 1.54  RV % pred % 102  DLCO unc ml/min/mmHg 26.91  DLCO unc % pred % 126  DL/VA ml/min/mmHg/L 4.35  DL/VA % pred % 874    IMAGES: MRV Head  05/29/2024: IMPRESSION: 1. Negative for dural venous sinus thrombosis.  CT Head 05/02/2024: IMPRESSION: 1. No acute intracranial abnormality. 2. Chronic Partially empty, expanded sella which can be a normal anatomic variant but also is associated with idiopathic intracranial hypertension (pseudotumor cerebri).   CXR 01/17/2024: FINDINGS: No focal airspace consolidation, pleural effusion, or pneumothorax. No cardiomegaly.No acute fracture or destructive lesion. IMPRESSION: No acute cardiopulmonary abnormality.     EKG: 07/23/2023: NSR   CV: N/A  Past Medical History:  Diagnosis Date   ADHD  (attention deficit hyperactivity disorder)    Anemia    Anemia    Anxiety    Clotting disorder    Headache    Hyperlipemia    Hypertension     Past Surgical History:  Procedure Laterality Date   Gallbladder Removal 2017     lancinectomy and Discectomy 2021     Dr Sherryl - Neurosurgeon   ROBOTIC ASSITED PARTIAL NEPHRECTOMY Left 08/01/2023   Procedure: XI ROBOTIC ASSITED LEFT PARTIAL NEPHRECTOMY WITH ULTRASOUND;  Surgeon: Alvaro Ricardo KATHEE Mickey., MD;  Location: [facility] ORS;  Service: Urology;  Laterality: Left;  180 MINUTES    MEDICATIONS: No current facility-administered medications for this encounter.    acetaminophen  (MIDOL) 650 MG CR tablet   acetaZOLAMIDE  (DIAMOX ) 250 MG tablet   amitriptyline  (ELAVIL ) 25 MG tablet   b complex vitamins capsule   cyanocobalamin  (VITAMIN B12) 500 MCG tablet   ferrous sulfate 324 MG TBEC   folic acid  (FOLVITE ) 400 MCG tablet   lisinopril  (ZESTRIL ) 20 MG tablet   rizatriptan  (MAXALT -MLT) 10 MG disintegrating tablet    Isaiah Ruder, PA-C Surgical Short Stay/Anesthesiology [facility] Phone [phone] [facility] Phone [phone] 06/06/2024 1:54 PM

## 2024-06-06 NOTE — Addendum Note (Signed)
 Addended by: Aleatha Taite on: 06/06/2024 12:05 PM   Modules accepted: Orders

## 2024-06-06 NOTE — Progress Notes (Signed)
 38 year old lady with idiopathic intracranial hypertension since the age of 86.  She has had worsening of her condition and as opposed to previous lumbar punctures, she did not have any relief at the last 1.  She was in my clinic last week very upset because she was losing vision.  We emergently sent her to the emergency room and a lumbar puncture demonstrated opening pressure of 24 cm and CSF was taken off.  She returns today and has a list of questions that have been noted in her chart.  I went over all these questions again with her.  Previously she wanted to have a venous stent placed but has decided that she wants to have a shunt.  I explained to her what the procedure entails and the risk of infection and hemorrhage requiring additional surgery.  We talked about the possibility of abdominal discomfort and need for repeat surgery and/or conversion to a ventriculoatrial shunt.  I told her that her ventricular atrial shunts and my block are a salvage procedure due to the need of aspirin and sometimes even Eliquis.  I told her that my aim would be is to reduce her risk of visual decline as our main reason to do surgery.  If she gets headache reduction this would be a positive and my hope would be to bring her headaches down to a 3 out of 10.  She says that after the lumbar puncture she had relief for a couple of hours but again has blurry vision and feels like it is getting worse.  I offered her another lumbar puncture but she does not want to do that.  She wants to proceed with surgery.  I will see if I can get this done within a few days.

## 2024-06-06 NOTE — Anesthesia Preprocedure Evaluation (Signed)
 Anesthesia Evaluation  Patient identified by MRN, date of birth, ID band Patient awake    Reviewed: Allergy & Precautions, H&P , NPO status , Patient's Chart, lab work & pertinent test results  History of Anesthesia Complications (+) POST - OP SPINAL HEADACHE and history of anesthetic complications  Airway Mallampati: II  TM Distance: >3 FB Neck ROM: Full    Dental no notable dental hx. (+) Teeth Intact, Dental Advisory Given   Pulmonary neg pulmonary ROS   Pulmonary exam normal breath sounds clear to auscultation       Cardiovascular Exercise Tolerance: Good hypertension, Pt. on medications  Rhythm:Regular Rate:Normal     Neuro/Psych  Headaches  Anxiety        GI/Hepatic negative GI ROS, Neg liver ROS,,,  Endo/Other    Class 3 obesity  Renal/GU negative Renal ROS  negative genitourinary   Musculoskeletal   Abdominal   Peds  Hematology  (+) Blood dyscrasia, anemia   Anesthesia Other Findings   Reproductive/Obstetrics negative OB ROS                              Anesthesia Physical Anesthesia Plan  ASA: 3  Anesthesia Plan: General   Post-op Pain Management: Tylenol  PO (pre-op)*   Induction: Intravenous  PONV Risk Score and Plan: 4 or greater and Ondansetron , Dexamethasone  and Midazolam   Airway Management Planned: Oral ETT  Additional Equipment:   Intra-op Plan:   Post-operative Plan: Extubation in OR  Informed Consent: I have reviewed the patients History and Physical, chart, labs and discussed the procedure including the risks, benefits and alternatives for the proposed anesthesia with the patient or authorized representative who has indicated his/her understanding and acceptance.     Dental advisory given  Plan Discussed with: CRNA  Anesthesia Plan Comments: (PAT note written 06/06/2024 by Ervey Fallin, PA-C.  )         Anesthesia Quick Evaluation

## 2024-06-07 ENCOUNTER — Ambulatory Visit (HOSPITAL_COMMUNITY)
Admission: RE | Admit: 2024-06-07 | Discharge: 2024-06-07 | Disposition: A | Discharge status: Home / Self Care | Source: Ambulatory Visit | Attending: Neurosurgery | Admitting: Neurosurgery

## 2024-06-07 DIAGNOSIS — G932 Benign intracranial hypertension: Secondary | ICD-10-CM | POA: Diagnosis not present

## 2024-06-09 ENCOUNTER — Encounter (HOSPITAL_COMMUNITY)
Admission: RE | Disposition: A | Discharge status: Home / Self Care | Payer: Self-pay | Source: Home / Self Care | Attending: Neurosurgery

## 2024-06-09 ENCOUNTER — Encounter (HOSPITAL_COMMUNITY): Payer: Self-pay | Admitting: Neurosurgery

## 2024-06-09 ENCOUNTER — Encounter: Payer: Self-pay | Admitting: Neurosurgery

## 2024-06-09 ENCOUNTER — Ambulatory Visit (HOSPITAL_COMMUNITY): Payer: Self-pay | Admitting: Vascular Surgery

## 2024-06-09 ENCOUNTER — Observation Stay (HOSPITAL_COMMUNITY)

## 2024-06-09 ENCOUNTER — Institutional Professional Consult (permissible substitution): Admitting: Family Medicine

## 2024-06-09 ENCOUNTER — Observation Stay (HOSPITAL_COMMUNITY)
Admission: RE | Admit: 2024-06-09 | Discharge: 2024-06-10 | Disposition: A | Discharge status: Home / Self Care | Attending: Neurosurgery | Admitting: Neurosurgery

## 2024-06-09 DIAGNOSIS — G932 Benign intracranial hypertension: Principal | ICD-10-CM | POA: Insufficient documentation

## 2024-06-09 DIAGNOSIS — Z79899 Other long term (current) drug therapy: Secondary | ICD-10-CM | POA: Insufficient documentation

## 2024-06-09 DIAGNOSIS — Z982 Presence of cerebrospinal fluid drainage device: Secondary | ICD-10-CM | POA: Diagnosis not present

## 2024-06-09 DIAGNOSIS — I10 Essential (primary) hypertension: Secondary | ICD-10-CM

## 2024-06-09 DIAGNOSIS — F411 Generalized anxiety disorder: Secondary | ICD-10-CM | POA: Diagnosis not present

## 2024-06-09 DIAGNOSIS — D509 Iron deficiency anemia, unspecified: Secondary | ICD-10-CM | POA: Diagnosis not present

## 2024-06-09 DIAGNOSIS — Z4541 Encounter for adjustment and management of cerebrospinal fluid drainage device: Secondary | ICD-10-CM | POA: Diagnosis not present

## 2024-06-09 HISTORY — PX: VENTRICULOPERITONEAL SHUNT: SHX204

## 2024-06-09 HISTORY — PX: LAPAROSCOPY: SHX197

## 2024-06-09 HISTORY — PX: APPLICATION OF CRANIAL NAVIGATION: SHX6578

## 2024-06-09 LAB — SURGICAL PCR SCREEN
MRSA, PCR: POSITIVE — AB
Staphylococcus aureus: POSITIVE — AB

## 2024-06-09 LAB — POCT PREGNANCY, URINE: Preg Test, Ur: NEGATIVE

## 2024-06-09 MED ORDER — LACTATED RINGERS IV SOLN: INTRAVENOUS | Status: DC | PRN

## 2024-06-09 MED ORDER — ONDANSETRON HCL 4 MG/2ML IJ SOLN: 4.0000 mg | INTRAMUSCULAR | Status: DC | PRN

## 2024-06-09 MED ORDER — CEFAZOLIN SODIUM-DEXTROSE 2-4 GM/100ML-% IV SOLN
2.0000 g | Freq: Once | INTRAVENOUS | Status: AC
  Administered 2024-06-09: 2 g via INTRAVENOUS

## 2024-06-09 MED ORDER — ACETAMINOPHEN 650 MG RE SUPP: 650.0000 mg | RECTAL | Status: DC | PRN

## 2024-06-09 MED ORDER — ACETAZOLAMIDE 250 MG PO TABS
500.0000 mg | ORAL_TABLET | Freq: Two times a day (BID) | ORAL | Status: DC
  Administered 2024-06-09 – 2024-06-10 (×2): 500 mg via ORAL
  Filled 2024-06-09 (×3): qty 2

## 2024-06-09 MED ORDER — ONDANSETRON HCL 4 MG/2ML IJ SOLN
INTRAMUSCULAR | Status: DC | PRN
  Administered 2024-06-09: 4 mg via INTRAVENOUS

## 2024-06-09 MED ORDER — PROPOFOL 10 MG/ML IV BOLUS
INTRAVENOUS | Status: DC | PRN
  Administered 2024-06-09: 200 mg via INTRAVENOUS

## 2024-06-09 MED ORDER — ONDANSETRON HCL 4 MG PO TABS: 4.0000 mg | ORAL_TABLET | ORAL | Status: DC | PRN

## 2024-06-09 MED ORDER — CHLORHEXIDINE GLUCONATE CLOTH 2 % EX PADS: 6.0000 | MEDICATED_PAD | Freq: Every day | CUTANEOUS | Status: DC

## 2024-06-09 MED ORDER — ACETAMINOPHEN 325 MG PO TABS: 650.0000 mg | ORAL_TABLET | ORAL | Status: DC | PRN

## 2024-06-09 MED ORDER — FENTANYL CITRATE (PF) 50 MCG/ML IJ SOSY
25.0000 ug | PREFILLED_SYRINGE | INTRAMUSCULAR | Status: DC | PRN
  Administered 2024-06-10: 25 ug via INTRAVENOUS
  Filled 2024-06-09: qty 1

## 2024-06-09 MED ORDER — FENTANYL CITRATE (PF) 250 MCG/5ML IJ SOLN
INTRAMUSCULAR | Status: DC | PRN
  Administered 2024-06-09: 100 ug via INTRAVENOUS

## 2024-06-09 MED ORDER — SURGIFLO WITH THROMBIN (HEMOSTATIC MATRIX KIT) OPTIME
TOPICAL | Status: DC | PRN
  Administered 2024-06-09: 1 via TOPICAL

## 2024-06-09 MED ORDER — ROCURONIUM BROMIDE 10 MG/ML (PF) SYRINGE
PREFILLED_SYRINGE | INTRAVENOUS | Status: AC
  Filled 2024-06-09: qty 10

## 2024-06-09 MED ORDER — DOCUSATE SODIUM 100 MG PO CAPS
100.0000 mg | ORAL_CAPSULE | Freq: Two times a day (BID) | ORAL | Status: DC
  Administered 2024-06-09 – 2024-06-10 (×2): 100 mg via ORAL
  Filled 2024-06-09 (×2): qty 1

## 2024-06-09 MED ORDER — BACITRACIN ZINC 500 UNIT/GM EX OINT
TOPICAL_OINTMENT | CUTANEOUS | Status: AC
  Filled 2024-06-09: qty 28.35

## 2024-06-09 MED ORDER — CHLORHEXIDINE GLUCONATE 0.12 % MT SOLN: 15.0000 mL | Freq: Once | OROMUCOSAL | Status: AC

## 2024-06-09 MED ORDER — LISINOPRIL 20 MG PO TABS
20.0000 mg | ORAL_TABLET | Freq: Every day | ORAL | Status: DC
  Administered 2024-06-09: 20 mg via ORAL
  Filled 2024-06-09 (×2): qty 1

## 2024-06-09 MED ORDER — ORAL CARE MOUTH RINSE: 15.0000 mL | Freq: Once | OROMUCOSAL | Status: AC

## 2024-06-09 MED ORDER — VANCOMYCIN HCL 1000 MG IV SOLR
INTRAVENOUS | Status: DC | PRN
  Administered 2024-06-09: 1000 mg

## 2024-06-09 MED ORDER — ACETAMINOPHEN 500 MG PO TABS: 1000.0000 mg | ORAL_TABLET | Freq: Once | ORAL | Status: AC

## 2024-06-09 MED ORDER — HYDROMORPHONE HCL 1 MG/ML IJ SOLN
0.2500 mg | INTRAMUSCULAR | Status: DC | PRN
  Administered 2024-06-09 (×2): 0.25 mg via INTRAVENOUS

## 2024-06-09 MED ORDER — PROPOFOL 500 MG/50ML IV EMUL
INTRAVENOUS | Status: DC | PRN
  Administered 2024-06-09: 150 ug/kg/min via INTRAVENOUS

## 2024-06-09 MED ORDER — PROMETHAZINE HCL 12.5 MG PO TABS: 12.5000 mg | ORAL_TABLET | ORAL | Status: DC | PRN

## 2024-06-09 MED ORDER — BUTALBITAL-APAP-CAFFEINE 50-325-40 MG PO TABS
2.0000 | ORAL_TABLET | ORAL | Status: DC | PRN
  Administered 2024-06-09 – 2024-06-10 (×3): 2 via ORAL
  Filled 2024-06-09 (×3): qty 2

## 2024-06-09 MED ORDER — VANCOMYCIN HCL 1.5 G IV SOLR: 1500.0000 mg | Freq: Two times a day (BID) | INTRAVENOUS | Status: DC

## 2024-06-09 MED ORDER — MUPIROCIN 2 % EX OINT
1.0000 | TOPICAL_OINTMENT | Freq: Two times a day (BID) | CUTANEOUS | Status: DC
  Administered 2024-06-09 – 2024-06-10 (×2): 1 via NASAL
  Filled 2024-06-09: qty 22

## 2024-06-09 MED ORDER — LIDOCAINE-EPINEPHRINE 1 %-1:100000 IJ SOLN
INTRAMUSCULAR | Status: AC
  Filled 2024-06-09: qty 1

## 2024-06-09 MED ORDER — LIDOCAINE 2% (20 MG/ML) 5 ML SYRINGE
INTRAMUSCULAR | Status: AC
  Filled 2024-06-09: qty 5

## 2024-06-09 MED ORDER — VANCOMYCIN HCL 1000 MG IV SOLR
INTRAVENOUS | Status: AC
  Filled 2024-06-09: qty 20

## 2024-06-09 MED ORDER — ROCURONIUM BROMIDE 10 MG/ML (PF) SYRINGE
PREFILLED_SYRINGE | INTRAVENOUS | Status: DC | PRN
  Administered 2024-06-09: 50 mg via INTRAVENOUS

## 2024-06-09 MED ORDER — FENTANYL CITRATE (PF) 100 MCG/2ML IJ SOLN
INTRAMUSCULAR | Status: AC
  Filled 2024-06-09: qty 2

## 2024-06-09 MED ORDER — ONDANSETRON HCL 4 MG/2ML IJ SOLN
INTRAMUSCULAR | Status: AC
  Filled 2024-06-09: qty 2

## 2024-06-09 MED ORDER — PHENYLEPHRINE 80 MCG/ML (10ML) SYRINGE FOR IV PUSH (FOR BLOOD PRESSURE SUPPORT)
PREFILLED_SYRINGE | INTRAVENOUS | Status: AC
  Filled 2024-06-09: qty 10

## 2024-06-09 MED ORDER — CEFAZOLIN SODIUM-DEXTROSE 2-4 GM/100ML-% IV SOLN
INTRAVENOUS | Status: AC
  Filled 2024-06-09: qty 100

## 2024-06-09 MED ORDER — DEXAMETHASONE SOD PHOSPHATE PF 10 MG/ML IJ SOLN
INTRAMUSCULAR | Status: DC | PRN
  Administered 2024-06-09: 10 mg via INTRAVENOUS

## 2024-06-09 MED ORDER — PROPOFOL 10 MG/ML IV BOLUS
INTRAVENOUS | Status: AC
  Filled 2024-06-09: qty 20

## 2024-06-09 MED ORDER — VANCOMYCIN HCL IN DEXTROSE 1-5 GM/200ML-% IV SOLN
1000.0000 mg | Freq: Once | INTRAVENOUS | Status: AC
  Administered 2024-06-09: 1000 mg via INTRAVENOUS

## 2024-06-09 MED ORDER — VANCOMYCIN HCL IN DEXTROSE 1-5 GM/200ML-% IV SOLN
1000.0000 mg | Freq: Two times a day (BID) | INTRAVENOUS | Status: AC
Start: 2024-06-09 — End: 2024-06-10
  Administered 2024-06-09 – 2024-06-10 (×2): 1000 mg via INTRAVENOUS
  Filled 2024-06-09 (×2): qty 200

## 2024-06-09 MED ORDER — AMITRIPTYLINE HCL 25 MG PO TABS
25.0000 mg | ORAL_TABLET | Freq: Every day | ORAL | Status: DC
  Administered 2024-06-09: 25 mg via ORAL
  Filled 2024-06-09: qty 1

## 2024-06-09 MED ORDER — PHENYLEPHRINE HCL-NACL 20-0.9 MG/250ML-% IV SOLN
INTRAVENOUS | Status: DC | PRN
  Administered 2024-06-09: 20 ug/min via INTRAVENOUS

## 2024-06-09 MED ORDER — ACETAMINOPHEN 500 MG PO TABS
ORAL_TABLET | ORAL | Status: AC
  Administered 2024-06-09: 1000 mg via ORAL
  Filled 2024-06-09: qty 2

## 2024-06-09 MED ORDER — BACITRACIN ZINC 500 UNIT/GM EX OINT
TOPICAL_OINTMENT | CUTANEOUS | Status: DC | PRN
  Administered 2024-06-09: 1 via TOPICAL

## 2024-06-09 MED ORDER — LIDOCAINE-EPINEPHRINE 1 %-1:100000 IJ SOLN
INTRAMUSCULAR | Status: DC | PRN
  Administered 2024-06-09: 8 mL
  Administered 2024-06-09: 10 mL

## 2024-06-09 MED ORDER — BACLOFEN 10 MG PO TABS
10.0000 mg | ORAL_TABLET | Freq: Two times a day (BID) | ORAL | Status: DC
  Administered 2024-06-09 – 2024-06-10 (×2): 10 mg via ORAL
  Filled 2024-06-09 (×2): qty 1

## 2024-06-09 MED ORDER — VANCOMYCIN HCL IN DEXTROSE 1-5 GM/200ML-% IV SOLN
INTRAVENOUS | Status: AC
  Filled 2024-06-09: qty 200

## 2024-06-09 MED ORDER — 0.9 % SODIUM CHLORIDE (POUR BTL) OPTIME
TOPICAL | Status: DC | PRN
  Administered 2024-06-09: 1000 mL

## 2024-06-09 MED ORDER — CHLORHEXIDINE GLUCONATE 0.12 % MT SOLN
OROMUCOSAL | Status: AC
  Administered 2024-06-09: 15 mL via OROMUCOSAL
  Filled 2024-06-09: qty 15

## 2024-06-09 MED ORDER — LIDOCAINE 2% (20 MG/ML) 5 ML SYRINGE
INTRAMUSCULAR | Status: DC | PRN
  Administered 2024-06-09: 60 mg via INTRAVENOUS

## 2024-06-09 MED ORDER — HYDROMORPHONE HCL 1 MG/ML IJ SOLN
INTRAMUSCULAR | Status: AC
  Filled 2024-06-09: qty 1

## 2024-06-09 MED ORDER — PHENYLEPHRINE 80 MCG/ML (10ML) SYRINGE FOR IV PUSH (FOR BLOOD PRESSURE SUPPORT)
PREFILLED_SYRINGE | INTRAVENOUS | Status: DC | PRN
  Administered 2024-06-09: 80 ug via INTRAVENOUS

## 2024-06-09 MED ORDER — SUGAMMADEX SODIUM 200 MG/2ML IV SOLN
INTRAVENOUS | Status: DC | PRN
  Administered 2024-06-09: 200 mg via INTRAVENOUS

## 2024-06-09 SURGICAL SUPPLY — 2 items
NDL HYPO 22X1.5 SAFETY MO (MISCELLANEOUS) ×1 IMPLANT
NDL INSUFFLATION 14GA 120MM (NEEDLE) ×1 IMPLANT

## 2024-06-09 NOTE — Anesthesia Postprocedure Evaluation (Signed)
 Anesthesia Post Note  Patient: Helen Wells  Procedure(s) Performed: SHUNT INSERTION VENTRICULAR-PERITONEAL COMPUTER-ASSISTED NAVIGATION, FOR CRANIAL PROCEDURE LAPAROSCOPY, DIAGNOSTIC     Patient location during evaluation: PACU Anesthesia Type: General Level of consciousness: awake and alert Pain management: pain level controlled Vital Signs Assessment: post-procedure vital signs reviewed and stable Respiratory status: spontaneous breathing, nonlabored ventilation and respiratory function stable Cardiovascular status: stable and blood pressure returned to baseline Anesthetic complications: no   No notable events documented.  Last Vitals:  Vitals:   06/09/24 1600 06/09/24 1630  BP: 119/79 113/78  Pulse: 72 85  Resp: 15 13  Temp:    SpO2: 99% 100%    Last Pain:  Vitals:   06/09/24 1630  TempSrc:   PainSc: 0-No pain                 Debby FORBES Like

## 2024-06-09 NOTE — Transfer of Care (Signed)
 Immediate Anesthesia Transfer of Care Note  Patient: Helen Wells  Procedure(s) Performed: SHUNT INSERTION VENTRICULAR-PERITONEAL COMPUTER-ASSISTED NAVIGATION, FOR CRANIAL PROCEDURE LAPAROSCOPY, DIAGNOSTIC  Patient Location: PACU  Anesthesia Type:General  Level of Consciousness: awake  Airway & Oxygen Therapy: Patient Spontanous Breathing and Patient connected to face mask oxygen  Post-op Assessment: Report given to RN, Post -op Vital signs reviewed and stable, and Patient moving all extremities  Post vital signs: Reviewed and stable  Last Vitals:  Vitals Value Taken Time  BP 111/75 06/09/24 14:40  Temp    Pulse 74 06/09/24 14:45  Resp 16 06/09/24 14:45  SpO2 96 % 06/09/24 14:45  Vitals shown include unfiled device data.  Last Pain:  Vitals:   06/09/24 1024  TempSrc:   PainSc: 6       Patients Stated Pain Goal: 3 (06/09/24 1024)  Complications: No notable events documented.

## 2024-06-09 NOTE — Addendum Note (Signed)
 Addended by: Napolean Sia on: 06/09/2024 11:48 AM   Modules accepted: Orders

## 2024-06-09 NOTE — Discharge Instructions (Signed)
 Abdominal Dressing and Wound Care  Keep your wound or incision site clean and dry. You have Dermabond/Durabond (skin glue) over your incisions. This will usually remain in place for 10-14 days, then naturally fall off your skin. You may take a shower 24 hrs after surgery, carefully wash, not scrub the incision site with a mild non-scented soap. Pat dry with a soft towel.  Do not pick or peel skin glue off.  You can shower and let the water  fall on the dressings above. Do not soak or submerge your incision(s) in a bath tub, hot tub, or swimming pool, until your doctor says it is ok to do so or the incision(s) have completely healed, usually about 2-4 weeks.  Do not use creams, powder, salves or balms on your incision(s).

## 2024-06-09 NOTE — Op Note (Signed)
 DATE OF SURGERY: 06/09/2024  ATTENDING SURGEON: Dino Sable, MD  CO-SURGEON: Cathlyn Idler, MD  PREOPERATIVE DIAGNOSIS: Idiopathic intracranial hypertension.   POSTOPERATIVE DIAGNOSIS: Idiopathic intracranial hypertension.   PROCEDURE PERFORMED:  Insertion of right frontal ventriculoperitoneal shunt (Hakim) - set at 150 mmHg.  Use of Stealth neuronavigation for guidance to the ventricle.   ANESTHESIA: General endotracheal anesthesia.   ESTIMATED BLOOD LOSS, URINE OUTPUT, AND CRYSTALLOIDS:  See anesthesia chart.   COMPLICATIONS: None.   SPECIMENS: None.   DRAINS: None.   PREOPERATIVE COURSE:  The patient is a 38 year old lady with idiopathic intracranial hypertension, who wanted to pursue ventriculoperitoneal shunt placement. Options were discussed with the patient and family doing nothing versus doing this as well as LP shunting. We had extensive discussions about this procedure and these were documented in the outpatient clinic notes. The risks discussed of surgery included, but not limited to infection, hemorrhage, stroke, paralysis, blindness, speech impairment, coma, seizures, DVT, PE, cardiopulmonary complications, and death amongst others. CSF leak as well as need for reoperation were discussed with them as well. After all their questions were answered to their satisfaction extensively, they requested for us  to proceed.   DESCRIPTION OF PROCEDURE: The patient was brought to the operating room and after general endotracheal anesthesia was ensued, the patient was placed in supine position with the head turned to the left and secured in a [place] head holder. Registration was obtained after which an entry point was chosen and marked. Hereafter, an incision was marked behind the right ear.The operative tract was outlined with sterile 10-10 drapes and the patient was prepped and draped in usual sterile fashion using chlorhexidine  prep x2 and was allowed to dry. Hereafter, an  incision was made behind the right ear and the subgaleal space was identified. Using a shunt passer from proximal to distal, a 30-inch silk catheter was passed and the shunt passer was removed. Using a long forceps after an incision was made at the entry point cranially, this silk could be passed to the cranial incision as well. Once this had been performed, hemostasis was obtained, copious irrigation with saline was performed and using an acorn drill bit, a right frontal bur hole was made and the dura was exposed. Again, with navigation, proper entry point was confirmed and using the Stealth stylet after the dura was opened and cauterized and the pia was cauterized as well, access was gained to the right frontal horn with the ventricular catheter using the Stealth stylet. Hereafter, the previously programmed Hakim Codman valve, which was manipulated only using forceps and had been flushed with bacitracin  saline solution, was passed from proximal to distal and the Rickham reservoir was then connected to the ventricular catheter and secured with a 3-0 silk and the entire system was pulled taut. Egress of CSF was seen at the distal end and handed off to Dr Idler who subsequently placed the tubing in the abdomen. The cranial incisions had been covered with a soaked sponge and the retroauricular incision was closed in separate layers as well as the cranial incision after copious irrigation with bacitracin  saline using 0 vicryl pop-offs and staples on the skin.   At the end of the procedure all counts were complete.  It should be noted that Stereotactic computer-assisted navigation was indicated due to patient's anatomy. Films were reviewed for preop planning. Registration was personally performed by the me. Visual confirmation was done. Navigation was utilized during different portions of the surgery, including on entry to the brain and localization  of the ventricle.

## 2024-06-09 NOTE — Addendum Note (Signed)
 Addended by: Leean Amezcua on: 06/09/2024 02:29 PM   Modules accepted: Orders

## 2024-06-09 NOTE — Op Note (Signed)
 06/09/2024  2:33 PM  PATIENT:  Helen Wells  38 y.o. female  Patient Care Team: Frann Mabel Mt, DO as PCP - General (Family Medicine) Medicine, [facility] Internal (Internal Medicine)  PRE-OPERATIVE DIAGNOSIS:  Intra-cranial hypertension  POST-OPERATIVE DIAGNOSIS:  Same  PROCEDURE:   Diagnostic laparoscopy Insertion of ventriculoperitoneal shunt (abdominal portion)  SURGEONS:   Cordella RONAL Idler, MD Dino Sable, MD  ANESTHESIA:   general  COUNTS:  Sponge, needle and instrument counts were reported correct x2 at the conclusion of the operation.  EBL: Minimal for abdominal portion  DRAINS: VP shunt  SPECIMEN: None  COMPLICATIONS: None  FINDINGS: No intra-abdominal abnormalities noted. Shunt tubing resting at the pelvic brim  DISPOSITION: PACU in satisfactory condition  INDICATION:  [name] is a 38 year old female with intra-cranial hypertension who is followed by Dr. Sable. The decision was made to proceed with VP shunt placement, and I was asked to assist with the abdominal portion. I addressed all of her questions and written consent was obtained.  DESCRIPTION: The patient was identified in preop holding and taken to the OR where she was placed on the operating room table. SCDs were placed. General endotracheal anesthesia was induced without difficulty. Her scalp, neck, chest, and abdomen were then prepped and draped in the usual sterile fashion. A surgical timeout was performed indicating the correct patient, procedure, positioning and need for preoperative antibiotics.   Please see Dr. Catarino documentation for a complete record of his portion of the surgery.  I gained access to the abdomen using a veress needle in the left upper quadrant at palmer's point. Following a positive saline drop test, the insufflation tubing was connected and the abdomen brought to . She tolerated this well. I then placed a 5mm trocar in the left abdomen using an  optiview technique. I introduced a laparoscope. There was no evidence of injury from entry and no significant adhesions or other abnormalities noted. I placed another 5mm port in the left abdomen under direct visualization. At this point. Dr. Sable had tunneled the catheter to the upper abdomen. After we confirmed that there was flow of CSF at the tip of the catheter, it was introduced into the abdomen at the site of the veress needle using a hemostat to penetrate the peritoneum. The catheter was advanced to the pelvis and then trimmed using laparoscopic scissors so that it rested at the pelvic brim.  The trocars were removed. The incisions were closed with 4-0 monocryl and covered with dermabond.

## 2024-06-09 NOTE — Progress Notes (Signed)
 Elavil  - takes 4 tablets at bedtime

## 2024-06-09 NOTE — H&P (Signed)
 38yo lady with IIH   Past Medical History:  Diagnosis Date   ADHD (attention deficit hyperactivity disorder)    Anemia    Anemia    Anxiety    Clotting disorder    Headache    Hyperlipemia    Hypertension    Psh Social History   Socioeconomic History   Marital status: Married    Spouse name: Richard   Number of children: 2   Years of education: Not on file   Highest education level: Bachelor's degree (e.g., BA, AB, BS)  Occupational History   Not on file  Tobacco Use   Smoking status: Never   Smokeless tobacco: Never  Vaping Use   Vaping status: Never Used  Substance and Sexual Activity   Alcohol use: Yes    Alcohol/week: 1.0 standard drink of alcohol    Types: 1 Shots of liquor per week    Comment: 1 drink per month   Drug use: Never   Sexual activity: Yes    Birth control/protection: None  Other Topics Concern   Not on file  Social History Narrative   ** Merged History Encounter **          Right handed 3   Social Drivers of Health   Financial Resource Strain: Not on file  Food Insecurity: No Food Insecurity (08/01/2023)   Hunger Vital Sign    Worried About Running Out of Food in the Last Year: Never true    Ran Out of Food in the Last Year: Never true  Transportation Needs: No Transportation Needs (08/01/2023)   PRAPARE - Administrator, Civil Service (Medical): No    Lack of Transportation (Non-Medical): No  Physical Activity: Not on file  Stress: Stress Concern Present (06/02/2020)   Received from [facility] - Occupational Stress Questionnaire    Feeling of Stress : To some extent  Social Connections: Not on file  Intimate Partner Violence: Not At Risk (08/01/2023)   Humiliation, Afraid, Rape, and Kick questionnaire    Fear of Current or Ex-Partner: No    Emotionally Abused: No    Physically Abused: No    Sexually Abused: No   Family History  Problem Relation Age of Onset   Migraines  Mother    Hypertension Mother    Arthritis Mother    High blood pressure Mother    Stroke Father    Migraines Father    Hypertension Father    Birth defects Father    High Cholesterol Father    Learning disabilities Father    Stroke Sister    Alzheimer's disease Son    Dementia Maternal Grandmother    Parkinsonism Paternal Grandmother    Stroke Paternal Grandfather    Allergies  Allergen Reactions   Bupivacaine -Meloxicam Er     Other reaction(s): Unknown   Erythromycin     Blind Other reaction(s): Other 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 Other reaction(s): Other Informed by her MD not to take any Tetracyclines. Informed by her MD not to take any Tetracyclines. Blind    Erythromycin Base     Other reaction(s): Other (See Comments) Other    Gluten Meal Diarrhea    Acid reflux/knots in stomach  Macrolides And Ketolides     Other reaction(s): Other (See Comments), Other (See Comments) Blindness, Pseudotumor Cerebri  Blindness, Pseudotumor Cerebri     Soy Allergy (Obsolete) Other (See Comments)    Soy free diet   Scheduled Meds: Continuous Infusions:  ceFAZolin      ceFAZolin      PRN Meds:.ceFAZolin  Vitals:   06/09/24 1004  BP: 127/86  Pulse: 88  Resp: 18  Temp: 98.4 F (36.9 C)  SpO2: 100%   Physical Exam HENT:     Head: Normocephalic.     Nose: Nose normal.  Eyes:     Pupils: Pupils are equal, round, and reactive to light.  Cardiovascular:     Rate and Rhythm: Normal rate.  Pulmonary:     Effort: Pulmonary effort is normal.  Abdominal:     General: Abdomen is flat.  Musculoskeletal:     Cervical back: Normal range of motion.  Neurological:     Mental Status: She is alert.   ASSESSMENT: IIH  PLAN: RIGHT VENTRICULOPERITONEAL SHUNT WITH  NAVIGATION

## 2024-06-09 NOTE — Anesthesia Procedure Notes (Signed)
 Procedure Name: Intubation Date/Time: 06/09/2024 1:31 PM  Performed by: Erlene Powell POUR, CRNAPre-anesthesia Checklist: Patient identified, Emergency Drugs available, Suction available and Patient being monitored Patient Re-evaluated:Patient Re-evaluated prior to induction Oxygen Delivery Method: Circle system utilized Preoxygenation: Pre-oxygenation with 100% oxygen Induction Type: IV induction Ventilation: Mask ventilation without difficulty Laryngoscope Size: Miller and 2 Grade View: Grade I Tube type: Oral Number of attempts: 1 Airway Equipment and Method: Stylet Placement Confirmation: ETT inserted through vocal cords under direct vision, positive ETCO2 and breath sounds checked- equal and bilateral Secured at: 20 cm Tube secured with: Tape Dental Injury: Teeth and Oropharynx as per pre-operative assessment

## 2024-06-09 NOTE — H&P (Signed)
 Helen Wells [birth year]  [id number].    HPI:  38 year old female who is a patient of Dr. Janjua. She is being managed for intra-cranial hypertension and is planned to undergo VP shunt placement. I have been asked to assist with the abdominal portion of the operation.  On exam, patient is resting comfortably. NAD. Denies abdominal complains. Previous operations include a partial nephrectomy and cholecystectomy.   ROS: ROS  Family History  Problem Relation Age of Onset   Migraines Mother    Hypertension Mother    Arthritis Mother    High blood pressure Mother    Stroke Father    Migraines Father    Hypertension Father    Birth defects Father    High Cholesterol Father    Learning disabilities Father    Stroke Sister    Alzheimer's disease Son    Dementia Maternal Grandmother    Parkinsonism Paternal Grandmother    Stroke Paternal Grandfather     Past Medical History:  Diagnosis Date   ADHD (attention deficit hyperactivity disorder)    Anemia    Anemia    Anxiety    Clotting disorder    Headache    Hyperlipemia    Hypertension     Past Surgical History:  Procedure Laterality Date   Gallbladder Removal 2017     lancinectomy and Discectomy 2021     Dr Sherryl - Neurosurgeon   ROBOTIC ASSITED PARTIAL NEPHRECTOMY Left 08/01/2023   Procedure: XI ROBOTIC ASSITED LEFT PARTIAL NEPHRECTOMY WITH ULTRASOUND;  Surgeon: Alvaro Ricardo KATHEE Mickey., MD;  Location: [facility];  Service: Urology;  Laterality: Left;  180 MINUTES    Social History:  reports that she has never smoked. She has never used smokeless tobacco. She reports current alcohol use of about 1.0 standard drink of alcohol per week. She reports that she does not use drugs.  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 Other reaction(s): Other Informed by her MD not to take any Tetracyclines. Informed by her MD not to take any Tetracyclines. Blind    Erythromycin Base     Other reaction(s): Other (See Comments) Other    Gluten Meal Diarrhea    Acid reflux/knots in stomach   Macrolides And Ketolides     Other reaction(s): Other (See Comments), Other (See Comments) Blindness, Pseudotumor Cerebri  Blindness, Pseudotumor Cerebri     Soy Allergy (Obsolete) Other (See Comments)    Soy free diet    Medications Prior to Admission  Medication Sig Dispense Refill   acetaminophen  (MIDOL) 650 MG CR tablet Take 650 mg by mouth every 8 (eight) hours as needed for pain.     acetaZOLAMIDE  (DIAMOX ) 250 MG tablet Take 2 tablets (500 mg total) by mouth 2 (two) times daily. 120 tablet 11   amitriptyline  (ELAVIL ) 25 MG tablet Take 1 tablet (25 mg total) by mouth at bedtime. 90 tablet 2   b complex vitamins capsule Take 1 capsule by mouth daily.     cyanocobalamin  (VITAMIN B12) 500 MCG tablet Take 500 mcg by mouth daily.  ferrous sulfate 324 MG TBEC Take 324 mg by mouth.     folic acid  (FOLVITE ) 400 MCG tablet Take 400 mcg by mouth daily.     lisinopril  (ZESTRIL ) 20 MG tablet Take 1 tablet (20 mg total) by mouth daily. 90 tablet 1   rizatriptan  (MAXALT -MLT) 10 MG disintegrating tablet Take 1 tablet (10 mg total) by mouth as needed for migraine. May repeat in 2 hours if needed.  Maximum 2 tablets in 24 hours. 10 tablet 5    Physical Exam: Blood pressure 127/86, pulse 88, temperature 98.4 F (36.9 C), temperature source Oral, resp. rate 18, height 5' 3 (1.6 m), weight 99.8 kg, last menstrual period 05/20/2024, SpO2 100%. Gen: female,  NAD Abd: soft, non-distended, well healed port site in the LUQ, well healed mini laparotomy incision  Results for  orders placed or performed during the hospital encounter of 06/09/24 (from the past 48 hours)  Pregnancy, urine POC     Status: None   Collection Time: 06/09/24 10:38 AM  Result Value Ref Range   Preg Test, Ur NEGATIVE NEGATIVE    Comment:        THE SENSITIVITY OF THIS METHODOLOGY IS >20 mIU/mL.   Surgical pcr screen     Status: Abnormal   Collection Time: 06/09/24 10:46 AM   Specimen: Nasal Mucosa; Nasal Swab  Result Value Ref Range   MRSA, PCR POSITIVE (A) NEGATIVE   Staphylococcus aureus POSITIVE (A) NEGATIVE    Comment: RESULT CALLED TO, READ BACK BY AND VERIFIED WITH: RN G.GLEASON AT 1313 ON 06/09/2024 BY T.SAAD. (NOTE) The Xpert SA Assay (FDA approved for NASAL specimens in patients 57 years of age and older), is one component of a comprehensive surveillance program. It is not intended to diagnose infection nor to guide or monitor treatment. Performed at [facility], [address], [place], [place] [address]    No results found.  Assessment/Plan 38 y/o F planned to undergo a VP shut placement with Dr. Janjua  - Will proceed to the OR. We discussed the alternatives and potential risks of the abdominal portion of the surgery, including but not limited to: bleeding, infection, damage to the bowel or surrounding structures, chronic pain, shunt malfunction, and need for additional procedures. All questions were addressed and consent was obtained.    Cordella DELENA Polly Marlis Cheron Surgery 06/09/2024, 1:19 PM Please see Amion for pager number during day hours 7:00am-4:30pm or 7:00am -11:30am on weekends

## 2024-06-09 NOTE — Progress Notes (Signed)
 Received call from lab that patient tested positive for MRSA PCR. Will notify surgeon.

## 2024-06-10 ENCOUNTER — Ambulatory Visit: Admitting: Neurosurgery

## 2024-06-10 ENCOUNTER — Encounter (HOSPITAL_COMMUNITY): Payer: Self-pay | Admitting: Neurosurgery

## 2024-06-10 ENCOUNTER — Observation Stay (HOSPITAL_COMMUNITY)

## 2024-06-10 ENCOUNTER — Other Ambulatory Visit (HOSPITAL_BASED_OUTPATIENT_CLINIC_OR_DEPARTMENT_OTHER): Payer: Self-pay

## 2024-06-10 DIAGNOSIS — Z982 Presence of cerebrospinal fluid drainage device: Secondary | ICD-10-CM | POA: Diagnosis not present

## 2024-06-10 MED ORDER — BUTALBITAL-APAP-CAFFEINE 50-325-40 MG PO TABS
1.0000 | ORAL_TABLET | Freq: Four times a day (QID) | ORAL | 0 refills | Status: AC | PRN
  Filled 2024-06-10: qty 20, 3d supply, fill #0

## 2024-06-10 MED ORDER — BACLOFEN 10 MG PO TABS
10.0000 mg | ORAL_TABLET | Freq: Two times a day (BID) | ORAL | 1 refills | Status: DC
  Filled 2024-06-10: qty 60, 30d supply, fill #0
  Filled 2024-07-03 – 2024-07-04 (×2): qty 60, 30d supply, fill #1

## 2024-06-10 NOTE — Progress Notes (Signed)
 Discharged to home after IV access removed and discharge instructions reviewed.

## 2024-06-12 ENCOUNTER — Encounter: Payer: Self-pay | Admitting: Neurosurgery

## 2024-06-12 DIAGNOSIS — G932 Benign intracranial hypertension: Secondary | ICD-10-CM

## 2024-06-16 NOTE — Telephone Encounter (Signed)
 CT is scheduled for tomorrow morning and following up with Dr Janjua after

## 2024-06-16 NOTE — Telephone Encounter (Signed)
 Orders placed. Rosaline, can you call her and schedule an appointment. I ordered her imaging to be done at [facility] point since this is the best bet for so close to this appointment. Happy to help coordinate the imaging appointment if needed.

## 2024-06-16 NOTE — Telephone Encounter (Signed)
 Patient has a had constant Motion sickness when moving. She vomits if she is in the car.   This began 2 days after surgery. She denies headaches but still has pain behind the right eye that is 3/10 without medication.   I told her I would send this on to Dr. Janjua for review and that he may want to see her tomorrow but that I would let her know.

## 2024-06-17 ENCOUNTER — Other Ambulatory Visit (HOSPITAL_BASED_OUTPATIENT_CLINIC_OR_DEPARTMENT_OTHER): Payer: Self-pay

## 2024-06-17 ENCOUNTER — Encounter: Payer: Self-pay | Admitting: Neurosurgery

## 2024-06-17 ENCOUNTER — Encounter: Admitting: Physical Medicine & Rehabilitation

## 2024-06-17 ENCOUNTER — Ambulatory Visit (HOSPITAL_BASED_OUTPATIENT_CLINIC_OR_DEPARTMENT_OTHER): Admission: RE | Admit: 2024-06-17 | Discharge: 2024-06-17 | Attending: Neurosurgery | Admitting: Neurosurgery

## 2024-06-17 ENCOUNTER — Ambulatory Visit: Admitting: Neurosurgery

## 2024-06-17 VITALS — BP 137/90 | HR 75 | Temp 98.9°F | Ht 63.0 in | Wt 216.0 lb

## 2024-06-17 DIAGNOSIS — R42 Dizziness and giddiness: Secondary | ICD-10-CM | POA: Diagnosis not present

## 2024-06-17 DIAGNOSIS — G932 Benign intracranial hypertension: Secondary | ICD-10-CM

## 2024-06-17 MED ORDER — ONDANSETRON 4 MG PO TBDP
4.0000 mg | ORAL_TABLET | Freq: Three times a day (TID) | ORAL | 0 refills | Status: AC | PRN
  Filled 2024-06-17: qty 20, 7d supply, fill #0

## 2024-06-17 NOTE — Progress Notes (Signed)
 38 year old lady with idiopathic intracranial hypertension and progressive visual loss.  We shunted her last week.  She called me and told me that she is having progressive nausea and vomiting in addition to severe vertigo.  Today I talked to her and she says that the nausea is severe and she is vomiting frequently.  I explained to her that every time she vomits she squeezes CSF into her abdomen and then this can in fact cause more nausea as well.  In addition to that, she has a new tubing in her abdomen that could be irritating her belly as well.  Her CT shows that her ventricles are smaller.  I would like for her to wait before dialing the valve up and she is willing to give it another few days.  She is drinking enough water  and will continue to do so.

## 2024-06-20 ENCOUNTER — Ambulatory Visit: Admitting: Neurosurgery

## 2024-06-20 ENCOUNTER — Encounter: Payer: Self-pay | Admitting: Neurosurgery

## 2024-06-20 VITALS — BP 122/85 | HR 78 | Temp 97.8°F | Ht 63.0 in | Wt 216.8 lb

## 2024-06-20 DIAGNOSIS — G932 Benign intracranial hypertension: Secondary | ICD-10-CM

## 2024-06-20 NOTE — Progress Notes (Signed)
 38 year old lady with idiopathic intracranial hypertension with visual compromise.  Patient underwent placement of a right-sided ventriculoperitoneal shunt and I saw her earlier in the week as she was complaining about severe dizziness.  She returns today and is doing much better: She states that the dizziness has gotten significantly better but it only persists when she quickly looks to the right or left.  She is also not able to drive in the car without feeling severely nauseous.  The abdominal pain is also getting better and only happens a few times a day and this is manageable.  We went over the do's and don'ts explicitly and I removed her staples today.  I will see her back in 6 weeks.

## 2024-06-24 ENCOUNTER — Encounter: Admitting: Neurosurgery

## 2024-06-24 ENCOUNTER — Telehealth: Payer: Self-pay

## 2024-06-24 NOTE — Telephone Encounter (Signed)
 Patient called about her symptoms. She started having tunnel like sounding hearing and a throbbing headache last night.  She says that when listening to others they sound like robots. She has ear issues already but was concern with this occurring concurrently with her headache  It has not gotten better throughout today. She states that Tylenol  has usually helped her headaches but not today.   Her headaches she rates 7/10.  Her headache is at the top of her head around the hairline. She states that this headache is different than other headaches she has had as it as more behind her eyes.  She denies vision changes.   She does not report any other symptoms at this time.   She wants to know if these symptoms are concerning or normal for her at this point in her recovery.

## 2024-06-25 NOTE — Telephone Encounter (Signed)
 Returned call to patient and scheduled office visit for Friday

## 2024-06-27 ENCOUNTER — Encounter: Payer: Self-pay | Admitting: Neurosurgery

## 2024-06-27 ENCOUNTER — Ambulatory Visit: Admitting: Neurosurgery

## 2024-06-27 VITALS — BP 131/91 | HR 73 | Temp 98.4°F | Ht 63.0 in | Wt 216.0 lb

## 2024-06-27 DIAGNOSIS — G932 Benign intracranial hypertension: Secondary | ICD-10-CM

## 2024-06-27 DIAGNOSIS — Z982 Presence of cerebrospinal fluid drainage device: Secondary | ICD-10-CM

## 2024-06-27 NOTE — Progress Notes (Signed)
 38 year old lady with idiopathic intracranial hypertension with a valve set at 150.  She says that she is having some muffled hearing with the people sounding more like robots.  As the day goes along, her headaches get worse and she even gets blurry vision.  She is having word finding difficulties as well.  Shared with her that more likely than not she is having some over drainage and I dialed the valve up to a setting of 160.  She would let me know how this works.  Her incisions look good.  She says that her abdomen is feeling much better.

## 2024-06-29 LAB — CSF CULTURE W GRAM STAIN
MICRO NUMBER:: 17206255
Result:: NO GROWTH
SPECIMEN QUALITY:: ADEQUATE

## 2024-06-29 LAB — PROTEIN, CSF: Total Protein, CSF: 40 mg/dL (ref 15–45)

## 2024-06-29 LAB — CSF CELL COUNT WITH DIFFERENTIAL
RBC Count, CSF: 3 {cells}/uL — ABNORMAL HIGH
TOTAL NUCLEATED CELL: 2 {cells}/uL (ref 0–5)

## 2024-06-29 LAB — GLUCOSE, CSF: Glucose, CSF: 59 mg/dL (ref 40–80)

## 2024-06-30 ENCOUNTER — Ambulatory Visit: Admitting: Family Medicine

## 2024-06-30 ENCOUNTER — Encounter: Payer: Self-pay | Admitting: Family Medicine

## 2024-06-30 VITALS — BP 115/78 | HR 84 | Temp 98.3°F | Ht 63.0 in | Wt 212.0 lb

## 2024-06-30 DIAGNOSIS — Z6837 Body mass index (BMI) 37.0-37.9, adult: Secondary | ICD-10-CM | POA: Diagnosis not present

## 2024-06-30 DIAGNOSIS — G932 Benign intracranial hypertension: Secondary | ICD-10-CM

## 2024-06-30 DIAGNOSIS — I10 Essential (primary) hypertension: Secondary | ICD-10-CM | POA: Diagnosis not present

## 2024-06-30 NOTE — Progress Notes (Signed)
 Office: [phone]  /  Fax: [phone]   Initial Visit  Helen Wells was seen in clinic today to evaluate for obesity. She is interested in losing weight to improve overall health and reduce the risk of weight related complications. She presents today to review program treatment options, initial physical assessment, and evaluation.     She was referred by: Specialist  When asked what else they would like to accomplish? She states: Adopt a healthier eating pattern and lifestyle, Improve energy levels and physical activity, Improve existing medical conditions, Reduce number of medications, and Improve quality of life  Weight history:  lost weight to 160 lb from ages 15-->20 lives in [place] and lost weight.  Gained weight with pregnancy 2009, 2010.  Started with PIH.  Diagnosed with IIH at age 38 (with vision changes).  Shunt placed 3 weeks ago.    When asked how has your weight affected you? She states: Contributed to medical problems  Some associated conditions: Hypertension and Other: long periods with copper T IUD  Contributing factors: family history of obesity, moderate to high levels of stress, chronic skipping of meals, and multiple weight loss attempts in the past  Weight promoting medications identified: None  Current nutrition plan: None  Current level of physical activity: None Walking prior to shunt placement  Current or previous pharmacotherapy: None  Response to medication: Never tried medications   Past medical history includes:   Past Medical History:  Diagnosis Date   ADHD (attention deficit hyperactivity disorder)    Anemia    Anemia    Anxiety    Clotting disorder    Headache    Hyperlipemia    Hypertension      Objective:   BP 115/78   Pulse 84   Temp 98.3 F (36.8 C)   Ht 5' 3 (1.6 m)   Wt 212 lb (96.2 kg)   LMP 05/20/2024 (Approximate)   SpO2 100%   BMI 37.55 kg/m  She was weighed on the bioimpedance scale: Body mass index  is 37.55 kg/m.  Peak Weight:240 , Body Fat%:44.7, Visceral Fat Rating:11, Weight trend over the last 12 months: Increasing  General:  Alert, oriented and cooperative. Patient is in no acute distress.  Respiratory: Normal respiratory effort, no problems with respiration noted   Gait: able to ambulate independently  Mental Status: Normal mood and affect. Normal behavior. Normal judgment and thought content.   DIAGNOSTIC DATA REVIEWED:  BMET    Component Value Date/Time   NA 139 06/03/2024 1556   NA 141 10/02/2022 0833   K 3.5 06/03/2024 1556   CL 102 06/03/2024 1556   CO2 24 06/03/2024 1556   GLUCOSE 98 06/03/2024 1556   BUN 9 06/03/2024 1556   BUN 12 10/02/2022 0833   CREATININE 0.98 06/03/2024 1556   CALCIUM 8.9 06/03/2024 1556   GFRNONAA >60 06/03/2024 1556   Lab Results  Component Value Date   HGBA1C 5.7 03/14/2023   No results found for: INSULIN CBC    Component Value Date/Time   WBC 7.7 06/03/2024 1556   RBC 4.32 06/03/2024 1556   HGB 12.2 06/03/2024 1556   HGB 13.0 11/15/2023 1504   HCT 37.2 06/03/2024 1556   HCT 40.1 11/15/2023 1504   PLT 314 06/03/2024 1556   PLT 385 11/15/2023 1504   MCV 86.1 06/03/2024 1556   MCV 94 11/15/2023 1504   MCH 28.2 06/03/2024 1556   MCHC 32.8 06/03/2024 1556   RDW 13.5 06/03/2024 1556   RDW  14.6 11/15/2023 1504   Iron/TIBC/Ferritin/ %Sat    Component Value Date/Time   IRON 60 11/15/2023 1504   TIBC 395 11/15/2023 1504   FERRITIN 15 11/15/2023 1504   IRONPCTSAT 15 11/15/2023 1504   IRONPCTSAT 16 04/07/2022 1539   Lipid Panel     Component Value Date/Time   CHOL 204 (H) 05/16/2024 1315   TRIG 151.0 (H) 05/16/2024 1315   HDL 41.30 05/16/2024 1315   CHOLHDL 5 05/16/2024 1315   VLDL 30.2 05/16/2024 1315   LDLCALC 132 (H) 05/16/2024 1315   Hepatic Function Panel     Component Value Date/Time   PROT 7.2 06/03/2024 1556   ALBUMIN 3.5 06/03/2024 1556   AST 17 06/03/2024 1556   ALT 20 06/03/2024 1556   ALKPHOS 79  06/03/2024 1556   BILITOT 0.5 06/03/2024 1556   BILIDIR 0.3 (H) 07/17/2020 0830   IBILI 0.3 07/17/2020 0830      Component Value Date/Time   TSH 3.500 11/15/2023 1504   TSH 2.58 09/20/2023 1541     Assessment and Plan:   IIH (idiopathic intracranial hypertension) S/p shunt placement 3 weeks ago with Dr Janjua with headache improvements Hx of IIH since age 38 Continue Diamox  500 mg bid, Baclofen  10 mg bid, and begin active plan for weight loss Avoid heavy lifting for exercise  Morbid obesity (HCC)  BMI 37.0-37.9, adult  Primary hypertension BP is well controlled on lisinopril  20 mg daily Avoid use of AOMs that may worsen HTN or IIH       Obesity Treatment / Action Plan:  Patient will work on garnering support from family and friends to begin weight loss journey. Will work on eliminating or reducing the presence of highly palatable, calorie dense foods in the home. Will complete provided nutritional and psychosocial assessment questionnaire before the next appointment. Will be scheduled for indirect calorimetry to determine resting energy expenditure in a fasting state.  This will allow us  to create a reduced calorie, high-protein meal plan to promote loss of fat mass while preserving muscle mass. Will think about ideas on how to incorporate physical activity into their daily routine. Was counseled on nutritional approaches to weight loss and benefits of reducing processed foods and consuming plant-based foods and high quality protein as part of nutritional weight management. Was counseled on pharmacotherapy and role as an adjunct in weight management.   Obesity Education Performed Today:  She was weighed on the bioimpedance scale and results were discussed and documented in the synopsis.  We discussed obesity as a disease and the importance of a more detailed evaluation of all the factors contributing to the disease.  We discussed the importance of long term lifestyle  changes which include nutrition, exercise and behavioral modifications as well as the importance of customizing this to her specific health and social needs.  We discussed the benefits of reaching a healthier weight to alleviate the symptoms of existing conditions and reduce the risks of the biomechanical, metabolic and psychological effects of obesity.  Arrington A Copus appears to be in the action stage of change and states they are ready to start intensive lifestyle modifications and behavioral modifications.  20 minutes was spent today on this visit including the above counseling, pre-visit chart review, and post-visit documentation.  Reviewed by clinician on day of visit: allergies, medications, problem list, medical history, surgical history, family history, social history, and previous encounter notes pertinent to obesity diagnosis.    Darice Haddock, D.O. DABFM, DABOM [facility]  [address] [place], [place] [address] [phone]

## 2024-07-15 ENCOUNTER — Other Ambulatory Visit (HOSPITAL_BASED_OUTPATIENT_CLINIC_OR_DEPARTMENT_OTHER): Payer: Self-pay

## 2024-07-16 ENCOUNTER — Other Ambulatory Visit (HOSPITAL_BASED_OUTPATIENT_CLINIC_OR_DEPARTMENT_OTHER): Payer: Self-pay

## 2024-07-16 ENCOUNTER — Other Ambulatory Visit: Payer: Self-pay | Admitting: Family Medicine

## 2024-07-18 ENCOUNTER — Other Ambulatory Visit (HOSPITAL_BASED_OUTPATIENT_CLINIC_OR_DEPARTMENT_OTHER): Payer: Self-pay

## 2024-07-18 MED ORDER — LISINOPRIL 20 MG PO TABS
20.0000 mg | ORAL_TABLET | Freq: Every day | ORAL | 1 refills | Status: AC
  Filled 2024-07-18 – 2024-07-30 (×2): qty 90, 90d supply, fill #0

## 2024-07-30 ENCOUNTER — Other Ambulatory Visit (HOSPITAL_BASED_OUTPATIENT_CLINIC_OR_DEPARTMENT_OTHER): Payer: Self-pay

## 2024-08-01 ENCOUNTER — Other Ambulatory Visit (HOSPITAL_BASED_OUTPATIENT_CLINIC_OR_DEPARTMENT_OTHER): Payer: Self-pay

## 2024-08-01 ENCOUNTER — Ambulatory Visit (INDEPENDENT_AMBULATORY_CARE_PROVIDER_SITE_OTHER): Admitting: Neurosurgery

## 2024-08-01 ENCOUNTER — Encounter: Payer: Self-pay | Admitting: Neurosurgery

## 2024-08-01 VITALS — BP 118/82 | HR 75 | Temp 97.8°F | Ht 63.0 in | Wt 214.2 lb

## 2024-08-01 DIAGNOSIS — G932 Benign intracranial hypertension: Secondary | ICD-10-CM

## 2024-08-01 DIAGNOSIS — Z4889 Encounter for other specified surgical aftercare: Secondary | ICD-10-CM

## 2024-08-01 DIAGNOSIS — Z982 Presence of cerebrospinal fluid drainage device: Secondary | ICD-10-CM

## 2024-08-01 NOTE — Progress Notes (Signed)
 The patient presents to the clinic today for follow up on a recent shunt adjustment from 150 to 160 following.   The patient reports feeling content and is currently not experiencing headaches, blurry vision, or muffled hearing following the shunt adjustment.   She did report a 2-week period between January 1 and January 14 where she had some muffled hearing but remember Dr. Rosslyn was advised to increase hydration, and the muffled hearing resolved.  She has had complete resolution of all symptoms for the past 2 days.  She states I had forgotten what it was like to not have a headache.

## 2024-08-01 NOTE — Progress Notes (Signed)
 39 year old lady with idiopathic intracranial hypertension who underwent placement of a right sided ventriculoperitoneal shunt.  Patient tolerated the procedure well and at her last visit, we had dialed the valve up to a setting of 160.  She returns today and she says that she has complete return of her quality of life.  She has no problems anymore.  She had 2-week episode of some muffled hearing but after drinking some water  it got better.  She is still on 500 twice daily of Diamox  and we decided that after about 3 months she was going to get back in touch with me at which point we will very slowly taper it off.  She is going to follow-up with me on an as-needed basis as per her choice.

## 2024-08-03 ENCOUNTER — Encounter (HOSPITAL_COMMUNITY): Payer: Self-pay

## 2024-08-03 ENCOUNTER — Emergency Department (HOSPITAL_COMMUNITY)
Admission: EM | Admit: 2024-08-03 | Discharge: 2024-08-03 | Disposition: A | Discharge status: Home / Self Care | Attending: Emergency Medicine | Admitting: Emergency Medicine

## 2024-08-03 ENCOUNTER — Emergency Department (HOSPITAL_COMMUNITY)

## 2024-08-03 ENCOUNTER — Other Ambulatory Visit: Payer: Self-pay

## 2024-08-03 DIAGNOSIS — R42 Dizziness and giddiness: Secondary | ICD-10-CM | POA: Insufficient documentation

## 2024-08-03 LAB — COMPREHENSIVE METABOLIC PANEL WITH GFR
ALT: 19 U/L (ref 0–44)
AST: 14 U/L — ABNORMAL LOW (ref 15–41)
Albumin: 4 g/dL (ref 3.5–5.0)
Alkaline Phosphatase: 84 U/L (ref 38–126)
Anion gap: 11 (ref 5–15)
BUN: 19 mg/dL (ref 6–20)
CO2: 20 mmol/L — ABNORMAL LOW (ref 22–32)
Calcium: 8.8 mg/dL — ABNORMAL LOW (ref 8.9–10.3)
Chloride: 110 mmol/L (ref 98–111)
Creatinine, Ser: 0.98 mg/dL (ref 0.44–1.00)
GFR, Estimated: >60 mL/min
Glucose, Bld: 109 mg/dL — ABNORMAL HIGH (ref 70–99)
Potassium: 4.1 mmol/L (ref 3.5–5.1)
Sodium: 141 mmol/L (ref 135–145)
Total Bilirubin: 0.2 mg/dL (ref 0.0–1.2)
Total Protein: 6.8 g/dL (ref 6.5–8.1)

## 2024-08-03 LAB — URINALYSIS, ROUTINE W REFLEX MICROSCOPIC
Bilirubin Urine: NEGATIVE
Glucose, UA: NEGATIVE mg/dL
Hgb urine dipstick: NEGATIVE
Ketones, ur: NEGATIVE mg/dL
Leukocytes,Ua: NEGATIVE
Nitrite: NEGATIVE
Protein, ur: NEGATIVE mg/dL
Specific Gravity, Urine: 1.012 (ref 1.005–1.030)
pH: 5 (ref 5.0–8.0)

## 2024-08-03 LAB — CBC
HCT: 36.1 % (ref 36.0–46.0)
Hemoglobin: 11.2 g/dL — ABNORMAL LOW (ref 12.0–15.0)
MCH: 27.5 pg (ref 26.0–34.0)
MCHC: 31 g/dL (ref 30.0–36.0)
MCV: 88.7 fL (ref 80.0–100.0)
Platelets: 292 K/uL (ref 150–400)
RBC: 4.07 MIL/uL (ref 3.87–5.11)
RDW: 15.1 % (ref 11.5–15.5)
WBC: 7.9 K/uL (ref 4.0–10.5)
nRBC: 0 % (ref 0.0–0.2)

## 2024-08-03 LAB — HCG, SERUM, QUALITATIVE: Preg, Serum: NEGATIVE

## 2024-08-03 LAB — CBG MONITORING, ED: Glucose-Capillary: 99 mg/dL (ref 70–99)

## 2024-08-03 MED ORDER — MECLIZINE HCL 25 MG PO TABS: 25.0000 mg | ORAL_TABLET | Freq: Three times a day (TID) | ORAL | 0 refills | Status: AC | PRN

## 2024-08-03 MED ORDER — DIAZEPAM 2 MG PO TABS
2.0000 mg | ORAL_TABLET | Freq: Once | ORAL | Status: AC
  Administered 2024-08-03: 2 mg via ORAL
  Filled 2024-08-03: qty 1

## 2024-08-03 MED ORDER — MECLIZINE HCL 25 MG PO TABS
25.0000 mg | ORAL_TABLET | Freq: Once | ORAL | Status: AC
  Administered 2024-08-03: 25 mg via ORAL
  Filled 2024-08-03: qty 1

## 2024-08-03 MED ORDER — ONDANSETRON 4 MG PO TBDP
4.0000 mg | ORAL_TABLET | Freq: Once | ORAL | Status: DC
  Filled 2024-08-03: qty 1

## 2024-08-03 NOTE — ED Triage Notes (Signed)
 In addition to initial triage, pt reports she had some nausea last night and the dizziness is causing her to have a unsteady gate. Pt had a VP shunt place 8 weeks ago. Denies headache. No focal neurological deficits noted. Pt is AxOx4.

## 2024-08-03 NOTE — ED Triage Notes (Signed)
 Pt bib pov c/o dizziness and tiredness since last night. Pt states she recently had a surgery and visited her provider Friday.   Pt states the room seems like it is spinning and off balance.

## 2024-08-03 NOTE — ED Provider Notes (Signed)
 " [facility] Provider Note   CSN: [id number] Arrival date & time: 08/03/24  0827     Patient presents with: Dizziness   Helen Wells is a 39 y.o. female with a history of idiopathic intracranial hypertension with a recent VP shunt that was placed 8 weeks ago presents with dizziness x 12 hours.  Patient states that yesterday evening she developed significant dizziness.  Patient denies any known causative factors and states it came on spontaneously.  Patient states that the dizziness is is worse with movement, and she feels like she is unsteady on her feet.  She states that she had experienced something similar postsurgery however was not this severe.  Patient endorses wax/wane nausea associated with the dizziness.  She states that she saw neurosurgery on Friday and was cleared by neuroteam.  Patient endorses near syncope however has not had any syncopal episodes, hit her head, or has had any known injuries.  Patient denies any headaches, no numbness, tingling, weakness, no focal deficits.  She states that her vision is blurry, however that is her normal and she has had no change from baseline. The patient is in no acute distress.    Dizziness      Prior to Admission medications  Medication Sig Start Date End Date Taking? Authorizing Provider  meclizine  (ANTIVERT ) 25 MG tablet Take 1 tablet (25 mg total) by mouth 3 (three) times daily as needed for up to 7 days for dizziness. 08/03/24 08/10/24 Yes Brissia Delisa L, PA  acetaminophen  (MIDOL) 650 MG CR tablet Take 650 mg by mouth every 8 (eight) hours as needed for pain.    [provider]  acetaZOLAMIDE  (DIAMOX ) 250 MG tablet Take 2 tablets (500 mg total) by mouth 2 (two) times daily. 08/08/24   Peickert, Jayson PARAS, NP  amitriptyline  (ELAVIL ) 25 MG tablet Take 1 tablet (25 mg total) by mouth at bedtime. 03/13/24   Frann Mabel Mt, DO  b complex vitamins capsule Take 1 capsule by  mouth daily.    [provider]  butalbital -acetaminophen -caffeine  (FIORICET ) 50-325-40 MG tablet Take 1-2 tablets by mouth every 6 (six) hours as needed for headache. 06/10/24 06/10/25  Janjua, Rashid M, MD  cyanocobalamin  (VITAMIN B12) 500 MCG tablet Take 500 mcg by mouth daily.    [provider]  ferrous sulfate 324 MG TBEC Take 324 mg by mouth.    [provider]  folic acid  (FOLVITE ) 400 MCG tablet Take 400 mcg by mouth daily.    [provider]  lisinopril  (ZESTRIL ) 20 MG tablet Take 1 tablet (20 mg total) by mouth daily. 07/18/24   Frann Mabel Mt, DO  ondansetron  (ZOFRAN -ODT) 4 MG disintegrating tablet Take 1 tablet (4 mg total) by mouth every 8 (eight) hours as needed for nausea or vomiting. 06/17/24   Janjua, Rashid M, MD  rizatriptan  (MAXALT -MLT) 10 MG disintegrating tablet Take 1 tablet (10 mg total) by mouth as needed for migraine. May repeat in 2 hours if needed.  Maximum 2 tablets in 24 hours. 05/29/24   Skeet Juliene SAUNDERS, DO    Allergies: Bupivacaine -meloxicam er, Erythromycin, Mobic [meloxicam], Tetracyclines & related, Erythromycin base, Gluten meal, Macrolides and ketolides, and Soy allergy (obsolete)    Review of Systems  Neurological:  Positive for dizziness.    Updated Vital Signs BP 123/86   Pulse 82   Temp 98.1 F (36.7 C) (Oral)   Resp 14   SpO2 100%   Physical Exam Vitals and  nursing note reviewed.  Constitutional:      General: She is not in acute distress.    Appearance: Normal appearance.  HENT:     Head: Normocephalic and atraumatic.  Eyes:     Extraocular Movements: Extraocular movements intact.     Conjunctiva/sclera: Conjunctivae normal.     Pupils: Pupils are equal, round, and reactive to light.  Cardiovascular:     Rate and Rhythm: Normal rate and regular rhythm.     Pulses: Normal pulses.  Pulmonary:     Effort: Pulmonary effort is normal. No respiratory distress.     Comments: Patient has no difficulty  speaking in complete sentences. Abdominal:     General: Abdomen is flat.     Palpations: Abdomen is soft.     Tenderness: There is no abdominal tenderness.  Musculoskeletal:        General: Normal range of motion.     Cervical back: Normal range of motion.  Skin:    General: Skin is warm and dry.     Capillary Refill: Capillary refill takes less than 2 seconds.  Neurological:     General: No focal deficit present.     Mental Status: She is alert. Mental status is at baseline.     Comments: Patient alert and oriented.  Speech clear and appropriate.  No aphasia or dysarthria.   Cranial nerves III through XII intact: Motor strength 5-5 in all extremities with normal tone and no pronator drift.   Sensation intact to light touch in upper and lower extremities bilaterally.  Coordination normal with intact finger-nose. No focal neurologic deficits appreciated.   Psychiatric:        Mood and Affect: Mood normal.     (all labs ordered are listed, but only abnormal results are displayed) Labs Reviewed  COMPREHENSIVE METABOLIC PANEL WITH GFR - Abnormal; Notable for the following components:      Result Value   CO2 20 (*)    Glucose, Bld 109 (*)    Calcium 8.8 (*)    AST 14 (*)    All other components within normal limits  CBC - Abnormal; Notable for the following components:   Hemoglobin 11.2 (*)    All other components within normal limits  URINALYSIS, ROUTINE W REFLEX MICROSCOPIC - Abnormal; Notable for the following components:   APPearance HAZY (*)    All other components within normal limits  HCG, SERUM, QUALITATIVE  CBG MONITORING, ED    EKG: EKG Interpretation Date/Time:  Sunday August 03 2024 08:38:23 EST Ventricular Rate:  90 PR Interval:  178 QRS Duration:  92 QT Interval:  362 QTC Calculation: 442 R Axis:   55  Text Interpretation: Normal sinus rhythm with sinus arrhythmia Cannot rule out Anterior infarct , age undetermined Abnormal ECG When compared with ECG of  23-Jul-2023 14:40, PREVIOUS ECG IS PRESENT Confirmed by Midge Golas ([id number]) on 08/04/2024 2:31:21 PM  Radiology: No results found.    Procedures   Medications Ordered in the ED  meclizine  (ANTIVERT ) tablet 25 mg (25 mg Oral Given 08/03/24 1106)  diazepam  (VALIUM ) tablet 2 mg (2 mg Oral Given 08/03/24 1414)                                 Medical Decision Making Amount and/or Complexity of Data Reviewed Labs: ordered.   Patient presents to the ED for: dizziness  This involves an extensive number of treatment options and  is a complaint that carries with it a high risk of complications  Differential diagnosis includes:  CVA/TIA ICH Complications with recently placed shunt - infection/rejection  BPPV  Co-morbid conditions: recently placed VP shunt  Additional history/records obtained and reviewed: Additional history obtained from  husband External records from outside source obtained and reviewed including neurosurgery notes  Clinical Course as of 08/10/24 1346  Sun Aug 03, 2024  0940 Temp: 98.6 F (37 C) Afebrile, vital stable, patient in no acute distress [ML]  0941 CBG monitoring, ED 99 [ML]  0941 CBC(!) Labs per baseline [ML]  0941 normal sinus rhythm [ML]    Clinical Course User Index [ML] Willma Duwaine CROME, PA    Data Reviewed / Actions Taken: Labs ordered/reviewed with my independent interpretation in ED course above. Imaging ordered/reviewed - shunt series was without acute finding. I agree with the radiologists interpretation.  EKG ordered/reviewed with my independent interpretation in ED course above.   Management / Treatments: Patient was given a single dose of meclizine  and diazepam  for symptomatic relief-well-tolerated. Reevaluation of the patient after these medicines showed that the patient had moderate treatment with ED treatment regimen I have reviewed the patients home medicines and have made adjustments as needed  ED Course /  Reassessments: Problem List:  39 year old female presented for dizziness. Initial assessment included history, physical exam, and review of prior medical records. Laboratory studies and imaging were obtained without acute finding. Management included meclizine  and diazepam  for dizziness which patient stated moderate improvement, with ongoing reassessment.  Patient was discussed with neurosurgery team multiple times throughout patient's visit to relay patient's clinical appearance, status, and discuss follow-up care plan. Vital signs were obtained and monitored, and the patient remained stable throughout the stay.  The patient had no change in symptoms or new/worsening vision changes. No red flag features for central vertigo to include gradual onset, vertical/bidirectional or non-fatigable nystagmus, focal neurologic findings on exam (including inability to ambulate, ataxia, dysmetria). Presentation not consistent with an acute CNS infection, vertebral basilar artery insufficiency, cerebellar hemorrhage or infarction, intracranial mass or bleed. 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  Consultations:  Neurosurgery - Clois MD Consult recommendations incorporated into plan: Patient to be seen outpatient in clinic early in the week for re-evaluation   Disposition: Disposition: Discharge with close follow-up with PCP for further evaluation and care  Rationale for disposition: stable for discharge  The disposition plan and rationale were discussed with the patient at the bedside, all questions were addressed, and the patient demonstrated understanding.  This note was produced using Electronics Engineer. While I have reviewed and verified all clinical information, transcription errors may remain.      Final diagnoses:  Dizziness     ED Discharge Orders          Ordered    meclizine  (ANTIVERT ) 25 MG tablet  3 times daily PRN        08/03/24 1520               Willma Duwaine CROME, [place] 08/10/24 1353  "

## 2024-08-03 NOTE — ED Notes (Signed)
 Dc instruction and scripts reviewed with pt no questions or concerns at this time. Will follow up with ENT and Neurosurgery

## 2024-08-03 NOTE — Discharge Instructions (Signed)
 Thank you for visiting the Emergency Department today. It was a pleasure to be part of your healthcare team.   Your were seen today for dizziness  As discussed, you have been prescribed meclizine .  If you have any changes in your dizziness or visual changes please be seen at your nearest emergency department.  You will follow-up with your neurosurgery team early this week.  You also have a ENT referral to be seen for the dizziness   Thank you for trusting us  with your health.

## 2024-08-03 NOTE — ED Notes (Signed)
 Pt ambulated. Pt reports that she is less dizzy when she turns her head but still dizzy wile ambulating

## 2024-08-04 ENCOUNTER — Telehealth: Payer: Self-pay | Admitting: Neurosurgery

## 2024-08-04 NOTE — Telephone Encounter (Signed)
 Just FYI-  Patient called the after hours nurse line requesting an appointment after her ED visit this weekend. I returned call to the patient to offer a follow up appointment with Dr Janjua. She said that after resting she is feeling better and doesn't think a follow up is needed at this time. I advised patient to call me back if needed and we will get her scheduled.

## 2024-08-06 ENCOUNTER — Encounter: Payer: Self-pay | Admitting: Family Medicine

## 2024-08-06 ENCOUNTER — Ambulatory Visit: Admitting: Family Medicine

## 2024-08-06 VITALS — BP 117/83 | HR 88 | Temp 98.1°F | Ht 63.0 in | Wt 208.0 lb

## 2024-08-06 DIAGNOSIS — Z1331 Encounter for screening for depression: Secondary | ICD-10-CM

## 2024-08-06 DIAGNOSIS — G932 Benign intracranial hypertension: Secondary | ICD-10-CM

## 2024-08-06 DIAGNOSIS — D509 Iron deficiency anemia, unspecified: Secondary | ICD-10-CM | POA: Diagnosis not present

## 2024-08-06 DIAGNOSIS — E538 Deficiency of other specified B group vitamins: Secondary | ICD-10-CM | POA: Diagnosis not present

## 2024-08-06 DIAGNOSIS — R5383 Other fatigue: Secondary | ICD-10-CM | POA: Diagnosis not present

## 2024-08-06 DIAGNOSIS — D500 Iron deficiency anemia secondary to blood loss (chronic): Secondary | ICD-10-CM

## 2024-08-06 DIAGNOSIS — R0602 Shortness of breath: Secondary | ICD-10-CM

## 2024-08-06 DIAGNOSIS — I10 Essential (primary) hypertension: Secondary | ICD-10-CM

## 2024-08-06 DIAGNOSIS — Z6836 Body mass index (BMI) 36.0-36.9, adult: Secondary | ICD-10-CM | POA: Diagnosis not present

## 2024-08-06 DIAGNOSIS — E559 Vitamin D deficiency, unspecified: Secondary | ICD-10-CM | POA: Diagnosis not present

## 2024-08-06 NOTE — Patient Instructions (Signed)
 Labs today  Begin using Chat GPT to create a meal plan: 1400 calorie meal plan with 2 meals and 1 snack per day This should include 85 g of protein per day as goal Keep late night snack high protein/ low sugar Allow more fruits and veggies Trade out regular milk in green tea for either [employer] lowfat milk or a vanilla protein shake  Drink plenty of water / keep drinks zero sugar  Aim for 7 hrs of sleep study

## 2024-08-06 NOTE — Discharge Summary (Signed)
 Physician Discharge Summary      Physician Discharge Summary   Patient: Helen Wells MRN: [id number] DOB: 07/02/1986  Admit date:     06/09/2024  Discharge date: 06/10/2024  Discharge Physician: Dino Sable  Disposition: Home  PCP: Frann Mabel Mt, DO   Procedures  RIGHT VP SHUNT - [facility] Hakim at [facility]  Discharge Diagnoses:   Principal Problem:   IIH (idiopathic intracranial hypertension)  Resolved Problems:   * No resolved hospital problems. *   Discharge summary   The patient was admitted on 06/09/2024  for RIGHT VP Shunt placement. The operation was uncomplicated. Postop scan and exam showed no complications. Currently the patient is ambulating, with pain controlled, tolerating diet, and voiding at time of discharge.  The patient has been instructed to follow up with [facility]surgery at [facility] with instructions provided as below. Post-hospitalization questions to be answered by [facility] Neurosurgery at [facility] team.   Discharge Plan     Discharge Instructions      Abdominal Dressing and Wound Care  Keep your wound or incision site clean and dry. You have Dermabond/Durabond (skin glue) over your incisions. This will usually remain in place for 10-14 days, then naturally fall off your skin. You may take a shower 24 hrs after surgery, carefully wash, not scrub the incision site with a mild non-scented soap. Pat dry with a soft towel.  Do not pick or peel skin glue off.  You can shower and let the water  fall on the dressings above. Do not soak or submerge your incision(s) in a bath tub, hot tub, or swimming pool, until your doctor says it is ok to do so or the incision(s) have completely healed, usually about 2-4 weeks.  Do not use creams, powder, salves or balms on your incision(s).     Discharge disposition: 01-Home or Self Care       Discharge Instructions     Diet - low sodium heart healthy   Complete by: As directed     Discharge instructions   Complete by: As directed    Keep incisions on head dry   Driving Restrictions   Complete by: As directed    No driving until staples removed   Increase activity slowly   Complete by: As directed    No dressing needed   Complete by: As directed           Medications at Discharge   Allergies as of 06/10/2024       Reactions   Bupivacaine -meloxicam Er    Other reaction(s): Unknown   Erythromycin    Blind Other reaction(s): Other 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 Other reaction(s): Other Informed by her MD not to take any Tetracyclines. Informed by her MD not to take any Tetracyclines. Blind   Erythromycin Base    Other reaction(s): Other (See Comments) Other    Gluten Meal Diarrhea   Acid reflux/knots in stomach   Macrolides And Ketolides    Other reaction(s): Other (See Comments), Other (See Comments) Blindness, Pseudotumor Cerebri  Blindness, Pseudotumor Cerebri    Soy Allergy (obsolete) Other (See Comments)   Soy free diet        Medication List     TAKE these medications    acetaZOLAMIDE  250 MG  tablet Commonly known as: DIAMOX  Take 2 tablets (500 mg total) by mouth 2 (two) times daily.   amitriptyline  25 MG tablet Commonly known as: ELAVIL  Take 1 tablet (25 mg total) by mouth at bedtime.   b complex vitamins capsule Take 1 capsule by mouth daily.   butalbital -acetaminophen -caffeine  50-325-40 MG tablet Commonly known as: FIORICET  Take 1-2 tablets by mouth every 6 (six) hours as needed for headache.   cyanocobalamin  500 MCG tablet Commonly known as: VITAMIN B12 Take 500 mcg by mouth daily.   ferrous sulfate 324 MG Tbec Take 324 mg by mouth.   folic acid  400 MCG tablet Commonly known as: FOLVITE  Take 400 mcg by mouth  daily.   Midol 650 MG CR tablet Generic drug: acetaminophen  Take 650 mg by mouth every 8 (eight) hours as needed for pain.   rizatriptan  10 MG disintegrating tablet Commonly known as: Maxalt -MLT Take 1 tablet (10 mg total) by mouth as needed for migraine. May repeat in 2 hours if needed.  Maximum 2 tablets in 24 hours.               Discharge Care Instructions  (From admission, onward)           Start     Ordered   06/10/24 0000  No dressing needed        06/10/24 0740             Condition at Discharge   The patient appears to be calm in no distress.   BP 119/78 (BP Location: Right Arm)   Pulse 88   Temp 98.4 F (36.9 C)   Resp 16   Ht 5' 3 (1.6 m)   Wt 99.8 kg   LMP 05/20/2024 (Approximate)   SpO2 100%   BMI 38.97 kg/m   Physical Exam  HENT: Incisions dry Head: Normocephalic.  Nose: Nose normal.  Eyes:  Pupils: Pupils are equal, round, and reactive to light.   Cardiovascular:  Rate and Rhythm: Normal. Pulmonary:  Effort: Pulmonary effort is normal. Lungs are clear. Abdominal:  General: Abdomen is flat. Soft. Denies pain.  Musculoskeletal:  Cervical back: Normal range of motion.  Neurological: Intact Mental Status: Patient is alert.  Cranial Nerves: Cranial nerves 2-12 are intact.  Sensory: Sensation is intact.  Motor: Ambulating and moving all 4 extremities. Motor function is intact.  Coordination: Coordination is intact.  Skin: Clean. Incision site looks Clean, Dry, and intact.    Condition at discharge:  Stable         Physician Statement:   The Patient was personally examined, the discharge assessment and plan has been personally reviewed and I agree with ACNP Babcock's assessment and plan. 10 minutes of time have been dedicated to discharge assessment, planning and discharge instructions.    Signed: Francia Verry 08/06/2024, 3:03 PM

## 2024-08-06 NOTE — Progress Notes (Signed)
 "  At a Glance:  Vitals Temp: 98.1 F (36.7 C) BP: 117/83 Pulse Rate: 88 SpO2: 100 %   Anthropometric Measurements Height: 5' 3 (1.6 m) Weight: 208 lb (94.3 kg) BMI (Calculated): 36.85 Starting Weight: 208lb   Body Composition  Body Fat %: 40.7 % Fat Mass (lbs): 84.6 lbs Muscle Mass (lbs): 117.2 lbs Total Body Water  (lbs): 84.6 lbs Visceral Fat Rating : 9   Other Clinical Data Fasting: yes Labs: Yes Today's Visit #: 1 Starting Date: 08/06/24    EKG: Normal sinus rhythm, rate 90 BPM reviewed from 08/03/24  Indirect Calorimeter completed today shows a VO2 of 227 and a REE of 1570.  Her calculated basal metabolic rate is 8283 thus her basal metabolic rate is worse than expected.  Chief Complaint:  Obesity   Subjective:  Helen Wells (MR# [id number]) is a 39 y.o. female who presents for evaluation and treatment of obesity and related comorbidities.   [name] is currently in the action stage of change and ready to dedicate time achieving and maintaining a healthier weight. [name] is interested in becoming our patient and working on intensive lifestyle modifications including (but not limited to) diet and exercise for weight loss.  [name] has been struggling with her weight. She has been unsuccessful in either losing weight, maintaining weight loss, or reaching her healthy weight goal.  She lost weight while living in [place] from ages [age].  She gained weight with pregnancies in 2009 and 2010.  She was diagnosed with intracranial hypertension at age 52 and has had multiple  brain surgeries.  She lives at home with her husband and 2 teenage children and her mom.  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  [name]'s habits were reviewed today and are as follows: she started gaining weight 20 years ago, she has significant food cravings issues, she snacks frequently in the evenings, and she frequently makes poor food choices.  She has regular  fairly heavy menses from her copper T IUD.  She was encouraged to lose additional weight by Dr. Janjua for treatment of intracranial hypertension.  Other Fatigue Adisson admits to daytime somnolence and admits to waking up still tired. Patient has a history of symptoms of daytime fatigue. [name] generally gets 6 hours of sleep per night, and states that she has difficulty falling asleep. Snoring is not present. Apneic episodes is not present. Epworth Sleepiness Score is 7.   Shortness of Breath [name] notes increasing shortness of breath with exercising and seems to be worsening over time with weight gain. She notes getting out of breath sooner with activity than she used to. This has gotten worse recently. [name] denies shortness of breath at rest or orthopnea.   Depression Screen [name]'s Food and Mood (modified PHQ-9) score was 14.     08/06/2024    8:27 AM  Depression screen PHQ 2/9  Decreased Interest 1  Down, Depressed, Hopeless 1  PHQ - 2 Score 2  Altered sleeping 2  Tired, decreased energy 2  Change in appetite 1  Feeling bad or failure about yourself  1  Trouble concentrating 1  Moving slowly or fidgety/restless 0  Suicidal thoughts 0  PHQ-9 Score 9  Difficult doing work/chores Somewhat difficult     Assessment and Plan:   Other Fatigue Weslynn does feel that her weight is causing her energy to be lower than it should be. Fatigue may be related to obesity, depression or many other causes. Labs will be ordered,  and in the meanwhile, Brunetta will focus on self care including making healthy food choices, increasing physical activity and focusing on stress reduction.  Shortness of Breath [name] does feel that she gets out of breath more easily that she used to when she exercises. [name]'s shortness of breath appears to be obesity related and exercise induced. She has agreed to work on weight loss and gradually increase exercise to treat her exercise induced shortness of  breath. Will continue to monitor closely.  [name] had a positive depression screening. Depression is commonly associated with obesity and often results in emotional eating behaviors. We will monitor this closely and work on CBT to help improve the non-hunger eating patterns. Referral to Psychology may be required if no improvement is seen as she continues in our clinic.    Problem List Items Addressed This Visit     Primary hypertension Blood pressure at goal on lisinopril  20 mg once daily.  She notes that her blood pressure did increase with weight gain but has improved some with weight loss.  She notes a positive family history for hypertension.   Iron deficiency anemia She has fairly heavy menses with her copper T IUD and has required ferrous sulfate 324 mg in the past, currently off due to constipating side effects.  Check iron levels today.  Her last hemoglobin was fairly low at 11.2 on 08/03/2024.   Relevant Orders   Ferritin   Iron and TIBC   IIH (idiopathic intracranial hypertension) Managed by Dr. Janjua, she is status post right sided VP shunt currently on Diamox  500 mg twice daily.  Her headaches have greatly improved.  She denies any current issues with vision change and has a visit coming up to see ENT for her vertigo.  Continue active plan for weight reduction.   Other Visit Diagnoses       SOBOE (shortness of breath on exertion)    -  Primary     Other fatigue       Relevant Orders   TSH   T4, free   T3   Insulin , random   Hemoglobin A1c     Depression screen         Vitamin D  deficiency     She is currently off a vitamin D  supplement.  Check labs today   Relevant Orders   VITAMIN D  25 Hydroxy (Vit-D Deficiency, Fractures)     Low serum vitamin B12     Last B12 level was low.  She has complaints of fatigue.  She is no longer taking 500 mcg of B12.  She denies paresthesias.  She has not required injectable B12 in the past.   Relevant Orders   Vitamin B12     Low  folate    Last folic acid  level was low.  She is currently not taking a folic acid  supplement complains of fatigue.  Check level today.  Morbid obesity BMI 36 She is actively losing weight and has lost about 30 pounds this past year with calorie tracking.  She denies issues of excessive appetite.  Encouraged using chat GPT app and reviewed results from her indirect calorimetry today.  Will be focusing on improving nutrition including lean protein and fiber intake with meals, improving sleep at night and improving levels of physical activity as she is feeling better.             Shamecca is currently in the action stage of change and her goal is to get back to weightloss  efforts . I recommend Andrea begin the structured treatment plan as follows:  She has agreed to keeping a food journal and adhering to recommended goals of 1400 calories and 85 g of protein May customize a food plan using chat GPT app with 1400 cal to include 2 meals and 1 snack of a gluten-free diet per her request.  Exercise goals: No exercise has been prescribed at this time. Hold off on adding an additional exercise until vertigo has resolved  Behavioral modification strategies:increasing lean protein intake, increase H2O intake, decrease liquid calories, increase high fiber foods, keeping healthy foods in the home, better snacking choices, avoiding temptations, and decrease junk food   She was informed of the importance of frequent follow-up visits to maximize her success with intensive lifestyle modifications for her multiple health conditions. She was informed we would discuss her lab results at her next visit unless there is a critical issue that needs to be addressed sooner. Latessa agreed to keep her next visit at the agreed upon time to discuss these results.  Objective:  General: Cooperative, alert, well developed, in no acute distress. HEENT: Conjunctivae and lids unremarkable. Cardiovascular: Regular rhythm.   Lungs: Normal work of breathing. Neurologic: No focal deficits.   Lab Results  Component Value Date   CREATININE 0.98 08/03/2024   BUN 19 08/03/2024   NA 141 08/03/2024   K 4.1 08/03/2024   CL 110 08/03/2024   CO2 20 (L) 08/03/2024   Lab Results  Component Value Date   ALT 19 08/03/2024   AST 14 (L) 08/03/2024   ALKPHOS 84 08/03/2024   BILITOT 0.2 08/03/2024   Lab Results  Component Value Date   HGBA1C 5.7 03/14/2023   No results found for: INSULIN  Lab Results  Component Value Date   TSH 3.500 11/15/2023   Lab Results  Component Value Date   CHOL 204 (H) 05/16/2024   HDL 41.30 05/16/2024   LDLCALC 132 (H) 05/16/2024   TRIG 151.0 (H) 05/16/2024   CHOLHDL 5 05/16/2024   Lab Results  Component Value Date   WBC 7.9 08/03/2024   HGB 11.2 (L) 08/03/2024   HCT 36.1 08/03/2024   MCV 88.7 08/03/2024   PLT 292 08/03/2024   Lab Results  Component Value Date   IRON 60 11/15/2023   TIBC 395 11/15/2023   FERRITIN 15 11/15/2023    Attestation Statements:  Reviewed by clinician on day of visit: allergies, medications, problem list, medical history, surgical history, family history, social history, and previous encounter notes.  Time spent on visit including pre-visit chart review and post-visit charting and face- to face care including nutritional counseling, review of EKG, interpretation of body composition scale and indirect calorimetry and nutrition prescription  was 42 minutes.   Darice Haddock, D.O. DABFM, DABOM [facility] [address] [place], [place] [address] [phone]  "

## 2024-08-07 ENCOUNTER — Ambulatory Visit: Payer: Self-pay | Admitting: Family Medicine

## 2024-08-07 LAB — IRON AND TIBC
Iron Saturation: 15 % (ref 15–55)
Iron: 64 ug/dL (ref 27–159)
Total Iron Binding Capacity: 426 ug/dL (ref 250–450)
UIBC: 362 ug/dL (ref 131–425)

## 2024-08-07 LAB — HEMOGLOBIN A1C
Est. average glucose Bld gHb Est-mCnc: 105 mg/dL
Hgb A1c MFr Bld: 5.3 % (ref 4.8–5.6)

## 2024-08-07 LAB — FOLATE: Folate: 2.3 ng/mL — ABNORMAL LOW

## 2024-08-07 LAB — VITAMIN B12: Vitamin B-12: 335 pg/mL (ref 232–1245)

## 2024-08-07 LAB — VITAMIN D 25 HYDROXY (VIT D DEFICIENCY, FRACTURES): Vit D, 25-Hydroxy: 25.1 ng/mL — ABNORMAL LOW (ref 30.0–100.0)

## 2024-08-07 LAB — T3: T3, Total: 134 ng/dL (ref 71–180)

## 2024-08-07 LAB — INSULIN, RANDOM: INSULIN: 12.8 u[IU]/mL (ref 2.6–24.9)

## 2024-08-07 LAB — TSH: TSH: 3.42 u[IU]/mL (ref 0.450–4.500)

## 2024-08-07 LAB — T4, FREE: Free T4: 1.27 ng/dL (ref 0.82–1.77)

## 2024-08-07 LAB — FERRITIN: Ferritin: 13 ng/mL — ABNORMAL LOW (ref 15–150)

## 2024-08-08 ENCOUNTER — Other Ambulatory Visit: Payer: Self-pay

## 2024-08-08 ENCOUNTER — Telehealth: Payer: Self-pay

## 2024-08-08 ENCOUNTER — Other Ambulatory Visit: Payer: Self-pay | Admitting: Neurosurgery

## 2024-08-08 ENCOUNTER — Other Ambulatory Visit (HOSPITAL_BASED_OUTPATIENT_CLINIC_OR_DEPARTMENT_OTHER): Payer: Self-pay

## 2024-08-08 MED ORDER — ACETAZOLAMIDE 250 MG PO TABS
500.0000 mg | ORAL_TABLET | Freq: Two times a day (BID) | ORAL | 11 refills | Status: AC
  Filled 2024-08-08 (×2): qty 120, 30d supply, fill #0

## 2024-08-08 NOTE — Telephone Encounter (Signed)
 "  SEE OTHER ENCOUNTER  "

## 2024-08-08 NOTE — Telephone Encounter (Addendum)
 Patient called in and stated that she was supposed to receive a refill of her Diamox  at her last appointment. The pharmacy never received a script.  She needs a refill before the weekend.

## 2024-08-08 NOTE — Telephone Encounter (Signed)
 Please do not take if pregnant or possibly pregnant. This medication is toxic to the fetus.

## 2024-08-11 ENCOUNTER — Other Ambulatory Visit (HOSPITAL_COMMUNITY): Payer: Self-pay

## 2024-08-20 ENCOUNTER — Other Ambulatory Visit (HOSPITAL_BASED_OUTPATIENT_CLINIC_OR_DEPARTMENT_OTHER): Payer: Self-pay

## 2024-08-20 ENCOUNTER — Ambulatory Visit: Admitting: Family Medicine

## 2024-08-20 ENCOUNTER — Encounter: Payer: Self-pay | Admitting: Family Medicine

## 2024-08-20 VITALS — BP 117/82 | HR 88 | Temp 98.0°F | Ht 63.0 in | Wt 209.0 lb

## 2024-08-20 DIAGNOSIS — E538 Deficiency of other specified B group vitamins: Secondary | ICD-10-CM

## 2024-08-20 DIAGNOSIS — G932 Benign intracranial hypertension: Secondary | ICD-10-CM | POA: Diagnosis not present

## 2024-08-20 DIAGNOSIS — D500 Iron deficiency anemia secondary to blood loss (chronic): Secondary | ICD-10-CM | POA: Diagnosis not present

## 2024-08-20 DIAGNOSIS — E559 Vitamin D deficiency, unspecified: Secondary | ICD-10-CM | POA: Diagnosis not present

## 2024-08-20 DIAGNOSIS — I10 Essential (primary) hypertension: Secondary | ICD-10-CM | POA: Diagnosis not present

## 2024-08-20 DIAGNOSIS — Z6837 Body mass index (BMI) 37.0-37.9, adult: Secondary | ICD-10-CM

## 2024-08-20 MED ORDER — VITAMIN B-12 100 MCG PO TABS
100.0000 ug | ORAL_TABLET | Freq: Every day | ORAL | 0 refills | Status: DC
  Filled 2024-08-20: qty 100, 100d supply, fill #0

## 2024-08-20 MED ORDER — FOLIC ACID 1 MG PO TABS
1.0000 mg | ORAL_TABLET | ORAL | 1 refills | Status: AC
  Filled 2024-08-20: qty 30, 60d supply, fill #0

## 2024-08-20 MED ORDER — FERROUS GLUCONATE 324 (38 FE) MG PO TABS
324.0000 mg | ORAL_TABLET | Freq: Every day | ORAL | 1 refills | Status: AC
  Filled 2024-08-20: qty 30, 30d supply, fill #0

## 2024-08-20 MED ORDER — VITAMIN B-12 1000 MCG PO TABS
1000.0000 ug | ORAL_TABLET | Freq: Every day | ORAL | 3 refills | Status: AC
  Filled 2024-08-20: qty 30, 30d supply, fill #0

## 2024-08-20 MED ORDER — ZEPBOUND 2.5 MG/0.5ML ~~LOC~~ SOAJ
2.5000 mg | SUBCUTANEOUS | 0 refills | Status: AC
  Filled 2024-08-20: qty 2, 28d supply, fill #0

## 2024-08-20 MED ORDER — VITAMIN D3 125 MCG (5000 UT) PO CHEW
5000.0000 [IU] | CHEWABLE_TABLET | Freq: Every day | ORAL | 2 refills | Status: AC
  Filled 2024-08-20: qty 90, 90d supply, fill #0

## 2024-08-20 NOTE — Progress Notes (Signed)
 "  Office: [phone]  /  Fax: [phone]  WEIGHT SUMMARY AND BIOMETRICS  Starting Date: 08/06/24  Starting Weight: 208lb   Weight Lost Since Last Visit: 0lb   Vitals Temp: 98 F (36.7 C) BP: 117/82 Pulse Rate: 88 SpO2: 99 %   Body Composition  Body Fat %: 44.4 % Fat Mass (lbs): 93 lbs Muscle Mass (lbs): 110.8 lbs Total Body Water  (lbs): 84 lbs Visceral Fat Rating : 11     HPI  Chief Complaint: OBESITY  Helen Wells is here to discuss her progress with her obesity treatment plan. She is on the keeping a food journal and adhering to recommended goals of 1400 calories and 85 protein and states she is following her eating plan approximately 75 % of the time. She states she is exercising 0 minutes 0 times per week.   Interval History:  Since last office visit she is up 1 pound She has been working on an AI created 1400-calorie meal plan Denies excessive hunger or cravings Has a good support system at home Denies increased headaches with a history of intracranial hypertension She has purchased a new smart watch and plans to start tracking both her sleep and her daily steps She has not yet started regular exercise She is frustrated by her weight gain  Pharmacotherapy: None  PHYSICAL EXAM:  Blood pressure 117/82, pulse 88, temperature 98 F (36.7 C), height 5' 3 (1.6 m), weight 209 lb (94.8 kg), SpO2 99%. Body mass index is 37.02 kg/m.  General: She is overweight, cooperative, alert, well developed, and in no acute distress. PSYCH: Has normal mood, affect and thought process.   Lungs: Normal breathing effort, no conversational dyspnea.   ASSESSMENT AND PLAN  TREATMENT PLAN FOR OBESITY:  Recommended Dietary Goals  Helen Wells is currently in the action stage of change. As such, her goal is to continue weight management plan. She has agreed to keeping a food journal and adhering to recommended goals of 1400 calories and 90 g of protein. Resting energy  expenditure: 1570  Behavioral Intervention  We discussed the following Behavioral Modification Strategies today: increasing lean protein intake to established goals, increasing fiber rich foods, increasing water  intake , work on meal planning and preparation, work on tracking and journaling calories using tracking application, keeping healthy foods at home, identifying sources and decreasing liquid calories, work on managing stress, creating time for self-care and relaxation, avoiding temptations and identifying enticing environmental cues, and continue to practice mindfulness when eating.  Additional resources provided today: NA  Recommended Physical Activity Goals  Helen Wells has been advised to work up to 150 minutes of moderate intensity aerobic activity a week and strengthening exercises 2-3 times per week for cardiovascular health, weight loss maintenance and preservation of muscle mass.   She has agreed to Think about enjoyable ways to increase daily physical activity and overcoming barriers to exercise and Increase physical activity in their day and reduce sedentary time (increase NEAT). Begin tracking daily steps with smart watch  Pharmacotherapy changes for the treatment of obesity: Begin Zepbound  2.5 mg once weekly injection Patient denies a personal or family history of pancreatitis, medullary thyroid  carcinoma or multiple endocrine neoplasia type II. Recommend reviewing pen training video online. Form of birth control: IUD  ASSOCIATED CONDITIONS ADDRESSED TODAY  Low folate Lab Results  Component Value Date   FOLATE 2.3 (L) 08/06/2024  New.  Reviewed lab results with patient from last visit.  Her folic acid  level was low.  She has been off her  folic acid  and supplement since brain surgery 10 weeks ago.  Energy levels currently low.  Restart folic acid  at 1 mg every other day.  Repeat lab in 4 months.  -     Folic Acid ; Take 1 tablet (1 mg total) by mouth every other day.   Dispense: 30 tablet; Refill: 1  Low serum vitamin B12 Lab Results  Component Value Date   VITAMINB12 335 08/06/2024   New.  Reviewed labs with patient from last visit.  She is off her vitamin B12 supplement.  She was previously taking 500 mcg once daily but stopped after her brain surgery 10 weeks ago.  Energy level remains low.  She has paresthesias and brain fog but also is on Diamox  which causes the side effects.  Restart vitamin B12 at 1000 mcg once daily. -     Vitamin B-12; Take 1 tablet (1000 mcg total) by mouth daily.  Dispense: 100 tablet; Refill: 0  Iron  deficiency anemia due to chronic blood loss Lab Results  Component Value Date   IRON  64 08/06/2024   TIBC 426 08/06/2024   FERRITIN 13 (L) 08/06/2024  New.  She was previously on ferrous sulfate 324 mg once daily and had some GI side effects.  Energy level is currently low.  Restart prescription iron  changing to ferrous gluconate  324 mg once daily.  Repeat lab in 3 to 4 months.  -     Ferrous Gluconate ; Take 1 tablet (324 mg total) by mouth daily.  Dispense: 30 tablet; Refill: 1  Vitamin D  deficiency Last vitamin D  Lab Results  Component Value Date   VD25OH 25.1 (L) 08/06/2024  New.  Reviewed lab results with patient.  She is currently not on a vitamin D  supplement.  Start vitamin D  5000 IU once daily.  Repeat lab in 3 to 4 months.  -     Vitamin D3; Chew 5,000 Int'l Units by mouth daily.  Dispense: 30 tablet; Refill: 2  Morbid obesity (HCC) -     Zepbound ; Inject 2.5 mg into the skin once a week.  Dispense: 2 mL; Refill: 0 She is a good candidate for use of Zepbound  once weekly injection.  Reviewed [email or web address] for pen training information video.  Patient was counseled on the importance of maintaining healthy lifestyle habits, including balanced nutrition, regular physical activity, and behavioral modifications, while taking antiobesity medication.  Patient verbalized understanding that medication is an adjunct to, not a  replacement for, lifestyle changes and that the long-term success and weight maintenance depend on continued adherence to these strategies.  IIH (idiopathic intracranial hypertension) -     Zepbound ; Inject 2.5 mg into the skin once a week.  Dispense: 2 mL; Refill: 0 Continue plan of care per Dr. Janjua.  Headaches are occurring less frequent.  Primary hypertension -     Zepbound ; Inject 2.5 mg into the skin once a week.  Dispense: 2 mL; Refill: 0 Avoid use of stimulant type antiobesity medications due to hypertension and IIH.  BMI 37.0-37.9, adult      She was informed of the importance of frequent follow up visits to maximize her success with intensive lifestyle modifications for her multiple health conditions.   ATTESTASTION STATEMENTS:  Reviewed by clinician on day of visit: allergies, medications, problem list, medical history, surgical history, family history, social history, and previous encounter notes pertinent to obesity diagnosis.   I have personally spent 40 minutes total time today in preparation, patient care, nutritional counseling and education,  and documentation  for this visit, including the following: review of most recent clinical lab tests, prescribing medications/ refilling medications, reviewing medical assistant documentation, review and interpretation of bioimpedence results.     Darice Haddock, D.O. DABFM, DABOM [facility] [address] [place], [place] [address] [phone] "

## 2024-08-20 NOTE — Patient Instructions (Signed)
 Await PA for Zepbound   Check out [email or web address]  Start current supplements

## 2024-08-21 ENCOUNTER — Telehealth: Payer: Self-pay | Admitting: *Deleted

## 2024-08-21 ENCOUNTER — Other Ambulatory Visit (HOSPITAL_BASED_OUTPATIENT_CLINIC_OR_DEPARTMENT_OTHER): Payer: Self-pay

## 2024-08-21 NOTE — Telephone Encounter (Signed)
 Prior authorization deniedDenied. ZEPBOUND  Soln [employer]-inj for weight loss is excluded by your plan unless medically necessary for the treatment of Class III obesity. Class III obesity means you have a BMI (body mass index) of 40 or more.  for patients Zepbound .

## 2024-08-21 NOTE — Telephone Encounter (Signed)
 Prior authorization done for Zepbound  via cover my meds. Waiting on determination.  Helen Wells (Key: [id number]) Zepbound  2.5MG /0.5ML pen-injectors

## 2024-08-22 ENCOUNTER — Other Ambulatory Visit (HOSPITAL_BASED_OUTPATIENT_CLINIC_OR_DEPARTMENT_OTHER): Payer: Self-pay

## 2024-09-17 ENCOUNTER — Institutional Professional Consult (permissible substitution) (INDEPENDENT_AMBULATORY_CARE_PROVIDER_SITE_OTHER)

## 2024-09-22 ENCOUNTER — Ambulatory Visit: Admitting: Family Medicine

## 2024-09-29 ENCOUNTER — Ambulatory Visit: Admitting: Family Medicine

## 2024-11-13 ENCOUNTER — Ambulatory Visit: Admitting: Neurology

## 2025-04-03 ENCOUNTER — Encounter: Admitting: Family Medicine
